# Patient Record
Sex: Female | Born: 1970 | Race: White | Hispanic: No | Smoking: Former smoker
Health system: Southern US, Community
[De-identification: ages and names within clinical notes are randomized; demographics above are authoritative.]

## PROBLEM LIST (undated history)

## (undated) DIAGNOSIS — F419 Anxiety disorder, unspecified: Secondary | ICD-10-CM

## (undated) DIAGNOSIS — C801 Malignant (primary) neoplasm, unspecified: Secondary | ICD-10-CM

## (undated) DIAGNOSIS — F410 Panic disorder [episodic paroxysmal anxiety] without agoraphobia: Secondary | ICD-10-CM

## (undated) DIAGNOSIS — T8859XA Other complications of anesthesia, initial encounter: Secondary | ICD-10-CM

## (undated) DIAGNOSIS — R112 Nausea with vomiting, unspecified: Secondary | ICD-10-CM

## (undated) DIAGNOSIS — J449 Chronic obstructive pulmonary disease, unspecified: Secondary | ICD-10-CM

## (undated) DIAGNOSIS — K219 Gastro-esophageal reflux disease without esophagitis: Secondary | ICD-10-CM

## (undated) DIAGNOSIS — R519 Headache, unspecified: Secondary | ICD-10-CM

## (undated) DIAGNOSIS — I219 Acute myocardial infarction, unspecified: Secondary | ICD-10-CM

## (undated) DIAGNOSIS — F32A Depression, unspecified: Secondary | ICD-10-CM

## (undated) DIAGNOSIS — F329 Major depressive disorder, single episode, unspecified: Secondary | ICD-10-CM

## (undated) DIAGNOSIS — J45909 Unspecified asthma, uncomplicated: Secondary | ICD-10-CM

## (undated) DIAGNOSIS — Z9889 Other specified postprocedural states: Secondary | ICD-10-CM

## (undated) HISTORY — PX: TUBAL LIGATION: SHX77

## (undated) HISTORY — PX: WRIST SURGERY: SHX841

---

## 2009-09-23 ENCOUNTER — Inpatient Hospital Stay (HOSPITAL_COMMUNITY): Admission: EM | Admit: 2009-09-23 | Discharge: 2009-09-24 | Payer: Self-pay | Admitting: Emergency Medicine

## 2011-02-05 LAB — COMPREHENSIVE METABOLIC PANEL
AST: 18 U/L (ref 0–37)
Albumin: 3.7 g/dL (ref 3.5–5.2)
Chloride: 109 mEq/L (ref 96–112)
Creatinine, Ser: 0.84 mg/dL (ref 0.4–1.2)
GFR calc Af Amer: >60 mL/min (ref >60)
Total Bilirubin: 0.7 mg/dL (ref 0.3–1.2)
Total Protein: 6.4 g/dL (ref 6.0–8.3)

## 2011-02-05 LAB — DIFFERENTIAL
Basophils Absolute: 0 10*3/uL (ref 0.0–0.1)
Eosinophils Relative: 1 % (ref 0–5)
Lymphocytes Relative: 17 % (ref 12–46)
Lymphs Abs: 1.5 10*3/uL (ref 0.7–4.0)
Monocytes Absolute: 0.4 10*3/uL (ref 0.1–1.0)
Monocytes Relative: 4 % (ref 3–12)

## 2011-02-05 LAB — CBC
MCV: 94.5 fL (ref 78.0–100.0)
Platelets: 256 10*3/uL (ref 150–400)
RDW: 13.2 % (ref 11.5–15.5)
WBC: 8.6 10*3/uL (ref 4.0–10.5)

## 2011-02-05 LAB — APTT: aPTT: 25 seconds (ref 24–37)

## 2016-05-11 ENCOUNTER — Encounter (HOSPITAL_COMMUNITY): Payer: Self-pay | Admitting: Emergency Medicine

## 2016-05-11 ENCOUNTER — Emergency Department (HOSPITAL_COMMUNITY)
Admission: EM | Admit: 2016-05-11 | Discharge: 2016-05-11 | Disposition: A | Discharge status: Home / Self Care | Payer: Managed Care, Other (non HMO) | Attending: Emergency Medicine | Admitting: Emergency Medicine

## 2016-05-11 DIAGNOSIS — F17210 Nicotine dependence, cigarettes, uncomplicated: Secondary | ICD-10-CM | POA: Diagnosis not present

## 2016-05-11 DIAGNOSIS — R197 Diarrhea, unspecified: Secondary | ICD-10-CM | POA: Insufficient documentation

## 2016-05-11 DIAGNOSIS — R1084 Generalized abdominal pain: Secondary | ICD-10-CM | POA: Insufficient documentation

## 2016-05-11 DIAGNOSIS — Z79899 Other long term (current) drug therapy: Secondary | ICD-10-CM | POA: Diagnosis not present

## 2016-05-11 HISTORY — DX: Anxiety disorder, unspecified: F41.9

## 2016-05-11 LAB — COMPREHENSIVE METABOLIC PANEL
ALBUMIN: 4.4 g/dL (ref 3.5–5.0)
ALT: 14 U/L (ref 14–54)
AST: 17 U/L (ref 15–41)
Alkaline Phosphatase: 76 U/L (ref 38–126)
Anion gap: 6 (ref 5–15)
BUN: 19 mg/dL (ref 6–20)
CHLORIDE: 102 mmol/L (ref 101–111)
CO2: 25 mmol/L (ref 22–32)
Calcium: 8.7 mg/dL — ABNORMAL LOW (ref 8.9–10.3)
Creatinine, Ser: 1.09 mg/dL — ABNORMAL HIGH (ref 0.44–1.00)
GFR calc Af Amer: >60 mL/min (ref >60)
Glucose, Bld: 91 mg/dL (ref 65–99)
POTASSIUM: 4.1 mmol/L (ref 3.5–5.1)
SODIUM: 133 mmol/L — AB (ref 135–145)
Total Bilirubin: 1 mg/dL (ref 0.3–1.2)
Total Protein: 7.9 g/dL (ref 6.5–8.1)

## 2016-05-11 LAB — CBC WITH DIFFERENTIAL/PLATELET
BASOS ABS: 0 10*3/uL (ref 0.0–0.1)
BASOS PCT: 0 %
EOS ABS: 0.1 10*3/uL (ref 0.0–0.7)
EOS PCT: 1 %
HCT: 43.7 % (ref 36.0–46.0)
Hemoglobin: 15.4 g/dL — ABNORMAL HIGH (ref 12.0–15.0)
Lymphocytes Relative: 22 %
Lymphs Abs: 1.3 10*3/uL (ref 0.7–4.0)
MCH: 32.6 pg (ref 26.0–34.0)
MCHC: 35.2 g/dL (ref 30.0–36.0)
MCV: 92.6 fL (ref 78.0–100.0)
MONO ABS: 0.4 10*3/uL (ref 0.1–1.0)
Monocytes Relative: 7 %
Neutro Abs: 4 10*3/uL (ref 1.7–7.7)
Neutrophils Relative %: 70 %
PLATELETS: 229 10*3/uL (ref 150–400)
RBC: 4.72 MIL/uL (ref 3.87–5.11)
RDW: 13.3 % (ref 11.5–15.5)
WBC: 5.7 10*3/uL (ref 4.0–10.5)

## 2016-05-11 MED ORDER — DIPHENOXYLATE-ATROPINE 2.5-0.025 MG PO TABS: 1.0000 | ORAL_TABLET | Freq: Four times a day (QID) | ORAL | Status: DC | PRN

## 2016-05-11 MED ORDER — ONDANSETRON HCL 4 MG/2ML IJ SOLN
4.0000 mg | Freq: Once | INTRAMUSCULAR | Status: AC
  Administered 2016-05-11: 4 mg via INTRAVENOUS
  Filled 2016-05-11: qty 2

## 2016-05-11 MED ORDER — SODIUM CHLORIDE 0.9 % IV BOLUS (SEPSIS)
1000.0000 mL | Freq: Once | INTRAVENOUS | Status: AC
  Administered 2016-05-11: 1000 mL via INTRAVENOUS

## 2016-05-11 MED ORDER — ONDANSETRON HCL 8 MG PO TABS: 8.0000 mg | ORAL_TABLET | ORAL | Status: DC | PRN

## 2016-05-11 MED ORDER — MORPHINE SULFATE (PF) 4 MG/ML IV SOLN
4.0000 mg | Freq: Once | INTRAVENOUS | Status: AC
  Administered 2016-05-11: 4 mg via INTRAVENOUS
  Filled 2016-05-11: qty 1

## 2016-05-11 NOTE — ED Notes (Signed)
Pt reports diarrhea x5 days one month ago, dark black in color.  Pt went to gastro doctor to have EGD and colonoscopy and no significant findings.  Pt woke up yesterday and has had 40 episodes of diarrhea, has indigestion, fatigue, wt loss. Pt having generalized cramping in abdomen and back pain. Pt denies antibiotic usage.

## 2016-05-11 NOTE — ED Provider Notes (Addendum)
History  By signing my name below, I, Bea Graff, attest that this documentation has been prepared under the direction and in the presence of Nat Christen, MD. Electronically Signed: Bea Graff, ED Scribe. 05/11/2016. 12:32 PM.  Chief Complaint  Patient presents with  . Diarrhea   The history is provided by the patient and medical records. No language interpreter was used.    HPI Comments:  Vicki Mcmillan is a 45 y.o. female who presents to the Emergency Department complaining of diarrhea onset yesterday. She reports the stool started loose and brown but after about 40-50 episodes it became watery. Pt reports generalized weakness yesterday. She reports cramping abdominal pain today. She states she had a diet soda and two popscicles yesterday. She denies modifying factors. Pt reports having black diarrhea for 5 days about one month ago and was sent to a gastroenterologist. She had a normal EGD and colonoscopy in the past month and was diagnosed with IBS. She denies fever, chills, vomiting, hematochezia. PCP is Dr. Dema Severin in [place]. She denies any new or recent antibiotic use since the procedures.  Past Medical History  Diagnosis Date  . Anxiety    Past Surgical History  Procedure Laterality Date  . Wrist surgery     History reviewed. No pertinent family history. Social History  Substance Use Topics  . Smoking status: Current Every Day Smoker -- 1.00 packs/day    Types: Cigarettes  . Smokeless tobacco: None  . Alcohol Use: No   OB History    No data available     Review of Systems  A complete 10 system review of systems was obtained and all systems are negative except as noted in the HPI and PMH.   Allergies  Chantix and Wellbutrin  Home Medications   Prior to Admission medications   Medication Sig Start Date End Date Taking? Authorizing Provider  ALPRAZolam Duanne Moron) 0.5 MG tablet Take 0.5 mg by mouth daily as needed for anxiety.   Yes Historical Provider, MD   dexlansoprazole (DEXILANT) 60 MG capsule Take 120 mg by mouth 2 (two) times daily.   Yes Historical Provider, MD  norethindrone (AYGESTIN) 5 MG tablet Take 5 mg by mouth daily.   Yes Historical Provider, MD  omeprazole (PRILOSEC) 40 MG capsule Take 40 mg by mouth 2 (two) times daily.   Yes Historical Provider, MD  sertraline (ZOLOFT) 50 MG tablet Take 50 mg by mouth daily.   Yes Historical Provider, MD  diphenoxylate-atropine (LOMOTIL) 2.5-0.025 MG tablet Take 1 tablet by mouth 4 (four) times daily as needed for diarrhea or loose stools. 05/11/16   Nat Christen, MD  ondansetron (ZOFRAN) 8 MG tablet Take 1 tablet (8 mg total) by mouth every 4 (four) hours as needed. 05/11/16   Nat Christen, MD   Triage Vitals: BP 133/107 mmHg  Pulse 97  Temp(Src) 98.8 F (37.1 C) (Oral)  Resp 18  Ht 5\' 1"  (1.549 m)  Wt 109 lb (49.442 kg)  BMI 20.61 kg/m2  SpO2 98% Physical Exam  Constitutional: She is oriented to person, place, and time. She appears well-developed and well-nourished.  HENT:  Head: Normocephalic and atraumatic.  Eyes: Conjunctivae are normal.  Neck: Neck supple.  Cardiovascular: Normal rate and regular rhythm.   Pulmonary/Chest: Effort normal and breath sounds normal.  Abdominal: Soft. Bowel sounds are normal. There is tenderness (minimal diffuse).  Musculoskeletal: Normal range of motion.  Neurological: She is alert and oriented to person, place, and time.  Skin: Skin is warm and  dry.  Psychiatric: She has a normal mood and affect. Her behavior is normal.  Nursing note and vitals reviewed.   ED Course  Procedures (including critical care time) DIAGNOSTIC STUDIES: Oxygen Saturation is 98% on RA, normal by my interpretation.   COORDINATION OF CARE: 11:46 AM- Will order IV fluids and nausea medication. Pt verbalizes understanding and agrees to plan.  Medications  sodium chloride 0.9 % bolus 1,000 mL (1,000 mLs Intravenous New Bag/Given 05/11/16 1219)  ondansetron (ZOFRAN) injection 4 mg  (4 mg Intravenous Given 05/11/16 1219)  sodium chloride 0.9 % bolus 1,000 mL (1,000 mLs Intravenous New Bag/Given 05/11/16 1219)  morphine 4 MG/ML injection 4 mg (4 mg Intravenous Given 05/11/16 1220)    Labs Review Labs Reviewed  CBC WITH DIFFERENTIAL/PLATELET - Abnormal; Notable for the following:    Hemoglobin 15.4 (*)    All other components within normal limits  COMPREHENSIVE METABOLIC PANEL - Abnormal; Notable for the following:    Sodium 133 (*)    Creatinine, Ser 1.09 (*)    Calcium 8.7 (*)    All other components within normal limits  URINALYSIS, ROUTINE W REFLEX MICROSCOPIC (NOT AT [facility])    Imaging Review No results found. I have personally reviewed and evaluated these images and lab results as part of my medical decision-making.   EKG Interpretation None      MDM   Final diagnoses:  Diarrhea, unspecified type    No gross diarrhea noted in the emergency department. Patient is hemodynamically stable. Screening labs were acceptable. She feels better after 2 L of IV fluids, IV Zofran, IV morphine. Discharge medications Zofran 8 mg and Lomotil.  I personally performed the services described in this documentation, which was scribed in my presence. The recorded information has been reviewed and is accurate.      Nat Christen, MD 05/11/16 [place], MD 05/14/16 [phone]

## 2016-05-11 NOTE — Discharge Instructions (Signed)
Prescription for nausea medicine and diarrhea medicine. Follow-up your gastroenterologist.

## 2016-07-23 DIAGNOSIS — F411 Generalized anxiety disorder: Secondary | ICD-10-CM | POA: Insufficient documentation

## 2016-07-23 DIAGNOSIS — F323 Major depressive disorder, single episode, severe with psychotic features: Secondary | ICD-10-CM | POA: Insufficient documentation

## 2018-09-08 ENCOUNTER — Observation Stay (HOSPITAL_COMMUNITY)
Admission: EM | Admit: 2018-09-08 | Discharge: 2018-09-09 | Disposition: A | Discharge status: Home / Self Care | Payer: Self-pay | Attending: Family Medicine | Admitting: Family Medicine

## 2018-09-08 ENCOUNTER — Other Ambulatory Visit: Payer: Self-pay

## 2018-09-08 ENCOUNTER — Emergency Department (HOSPITAL_COMMUNITY): Payer: Self-pay

## 2018-09-08 ENCOUNTER — Encounter: Payer: Self-pay | Admitting: Emergency Medicine

## 2018-09-08 DIAGNOSIS — Z79899 Other long term (current) drug therapy: Secondary | ICD-10-CM | POA: Insufficient documentation

## 2018-09-08 DIAGNOSIS — J449 Chronic obstructive pulmonary disease, unspecified: Secondary | ICD-10-CM | POA: Insufficient documentation

## 2018-09-08 DIAGNOSIS — Z720 Tobacco use: Secondary | ICD-10-CM | POA: Diagnosis present

## 2018-09-08 DIAGNOSIS — F419 Anxiety disorder, unspecified: Secondary | ICD-10-CM | POA: Diagnosis present

## 2018-09-08 DIAGNOSIS — R079 Chest pain, unspecified: Secondary | ICD-10-CM | POA: Insufficient documentation

## 2018-09-08 DIAGNOSIS — K219 Gastro-esophageal reflux disease without esophagitis: Secondary | ICD-10-CM | POA: Insufficient documentation

## 2018-09-08 DIAGNOSIS — F32A Depression, unspecified: Secondary | ICD-10-CM | POA: Diagnosis present

## 2018-09-08 DIAGNOSIS — E876 Hypokalemia: Secondary | ICD-10-CM | POA: Diagnosis present

## 2018-09-08 DIAGNOSIS — Z23 Encounter for immunization: Secondary | ICD-10-CM | POA: Insufficient documentation

## 2018-09-08 DIAGNOSIS — I200 Unstable angina: Secondary | ICD-10-CM | POA: Diagnosis present

## 2018-09-08 DIAGNOSIS — F329 Major depressive disorder, single episode, unspecified: Principal | ICD-10-CM | POA: Insufficient documentation

## 2018-09-08 DIAGNOSIS — F17210 Nicotine dependence, cigarettes, uncomplicated: Secondary | ICD-10-CM | POA: Insufficient documentation

## 2018-09-08 HISTORY — DX: Depression, unspecified: F32.A

## 2018-09-08 HISTORY — DX: Panic disorder (episodic paroxysmal anxiety): F41.0

## 2018-09-08 HISTORY — DX: Major depressive disorder, single episode, unspecified: F32.9

## 2018-09-08 HISTORY — DX: Chronic obstructive pulmonary disease, unspecified: J44.9

## 2018-09-08 LAB — BASIC METABOLIC PANEL
Anion gap: 8 (ref 5–15)
BUN: 12 mg/dL (ref 6–20)
CHLORIDE: 106 mmol/L (ref 98–111)
CO2: 27 mmol/L (ref 22–32)
CREATININE: 0.78 mg/dL (ref 0.44–1.00)
Calcium: 9.3 mg/dL (ref 8.9–10.3)
GFR calc Af Amer: >60 mL/min (ref >60)
GFR calc non Af Amer: >60 mL/min (ref >60)
GLUCOSE: 85 mg/dL (ref 70–99)
Potassium: 3.2 mmol/L — ABNORMAL LOW (ref 3.5–5.1)
Sodium: 141 mmol/L (ref 135–145)

## 2018-09-08 LAB — PROTIME-INR
INR: 1.07
Prothrombin Time: 13.8 seconds (ref 11.4–15.2)

## 2018-09-08 LAB — APTT: aPTT: >200 seconds (ref 24–36)

## 2018-09-08 LAB — PHOSPHORUS: Phosphorus: 3.2 mg/dL (ref 2.5–4.6)

## 2018-09-08 LAB — CBC
HEMATOCRIT: 47.4 % — AB (ref 36.0–46.0)
Hemoglobin: 15 g/dL (ref 12.0–15.0)
MCH: 30.4 pg (ref 26.0–34.0)
MCHC: 31.6 g/dL (ref 30.0–36.0)
MCV: 96.1 fL (ref 80.0–100.0)
Platelets: 268 10*3/uL (ref 150–400)
RBC: 4.93 MIL/uL (ref 3.87–5.11)
RDW: 13.6 % (ref 11.5–15.5)
WBC: 6.5 10*3/uL (ref 4.0–10.5)
nRBC: 0 % (ref 0.0–0.2)

## 2018-09-08 LAB — MAGNESIUM: Magnesium: 2.2 mg/dL (ref 1.7–2.4)

## 2018-09-08 LAB — TROPONIN I
Troponin I: <0.03 ng/mL (ref <0.03)
Troponin I: <0.03 ng/mL (ref <0.03)

## 2018-09-08 MED ORDER — ASPIRIN 81 MG PO CHEW
324.0000 mg | CHEWABLE_TABLET | Freq: Once | ORAL | Status: AC
  Administered 2018-09-08: 324 mg via ORAL
  Filled 2018-09-08: qty 4

## 2018-09-08 MED ORDER — INFLUENZA VAC SPLIT QUAD 0.5 ML IM SUSY
0.5000 mL | PREFILLED_SYRINGE | INTRAMUSCULAR | Status: AC
  Administered 2018-09-09: 0.5 mL via INTRAMUSCULAR

## 2018-09-08 MED ORDER — PNEUMOCOCCAL VAC POLYVALENT 25 MCG/0.5ML IJ INJ
0.5000 mL | INJECTION | INTRAMUSCULAR | Status: AC
  Administered 2018-09-09: 0.5 mL via INTRAMUSCULAR

## 2018-09-08 MED ORDER — SERTRALINE HCL 50 MG PO TABS
200.0000 mg | ORAL_TABLET | Freq: Every day | ORAL | Status: DC
  Administered 2018-09-09: 200 mg via ORAL
  Filled 2018-09-08: qty 4

## 2018-09-08 MED ORDER — POTASSIUM CHLORIDE CRYS ER 20 MEQ PO TBCR
40.0000 meq | EXTENDED_RELEASE_TABLET | Freq: Once | ORAL | Status: AC
  Administered 2018-09-09: 40 meq via ORAL
  Filled 2018-09-08: qty 2

## 2018-09-08 MED ORDER — FAMOTIDINE 20 MG PO TABS
20.0000 mg | ORAL_TABLET | Freq: Two times a day (BID) | ORAL | Status: DC
  Administered 2018-09-09 (×2): 20 mg via ORAL
  Filled 2018-09-08 (×2): qty 1

## 2018-09-08 MED ORDER — KETOROLAC TROMETHAMINE 30 MG/ML IJ SOLN
15.0000 mg | Freq: Once | INTRAMUSCULAR | Status: AC
  Administered 2018-09-08: 15 mg via INTRAVENOUS
  Filled 2018-09-08: qty 1

## 2018-09-08 MED ORDER — METOCLOPRAMIDE HCL 5 MG/ML IJ SOLN
10.0000 mg | Freq: Once | INTRAMUSCULAR | Status: AC
  Administered 2018-09-08: 10 mg via INTRAVENOUS
  Filled 2018-09-08: qty 2

## 2018-09-08 MED ORDER — MAGNESIUM SULFATE 2 GM/50ML IV SOLN
2.0000 g | Freq: Once | INTRAVENOUS | Status: AC
  Administered 2018-09-09: 2 g via INTRAVENOUS
  Filled 2018-09-08: qty 50

## 2018-09-08 MED ORDER — IPRATROPIUM-ALBUTEROL 0.5-2.5 (3) MG/3ML IN SOLN
3.0000 mL | Freq: Four times a day (QID) | RESPIRATORY_TRACT | Status: DC | PRN
  Administered 2018-09-08: 3 mL via RESPIRATORY_TRACT
  Filled 2018-09-08 (×2): qty 3

## 2018-09-08 MED ORDER — HEPARIN BOLUS VIA INFUSION
3000.0000 [IU] | Freq: Once | INTRAVENOUS | Status: AC
  Administered 2018-09-08: 3000 [IU] via INTRAVENOUS

## 2018-09-08 MED ORDER — ALPRAZOLAM 0.5 MG PO TABS: 0.5000 mg | ORAL_TABLET | Freq: Every day | ORAL | Status: DC | PRN

## 2018-09-08 MED ORDER — HEPARIN (PORCINE) 25000 UT/250ML-% IV SOLN
750.0000 [IU]/h | INTRAVENOUS | Status: DC
  Administered 2018-09-08: 650 [IU]/h via INTRAVENOUS
  Filled 2018-09-08: qty 250

## 2018-09-08 MED ORDER — NITROGLYCERIN 0.4 MG SL SUBL
0.4000 mg | SUBLINGUAL_TABLET | SUBLINGUAL | Status: DC | PRN
  Administered 2018-09-08: 0.4 mg via SUBLINGUAL
  Filled 2018-09-08: qty 1

## 2018-09-08 NOTE — Progress Notes (Signed)
ANTICOAGULATION CONSULT NOTE - Initial Consult  Pharmacy Consult for Heparin Indication: chest pain/ACS  Allergies  Allergen Reactions  . Chantix [Varenicline]     hallucination  . Wellbutrin [Bupropion]     hallucinations    Patient Measurements: Height: 5\' 4"  (162.6 cm) Weight: 104 lb (47.2 kg) IBW/kg (Calculated) : 54.7 HEPARIN DW (KG): 47.2   Vital Signs: Temp: 97.6 F (36.4 C) (11/06 1403) Temp Source: Oral (11/06 1403) BP: 110/85 (11/06 1730) Pulse Rate: 79 (11/06 1730)  Labs: Recent Labs    09/08/18 1411  HGB 15.0  HCT 47.4*  PLT 268  CREATININE 0.78  TROPONINI <0.03    Estimated Creatinine Clearance: 64.1 mL/min (by C-G formula based on SCr of 0.78 mg/dL).   Medical History: Past Medical History:  Diagnosis Date  . Anxiety   . COPD (chronic obstructive pulmonary disease) ([place])   . Depression   . Panic attacks     Medications:   (Not in a hospital admission)  Home med list pending, not on anticoagulant PTA per MD note.  Assessment: Okay for Protocol, baseline anticoag labs pending.  Goal of Therapy:  Heparin level 0.3-0.7 units/ml Monitor platelets by anticoagulation protocol: Yes   Plan:  Give 3000 units bolus x 1 Start heparin infusion at 650 units/hr Check anti-Xa level in 6-8 hours and daily while on heparin Continue to monitor H&H and platelets  Pricilla Larsson 09/08/2018,6:39 PM

## 2018-09-08 NOTE — ED Notes (Signed)
Date and time results received: 09/08/18 . Now  (use smartphrase ".now" to insert current time)  Test: APTT  Critical Value: >200   Name of Provider Notified: nanavati   Orders Received? Or Actions Taken?: none at this time

## 2018-09-08 NOTE — ED Notes (Signed)
Family at bedside. 

## 2018-09-08 NOTE — H&P (Signed)
History and Physical    Marea Edelson AST:[id number] DOB: 09/30/1971 DOA: 09/08/2018  PCP: [name], NP   Patient coming from: Home.  I have personally briefly reviewed patient's old medical records in [place]  Chief Complaint: Chest pain.  HPI: Vicki Mcmillan is a 47 y.o. female with medical history significant of anxiety, depression, panic attacks COPD, active smoker who is coming with complaints of left-sided chest pain radiated to her LUE associated with lightheadedness and mild diaphoresis defers happened yesterday late afternoon that started while she was watching TV.  The pain lasted for several minutes and eased off, but then most of the evening she had the pain on and off.  She was able to go to sleep, but woke up with palpitations in the morning.  However, she mentions that she frequently has early morning palpitations are usually resolve after she gets out of bed and starts being mildly active.  She denies nausea, emesis, PND, orthopnea or pitting edema of the lower extremities.  She smokes about half a pack a day for 3 years.  She has a 10-pack-year history of smoking.  She denies alcohol or recreational drug use.  She gets wheezing and dyspnea frequently.  She uses bronchodilators daily.  She denies fever, chills, sore throat, hemoptysis, diarrhea, constipation, melena or hematochezia.  No dysuria, frequency or hematuria.  The patient states that she is heat intolerant at night, but thinks she may be having some hot flashes.  She denies polyuria, polydipsia, polyphagia or blurred vision.  ED Course: Initial vital signs temperature 97.6 F, pulse 104, respirations 18, blood pressure 121/91 mmHg and O2 sat 100% on room air.  The patient received sublingual nitroglycerin in the emergency department which relieved her chest pain, but gave her a headache.  Toradol 30 mg IVP x1 given and she denies headache at this time.  She was put on heparin infusion after the case was  discussed with cardiology given her EKG changes.  Her CBC was unremarkable.  PT/INR within normal limits. PTT was more than 200 seconds, but heparin infusion had already been started. BMP was normal, except for potassium of 3.2 mmol/L.  EKG was sinus tachycardia with right atrial enlargement, inferior and lateral Q waves that were new when compared to last EKG in 2010.  Troponin x2 were normal.  Her chest radiograph showed emphysema.  Review of Systems: As per HPI otherwise 10 point review of systems negative.   Past Medical History:  Diagnosis Date  . Anxiety   . COPD (chronic obstructive pulmonary disease) ([place])   . Depression   . Panic attacks   . Panic attacks     Past Surgical History:  Procedure Laterality Date  . WRIST SURGERY       reports that she has been smoking cigarettes. She has been smoking about 0.50 packs per day. She has never used smokeless tobacco. She reports that she does not drink alcohol or use drugs.  Allergies  Allergen Reactions  . Hydrocodone-Acetaminophen Nausea And Vomiting  . Chantix [Varenicline]     hallucination  . Oxycodone Nausea And Vomiting and Other (See Comments)    Stomach upset  . Wellbutrin [Bupropion]     hallucinations   Family history  Sister COPD Maternal on COPD  Prior to Admission medications   Medication Sig Start Date End Date Taking? Authorizing Provider  albuterol (PROVENTIL HFA;VENTOLIN HFA) 108 (90 Base) MCG/ACT inhaler Inhale 1-2 puffs into the lungs every 6 (six) hours as needed  for wheezing or shortness of breath.   Yes [provider]  ALPRAZolam Duanne Moron) 0.5 MG tablet Take 0.5 mg by mouth daily as needed for anxiety.   Yes [provider]  ipratropium-albuterol (DUONEB) 0.5-2.5 (3) MG/3ML SOLN Take 3 mLs by nebulization 2 (two) times daily.   Yes [provider]  sertraline (ZOLOFT) 100 MG tablet Take 200 mg by mouth daily.    Yes [provider]    Physical Exam: Vitals:    09/08/18 2200 09/08/18 2215 09/08/18 2230 09/08/18 2245  BP: 116/81  112/88   Pulse: 84 86 76 79  Resp:      Temp:      TempSrc:      SpO2: 97% 97% 96% 96%  Weight:      Height:        Constitutional: NAD, calm, comfortable.  Eyes: PERRL, lids and conjunctivae normal.  ENMT: Mucous membranes are moist. Posterior pharynx clear of any exudate or lesions. Neck: Normal, supple, no masses, no thyromegaly. Respiratory: Clear to auscultation bilaterally, no wheezing, no crackles. Normal respiratory effort. No accessory muscle use.  Cardiovascular: Regular rate and rhythm, no murmurs / rubs / gallops. No extremity edema. 2+ pedal pulses. No carotid bruits.  Abdomen: no tenderness, no masses palpated. No hepatosplenomegaly. Bowel sounds positive.  Musculoskeletal: no clubbing / cyanosis. Good ROM, no contractures. Normal muscle tone.  Skin: no rashes, lesions, ulcers. No induration on very limited dermatological exam. Neurologic: CN 2-12 grossly intact. Sensation intact, DTR normal. Strength 5/5 in all 4.  Psychiatric: Normal judgment and insight. Alert and oriented x 4. Normal mood.    Labs on Admission: I have personally reviewed following labs and imaging studies  CBC: Recent Labs  Lab 09/08/18 1411  WBC 6.5  HGB 15.0  HCT 47.4*  MCV 96.1  PLT 811   Basic Metabolic Panel: Recent Labs  Lab 09/08/18 1411 09/08/18 2159  NA 141  --   K 3.2*  --   CL 106  --   CO2 27  --   GLUCOSE 85  --   BUN 12  --   CREATININE 0.78  --   CALCIUM 9.3  --   MG  --  2.2  PHOS  --  3.2   GFR: Estimated Creatinine Clearance: 64.1 mL/min (by C-G formula based on SCr of 0.78 mg/dL). Liver Function Tests: No results for input(s): AST, ALT, ALKPHOS, BILITOT, PROT, ALBUMIN in the last 168 hours. No results for input(s): LIPASE, AMYLASE in the last 168 hours. No results for input(s): AMMONIA in the last 168 hours. Coagulation Profile: Recent Labs  Lab 09/08/18 1956  INR 1.07   Cardiac  Enzymes: Recent Labs  Lab 09/08/18 1411 09/08/18 2159  TROPONINI <0.03 <0.03   BNP (last 3 results) No results for input(s): PROBNP in the last 8760 hours. HbA1C: No results for input(s): HGBA1C in the last 72 hours. CBG: No results for input(s): GLUCAP in the last 168 hours. Lipid Profile: No results for input(s): CHOL, HDL, LDLCALC, TRIG, CHOLHDL, LDLDIRECT in the last 72 hours. Thyroid Function Tests: No results for input(s): TSH, T4TOTAL, FREET4, T3FREE, THYROIDAB in the last 72 hours. Anemia Panel: No results for input(s): VITAMINB12, FOLATE, FERRITIN, TIBC, IRON, RETICCTPCT in the last 72 hours. Urine analysis: No results found for: COLORURINE, APPEARANCEUR, LABSPEC, PHURINE, GLUCOSEU, HGBUR, BILIRUBINUR, KETONESUR, PROTEINUR, UROBILINOGEN, NITRITE, LEUKOCYTESUR  Radiological Exams on Admission: Dg Chest 2 View  Result Date: 09/08/2018 CLINICAL DATA:  Chest pain with LEFT arm  numbness.  COPD. EXAM: CHEST - 2 VIEW COMPARISON:  08/16/2017. FINDINGS: COPD with hyperinflation. Normal cardiomediastinal silhouette. No consolidation or edema. No effusion or pneumothorax. Bones unremarkable. IMPRESSION: COPD.  No active disease.  Similar appearance to priors. Electronically Signed   By: Staci Righter M.D.   On: 09/08/2018 14:48    EKG: Independently reviewed.  Vent. rate 105 BPM PR interval * ms QRS duration 99 ms QT/QTc 356/471 ms P-R-T axes 78 81 64 Sinus tachycardia Right atrial enlargement inferior and lateral q waves are new compared to 2010  Assessment/Plan Principal Problem:   Unstable angina ([place]) Initially was going to be transferred to [facility]. Cardiology on-call stated that she could stay to [facility] and be seen in the morning.   Observation/telemetry. EKG show changes when compared to the one from 2010. Continue heparin infusion. Trend troponin level. Get echocardiogram in the morning. Cardiology evaluation in a.m.  Active Problems:   Depression    Anxiety Continue sertraline 200 mg p.o. daily. Continue alprazolam 0.5 mg p.o. as needed daily. Alprazolam 0.5 mg p.o. nightly while in the hospital.    Tobacco use Nicotine replacement therapy ordered. The patient was encouraged multiple times to cease smoking.    COPD (chronic obstructive pulmonary disease) ([facility]) Supplemental oxygen and bronchodilators as needed. Given family history of significant for severe COPD, will check alpha-1 antitrypsin. The patient should follow-up with pulmonology.    Hypokalemia Replaced. Magnesium sulfate supplementation was given. Follow-up level.   DVT prophylaxis: Heparin SQ. Code Status: Full code. Family Communication:  Disposition Plan: Observation/ Consults called: Routine cardiology consult. Admission status: Observation/telemetry.   Reubin Milan MD [facility] Pager [phone].  If 7PM-7AM, please contact night-coverage [email or web address] Password [email or web address]  09/08/2018, 11:40 PM

## 2018-09-08 NOTE — ED Notes (Signed)
ED Provider at bedside. 

## 2018-09-08 NOTE — ED Triage Notes (Signed)
Pt with mid cp sharp and stabbing for past 30 min, no hx of cp in past, + sob, denies N/V, HA yesterday, mild at this time. Pt with hx of COPD

## 2018-09-08 NOTE — ED Provider Notes (Signed)
[facility] EMERGENCY DEPARTMENT Provider Note   CSN: [id number] Arrival date & time: 09/08/18  1353     History   Chief Complaint Chief Complaint  Patient presents with  . Chest Pain    HPI Vicki Mcmillan is a 47 y.o. female.  HPI 47 year old female with history of COPD and heavy smoking comes in with chief complaint of chest pain. Patient reports that she started having intermittent chest pain last night.  However earlier today her pain became constant and severe.  Pain is described as anterior generalized chest pain that is described as heaviness.  Patient reports that her pain is radiating down her left arm and she has associated numbness in her left upper extremity.  Patient denies any history of similar pain in the past.  She has no history of cardiac work-up. Pt has no hx of PE, DVT and denies any exogenous hormone (testosterone / estrogen) use, long distance travels or surgery in the past 6 weeks, active cancer, recent immobilization.   Past Medical History:  Diagnosis Date  . Anxiety   . COPD (chronic obstructive pulmonary disease) ([place])   . Depression   . Panic attacks     There are no active problems to display for this patient.   Past Surgical History:  Procedure Laterality Date  . WRIST SURGERY       OB History   None      Home Medications    Prior to Admission medications   Medication Sig Start Date End Date Taking? Authorizing Provider  ALPRAZolam Duanne Moron) 0.5 MG tablet Take 0.5 mg by mouth daily as needed for anxiety.    [provider]  dexlansoprazole (DEXILANT) 60 MG capsule Take 120 mg by mouth 2 (two) times daily.    [provider]  diphenoxylate-atropine (LOMOTIL) 2.5-0.025 MG tablet Take 1 tablet by mouth 4 (four) times daily as needed for diarrhea or loose stools. 05/11/16   Nat Christen, MD  norethindrone (AYGESTIN) 5 MG tablet Take 5 mg by mouth daily.    [provider]  omeprazole (PRILOSEC) 40 MG capsule  Take 40 mg by mouth 2 (two) times daily.    [provider]  ondansetron (ZOFRAN) 8 MG tablet Take 1 tablet (8 mg total) by mouth every 4 (four) hours as needed. 05/11/16   Nat Christen, MD  sertraline (ZOLOFT) 50 MG tablet Take 50 mg by mouth daily.    [provider]    Family History History reviewed. No pertinent family history.  Social History Social History   Tobacco Use  . Smoking status: Current Every Day Smoker    Packs/day: 0.50    Types: Cigarettes  . Smokeless tobacco: Never Used  Substance Use Topics  . Alcohol use: No  . Drug use: No     Allergies   Chantix [varenicline] and Wellbutrin [bupropion]   Review of Systems Review of Systems  Constitutional: Positive for activity change. Negative for diaphoresis.  Respiratory: Positive for shortness of breath.   Cardiovascular: Positive for chest pain.  Gastrointestinal: Positive for nausea.  Hematological: Does not bruise/bleed easily.  All other systems reviewed and are negative.    Physical Exam Updated Vital Signs BP 110/85   Pulse 79   Temp 97.6 F (36.4 C) (Oral)   Resp (!) 25   Ht 5\' 4"  (1.626 m)   Wt 47.2 kg   SpO2 100%   BMI 17.85 kg/m   Physical Exam  Constitutional: She is oriented to person, place, and  time. She appears well-developed.  HENT:  Head: Normocephalic and atraumatic.  Eyes: EOM are normal.  Neck: Normal range of motion. Neck supple.  Cardiovascular: Normal rate, intact distal pulses and normal pulses.  Pulses:      Radial pulses are 2+ on the right side, and 2+ on the left side.  Pulmonary/Chest: Effort normal.  Abdominal: Bowel sounds are normal.  Musculoskeletal:       Right lower leg: She exhibits no tenderness and no edema.       Left lower leg: She exhibits no tenderness and no edema.  Neurological: She is alert and oriented to person, place, and time.  Skin: Skin is warm and dry.  Nursing note and vitals reviewed.    ED Treatments / Results    Labs (all labs ordered are listed, but only abnormal results are displayed) Labs Reviewed  BASIC METABOLIC PANEL - Abnormal; Notable for the following components:      Result Value   Potassium 3.2 (*)    All other components within normal limits  CBC - Abnormal; Notable for the following components:   HCT 47.4 (*)    All other components within normal limits  TROPONIN I    EKG EKG Interpretation  Date/Time:  Wednesday September 08 2018 14:06:55 EST Ventricular Rate:  105 PR Interval:    QRS Duration: 99 QT Interval:  356 QTC Calculation: 471 R Axis:   81 Text Interpretation:  Sinus tachycardia Right atrial enlargement inferior and lateral q waves are new compared to 2010 Confirmed by Varney Biles [phone]) on 09/08/2018 2:09:19 PM   EKG Interpretation  Date/Time:  Wednesday September 08 2018 14:11:34 EST Ventricular Rate:  94 PR Interval:    QRS Duration: 100 QT Interval:  371 QTC Calculation: 464 R Axis:   76 Text Interpretation:  Sinus rhythm Right atrial enlargement ST elev, probable normal early repol pattern No significant change since last tracing Confirmed by Varney Biles [id number]) on 09/08/2018 5:29:40 PM         Radiology Dg Chest 2 View  Result Date: 09/08/2018 CLINICAL DATA:  Chest pain with LEFT arm numbness.  COPD. EXAM: CHEST - 2 VIEW COMPARISON:  08/16/2017. FINDINGS: COPD with hyperinflation. Normal cardiomediastinal silhouette. No consolidation or edema. No effusion or pneumothorax. Bones unremarkable. IMPRESSION: COPD.  No active disease.  Similar appearance to priors. Electronically Signed   By: Staci Righter M.D.   On: 09/08/2018 14:48    Procedures .Critical Care Performed by: Varney Biles, MD Authorized by: Varney Biles, MD   Critical care provider statement:    Critical care time (minutes):  45   Critical care start time:  09/08/2018 3:24 PM   Critical care end time:  09/08/2018 6:24 PM   Critical care was time spent personally by me  on the following activities:  Discussions with consultants, evaluation of patient's response to treatment, examination of patient, ordering and performing treatments and interventions, ordering and review of laboratory studies, ordering and review of radiographic studies, pulse oximetry, re-evaluation of patient's condition, obtaining history from patient or surrogate and review of old charts   I assumed direction of critical care for this patient from another provider in my specialty: yes     (including critical care time)   Medications Ordered in ED Medications  nitroGLYCERIN (NITROSTAT) SL tablet 0.4 mg (0.4 mg Sublingual Given 09/08/18 1602)  aspirin chewable tablet 324 mg (324 mg Oral Given 09/08/18 1602)  metoCLOPramide (REGLAN) injection 10 mg (10 mg Intravenous Given 09/08/18  1749)  ketorolac (TORADOL) 30 MG/ML injection 15 mg (15 mg Intravenous Given 09/08/18 1749)     Initial Impression / Assessment and Plan / ED Course  I have reviewed the triage vital signs and the nursing notes.  Pertinent labs & imaging results that were available during my care of the patient were reviewed by me and considered in my medical decision making (see chart for details).     47 year old female comes in with chief complaint of chest pain. Patient is having anterior chest pain that is radiating to her left shoulder and she had jaw pain at some point.  She is also having numbness in her left upper extremity.  Patient's HEAR score is 5 -2 for history, one for EKG and one for age and 1 for risk factors.  Initial troponin is negative.  EKG x2 do not show any dynamic changes. Patient given nitroglycerin and her pain improved from 10 to 2 out of 10.  She has persistent chest discomfort, but it is very mild.  I discussed the case with Dr. Claiborne Billings, cardiology.  We discussed the presenting symptoms with the Q wave in the inferior and lateral leads and the persistent pain that has improved after nitro.  Dr.  Claiborne Billings reviewed the EKG and informed me that the Q waves are nondiagnostic at this point.  Given the persistent pain however he recommends the patient be admitted for serial troponins and get cardiac evaluation.  He is comfortable if the hospitalist wants to admit the patient here and consult cardiology tomorrow morning to see if patient needs catheterization.  There is no need for emergent cath at this point.  Results from the ER workup discussed with the patient face to face and all questions answered to the best of my ability.  She would prefer staying at any pain hospital at this time.  We will consult hospitalist for admission.  Final Clinical Impressions(s) / ED Diagnoses   Final diagnoses:  Unstable angina [facility])    ED Discharge Orders    None       Varney Biles, MD 09/08/18 1824

## 2018-09-09 ENCOUNTER — Other Ambulatory Visit (HOSPITAL_COMMUNITY): Payer: Self-pay | Admitting: *Deleted

## 2018-09-09 ENCOUNTER — Observation Stay (HOSPITAL_BASED_OUTPATIENT_CLINIC_OR_DEPARTMENT_OTHER): Payer: Self-pay

## 2018-09-09 ENCOUNTER — Other Ambulatory Visit (HOSPITAL_COMMUNITY): Payer: Self-pay

## 2018-09-09 ENCOUNTER — Encounter (HOSPITAL_COMMUNITY): Payer: Self-pay | Admitting: Student

## 2018-09-09 DIAGNOSIS — R079 Chest pain, unspecified: Secondary | ICD-10-CM

## 2018-09-09 DIAGNOSIS — K219 Gastro-esophageal reflux disease without esophagitis: Secondary | ICD-10-CM | POA: Diagnosis present

## 2018-09-09 DIAGNOSIS — E876 Hypokalemia: Secondary | ICD-10-CM | POA: Diagnosis present

## 2018-09-09 DIAGNOSIS — J449 Chronic obstructive pulmonary disease, unspecified: Secondary | ICD-10-CM

## 2018-09-09 DIAGNOSIS — R072 Precordial pain: Secondary | ICD-10-CM

## 2018-09-09 DIAGNOSIS — Z720 Tobacco use: Secondary | ICD-10-CM

## 2018-09-09 DIAGNOSIS — I200 Unstable angina: Secondary | ICD-10-CM

## 2018-09-09 LAB — NM MYOCAR MULTI W/SPECT W/WALL MOTION / EF
CHL CUP NUCLEAR SDS: 0
CHL CUP NUCLEAR SRS: 0
CSEPPHR: 136 {beats}/min
LVDIAVOL: 56 mL (ref 46–106)
LVSYSVOL: 17 mL
RATE: 0.27
Rest HR: 61 {beats}/min
SSS: 0
TID: 1.09

## 2018-09-09 LAB — LIPID PANEL
CHOL/HDL RATIO: 2.5 ratio
Cholesterol: 181 mg/dL (ref 0–200)
HDL: 72 mg/dL (ref >40)
LDL CALC: 92 mg/dL (ref 0–99)
Triglycerides: 86 mg/dL (ref <150)
VLDL: 17 mg/dL (ref 0–40)

## 2018-09-09 LAB — TROPONIN I

## 2018-09-09 LAB — CBC
HCT: 41.4 % (ref 36.0–46.0)
Hemoglobin: 13.2 g/dL (ref 12.0–15.0)
MCH: 31.5 pg (ref 26.0–34.0)
MCHC: 31.9 g/dL (ref 30.0–36.0)
MCV: 98.8 fL (ref 80.0–100.0)
PLATELETS: 237 10*3/uL (ref 150–400)
RBC: 4.19 MIL/uL (ref 3.87–5.11)
RDW: 13.5 % (ref 11.5–15.5)
WBC: 7.4 10*3/uL (ref 4.0–10.5)
nRBC: 0 % (ref 0.0–0.2)

## 2018-09-09 LAB — HEPARIN LEVEL (UNFRACTIONATED)
Heparin Unfractionated: 0.27 IU/mL — ABNORMAL LOW (ref 0.30–0.70)
Heparin Unfractionated: 0.57 IU/mL (ref 0.30–0.70)

## 2018-09-09 MED ORDER — NITROGLYCERIN 2 % TD OINT
1.0000 [in_us] | TOPICAL_OINTMENT | Freq: Three times a day (TID) | TRANSDERMAL | Status: DC
  Administered 2018-09-09 (×2): 1 [in_us] via TOPICAL
  Filled 2018-09-09 (×2): qty 1

## 2018-09-09 MED ORDER — SODIUM CHLORIDE 0.9% FLUSH: 3.0000 mL | Freq: Two times a day (BID) | INTRAVENOUS | Status: DC

## 2018-09-09 MED ORDER — ALPRAZOLAM 0.5 MG PO TABS: 0.5000 mg | ORAL_TABLET | Freq: Two times a day (BID) | ORAL | Status: DC | PRN

## 2018-09-09 MED ORDER — ONDANSETRON HCL 4 MG/2ML IJ SOLN: 4.0000 mg | Freq: Four times a day (QID) | INTRAMUSCULAR | Status: DC | PRN

## 2018-09-09 MED ORDER — REGADENOSON 0.4 MG/5ML IV SOLN
INTRAVENOUS | Status: AC
  Administered 2018-09-09: 0.4 mg via INTRAVENOUS
  Filled 2018-09-09: qty 5

## 2018-09-09 MED ORDER — SODIUM CHLORIDE 0.9% FLUSH
INTRAVENOUS | Status: AC
  Administered 2018-09-09: 10 mL via INTRAVENOUS
  Filled 2018-09-09: qty 10

## 2018-09-09 MED ORDER — TECHNETIUM TC 99M TETROFOSMIN IV KIT
10.0000 | PACK | Freq: Once | INTRAVENOUS | Status: AC | PRN
  Administered 2018-09-09: 11 via INTRAVENOUS

## 2018-09-09 MED ORDER — TECHNETIUM TC 99M TETROFOSMIN IV KIT
30.0000 | PACK | Freq: Once | INTRAVENOUS | Status: AC | PRN
  Administered 2018-09-09: 30 via INTRAVENOUS

## 2018-09-09 MED ORDER — ASPIRIN 81 MG PO TBEC: 81.0000 mg | DELAYED_RELEASE_TABLET | Freq: Every day | ORAL | Status: DC

## 2018-09-09 MED ORDER — NICOTINE 14 MG/24HR TD PT24
14.0000 mg | MEDICATED_PATCH | Freq: Every day | TRANSDERMAL | Status: DC
  Administered 2018-09-09: 14 mg via TRANSDERMAL
  Filled 2018-09-09: qty 1

## 2018-09-09 MED ORDER — ACETAMINOPHEN 325 MG PO TABS
650.0000 mg | ORAL_TABLET | ORAL | Status: DC | PRN
  Administered 2018-09-09 (×2): 650 mg via ORAL
  Filled 2018-09-09 (×2): qty 2

## 2018-09-09 MED ORDER — FAMOTIDINE 20 MG PO TABS: 20.0000 mg | ORAL_TABLET | Freq: Every day | ORAL | Status: DC

## 2018-09-09 MED ORDER — ASPIRIN EC 81 MG PO TBEC
81.0000 mg | DELAYED_RELEASE_TABLET | Freq: Every day | ORAL | Status: DC
  Administered 2018-09-09: 81 mg via ORAL
  Filled 2018-09-09: qty 1

## 2018-09-09 NOTE — Discharge Summary (Addendum)
Physician Discharge Summary  Vicki Mcmillan XIH:[id number] DOB: Oct 03, 1971 DOA: 09/08/2018  PCP: [name], NP  Admit date: 09/08/2018 Discharge date: 09/09/2018  Admitted From: Home  Disposition: Home   Recommendations for Outpatient Follow-up:  1. Follow up with PCP in 1-2  Weeks for recheck  Discharge Condition: STABLE   CODE STATUS: FULL    Brief Hospitalization Summary: Please see all hospital notes, images, labs for full details of the hospitalization. HPI: Vicki Mcmillan is a 47 y.o. female with medical history significant of anxiety, depression, panic attacks COPD, active smoker who is coming with complaints of left-sided chest pain radiated to her LUE associated with lightheadedness and mild diaphoresis defers happened yesterday late afternoon that started while she was watching TV.  The pain lasted for several minutes and eased off, but then most of the evening she had the pain on and off.  She was able to go to sleep, but woke up with palpitations in the morning.  However, she mentions that she frequently has early morning palpitations are usually resolve after she gets out of bed and starts being mildly active.  She denies nausea, emesis, PND, orthopnea or pitting edema of the lower extremities.  She smokes about half a pack a day for 3 years.  She has a 10-pack-year history of smoking.  She denies alcohol or recreational drug use.  She gets wheezing and dyspnea frequently.  She uses bronchodilators daily.  She denies fever, chills, sore throat, hemoptysis, diarrhea, constipation, melena or hematochezia.  No dysuria, frequency or hematuria.  The patient states that she is heat intolerant at night, but thinks she may be having some hot flashes.  She denies polyuria, polydipsia, polyphagia or blurred vision.  ED Course: Initial vital signs temperature 97.6 F, pulse 104, respirations 18, blood pressure 121/91 mmHg and O2 sat 100% on room air.  The patient received sublingual  nitroglycerin in the emergency department which relieved her chest pain, but gave her a headache.  Toradol 30 mg IVP x1 given and she denies headache at this time.  She was put on heparin infusion after the case was discussed with cardiology given her EKG changes.  Her CBC was unremarkable.  PT/INR within normal limits. PTT was more than 200 seconds, but heparin infusion had already been started. BMP was normal, except for potassium of 3.2 mmol/L.  EKG was sinus tachycardia with right atrial enlargement, inferior and lateral Q waves that were new when compared to last EKG in 2010.  Troponin x2 were normal.  Her chest radiograph showed emphysema.  The patient was evaluated under observation and continuous telemetry monitoring in the hospital.  The patient was seen by the inpatient cardiology service.  The patient ruled out for acute myocardial ischemia by cardiac enzymes.  The patient was seen by cardiology and they proceeded with a [place] study for inpatient ischemic evaluation which came back with no significant findings to suggest ischemia.  Cardiology said that the patient could discharge home and follow-up outpatient with primary care provider.  The patient is being discharged home for further work-up with primary care provider.  She is being discharged on aspirin 81 mg daily and Pepcid 20 mg daily.  Her lipids were noted to be controlled with LDL of 92 and TC 181, HDL 72 and Trig 86.   The patient was strongly advised to discontinue smoking cigarettes and using tobacco products.  Patient verbalized understanding.  The patient was given additional patient education information regarding smoking cessation at  discharge.  Discharge Diagnoses:  Active Problems:   Depression   COPD (chronic obstructive pulmonary disease) ([facility])   Anxiety   Tobacco use   Hypokalemia   GERD (gastroesophageal reflux disease)    Discharge Instructions: Discharge Instructions    Call MD for:  difficulty  breathing, headache or visual disturbances   Complete by:  As directed    Call MD for:  persistant dizziness or light-headedness   Complete by:  As directed    Call MD for:  persistant nausea and vomiting   Complete by:  As directed    Call MD for:  severe uncontrolled pain   Complete by:  As directed    Diet - low sodium heart healthy   Complete by:  As directed    Increase activity slowly   Complete by:  As directed      Allergies as of 09/09/2018      Reactions   Hydrocodone-acetaminophen Nausea And Vomiting   Chantix [varenicline]    hallucination   Oxycodone Nausea And Vomiting, Other (See Comments)   Stomach upset   Wellbutrin [bupropion]    hallucinations      Medication List    TAKE these medications   albuterol 108 (90 Base) MCG/ACT inhaler Commonly known as:  PROVENTIL HFA;VENTOLIN HFA Inhale 1-2 puffs into the lungs every 6 (six) hours as needed for wheezing or shortness of breath.   ALPRAZolam 0.5 MG tablet Commonly known as:  XANAX Take 0.5 mg by mouth daily as needed for anxiety.   aspirin 81 MG EC tablet Take 1 tablet (81 mg total) by mouth daily.   famotidine 20 MG tablet Commonly known as:  PEPCID Take 1 tablet (20 mg total) by mouth daily.   ipratropium-albuterol 0.5-2.5 (3) MG/3ML Soln Commonly known as:  DUONEB Take 3 mLs by nebulization 2 (two) times daily.   sertraline 100 MG tablet Commonly known as:  ZOLOFT Take 200 mg by mouth daily.      Follow-up Information    [name], NP. Schedule an appointment as soon as possible for a visit in 2 week(s).   Specialty:  Family Medicine Why:  Hospital Follow Up  Contact information: [place] [address] [place] [address] [phone]          Allergies  Allergen Reactions  . Hydrocodone-Acetaminophen Nausea And Vomiting  . Chantix [Varenicline]     hallucination  . Oxycodone Nausea And Vomiting and Other (See Comments)    Stomach upset  . Wellbutrin [Bupropion]      hallucinations   Allergies as of 09/09/2018      Reactions   Hydrocodone-acetaminophen Nausea And Vomiting   Chantix [varenicline]    hallucination   Oxycodone Nausea And Vomiting, Other (See Comments)   Stomach upset   Wellbutrin [bupropion]    hallucinations      Medication List    TAKE these medications   albuterol 108 (90 Base) MCG/ACT inhaler Commonly known as:  PROVENTIL HFA;VENTOLIN HFA Inhale 1-2 puffs into the lungs every 6 (six) hours as needed for wheezing or shortness of breath.   ALPRAZolam 0.5 MG tablet Commonly known as:  XANAX Take 0.5 mg by mouth daily as needed for anxiety.   aspirin 81 MG EC tablet Take 1 tablet (81 mg total) by mouth daily.   famotidine 20 MG tablet Commonly known as:  PEPCID Take 1 tablet (20 mg total) by mouth daily.   ipratropium-albuterol 0.5-2.5 (3) MG/3ML Soln Commonly known as:  DUONEB Take 3 mLs by nebulization 2 (two) times daily.   sertraline 100 MG tablet Commonly known as:  ZOLOFT Take 200 mg by mouth daily.       Procedures/Studies: Dg Chest 2 View  Result Date: 09/08/2018 CLINICAL DATA:  Chest pain with LEFT arm numbness.  COPD. EXAM: CHEST - 2 VIEW COMPARISON:  08/16/2017. FINDINGS: COPD with hyperinflation. Normal cardiomediastinal silhouette. No consolidation or edema. No effusion or pneumothorax. Bones unremarkable. IMPRESSION: COPD.  No active disease.  Similar appearance to priors. Electronically Signed   By: Staci Righter M.D.   On: 09/08/2018 14:48   Nm Myocar Multi W/spect W/wall Motion / Ef  Result Date: 09/09/2018  No diagnostic ST segment changes to indicate ischemia.  No significant myocardial perfusion defects to indicate scar or ischemia. Mild breast attenuation noted.  This is a low risk study.  Nuclear stress EF: 70%.       Subjective: Patient no longer having symptoms of chest pain or shortness of breath.  Discharge Exam: Vitals:   09/09/18 0017 09/09/18 0418  BP: 117/76 105/70  Pulse:  79 80  Resp: 16 18  Temp: 97.9 F (36.6 C) 98.4 F (36.9 C)  SpO2: 99% 98%   Vitals:   09/08/18 2245 09/08/18 2355 09/09/18 0017 09/09/18 0418  BP:   117/76 105/70  Pulse: 79  79 80  Resp:   16 18  Temp:   97.9 F (36.6 C) 98.4 F (36.9 C)  TempSrc:   Oral Oral  SpO2: 96% 94% 99% 98%  Weight:   47.9 kg   Height:   5\' 1"  (1.549 m)    General: Pt is alert, awake, not in acute distress Cardiovascular: RRR, S1/S2 +, no rubs, no gallops Respiratory: CTA bilaterally, no wheezing, no rhonchi Abdominal: Soft, NT, ND, bowel sounds + Extremities: no edema, no cyanosis   The results of significant diagnostics from this hospitalization (including imaging, microbiology, ancillary and laboratory) are listed below for reference.     Microbiology: No results found for this or any previous visit (from the past 240 hour(s)).   Labs: BNP (last 3 results) No results for input(s): BNP in the last 8760 hours. Basic Metabolic Panel: Recent Labs  Lab 09/08/18 1411 09/08/18 2159  NA 141  --   K 3.2*  --   CL 106  --   CO2 27  --   GLUCOSE 85  --   BUN 12  --   CREATININE 0.78  --   CALCIUM 9.3  --   MG  --  2.2  PHOS  --  3.2   Liver Function Tests: No results for input(s): AST, ALT, ALKPHOS, BILITOT, PROT, ALBUMIN in the last 168 hours. No results for input(s): LIPASE, AMYLASE in the last 168 hours. No results for input(s): AMMONIA in the last 168 hours. CBC: Recent Labs  Lab 09/08/18 1411 09/09/18 0204  WBC 6.5 7.4  HGB 15.0 13.2  HCT 47.4* 41.4  MCV 96.1 98.8  PLT 268 237   Cardiac Enzymes: Recent Labs  Lab 09/08/18 1411 09/08/18 2159 09/09/18 0204  TROPONINI <0.03 <0.03 <0.03   BNP: Invalid input(s): POCBNP CBG: No results for input(s): GLUCAP in the last 168 hours. D-Dimer No results for input(s): DDIMER in the last 72 hours. Hgb A1c No results for input(s): HGBA1C in the last 72 hours. Lipid Profile Recent Labs    09/09/18 0204  CHOL 181  HDL 72   LDLCALC 92  TRIG 86  CHOLHDL 2.5  Thyroid function studies No results for input(s): TSH, T4TOTAL, T3FREE, THYROIDAB in the last 72 hours.  Invalid input(s): FREET3 Anemia work up No results for input(s): VITAMINB12, FOLATE, FERRITIN, TIBC, IRON, RETICCTPCT in the last 72 hours. Urinalysis No results found for: COLORURINE, APPEARANCEUR, LABSPEC, [place], GLUCOSEU, HGBUR, BILIRUBINUR, KETONESUR, PROTEINUR, UROBILINOGEN, NITRITE, LEUKOCYTESUR Sepsis Labs Invalid input(s): PROCALCITONIN,  WBC,  LACTICIDVEN Microbiology No results found for this or any previous visit (from the past 240 hour(s)).  Time coordinating discharge:   SIGNED:  Irwin Brakeman, MD  [facility] 09/09/2018, 12:01 PM Pager [phone]  If 7PM-7AM, please contact night-coverage [email or web address] Password [email or web address]

## 2018-09-09 NOTE — Discharge Instructions (Signed)
Please follow up with primary care provider in 1-2 weeks for recheck. Seek medical care or return to ER if symptoms come back, worsen or new problems develop. PLEASE STOP SMOKING OR USING TOBACCO PRODUCTS      Nonspecific Chest Pain Chest pain can be caused by many different conditions. There is always a chance that your pain could be related to something serious, such as a heart attack or a blood clot in your lungs. Chest pain can also be caused by conditions that are not life-threatening. If you have chest pain, it is very important to follow up with your health care provider. What are the causes? Causes of this condition include:  Heartburn.  Pneumonia or bronchitis.  Anxiety or stress.  Inflammation around your heart (pericarditis) or lung (pleuritis or pleurisy).  A blood clot in your lung.  A collapsed lung (pneumothorax). This can develop suddenly on its own (spontaneous pneumothorax) or from trauma to the chest.  Shingles infection (varicella-zoster virus).  Heart attack.  Damage to the bones, muscles, and cartilage that make up your chest wall. This can include: ? Bruised bones due to injury. ? Strained muscles or cartilage due to frequent or repeated coughing or overwork. ? Fracture to one or more ribs. ? Sore cartilage due to inflammation (costochondritis).  What increases the risk? Risk factors for this condition may include:  Activities that increase your risk for trauma or injury to your chest.  Respiratory infections or conditions that cause frequent coughing.  Medical conditions or overeating that can cause heartburn.  Heart disease or family history of heart disease.  Conditions or health behaviors that increase your risk of developing a blood clot.  Having had chicken pox (varicella zoster).  What are the signs or symptoms? Chest pain can feel like:  Burning or tingling on the surface of your chest or deep in your chest.  Crushing, pressure,  aching, or squeezing pain.  Dull or sharp pain that is worse when you move, cough, or take a deep breath.  Pain that is also felt in your back, neck, shoulder, or arm, or pain that spreads to any of these areas.  Your chest pain may come and go, or it may stay constant. How is this diagnosed? Lab tests or other studies may be needed to find the cause of your pain. Your health care provider may have you take a test called an ECG (electrocardiogram). An ECG records your heartbeat patterns at the time the test is performed. You may also have other tests, such as:  Transthoracic echocardiogram (TTE). In this test, sound waves are used to create a picture of the heart structures and to look at how blood flows through your heart.  Transesophageal echocardiogram (TEE).This is a more advanced imaging test that takes images from inside your body. It allows your health care provider to see your heart in finer detail.  Cardiac monitoring. This allows your health care provider to monitor your heart rate and rhythm in real time.  Holter monitor. This is a portable device that records your heartbeat and can help to diagnose abnormal heartbeats. It allows your health care provider to track your heart activity for several days, if needed.  Stress tests. These can be done through exercise or by taking medicine that makes your heart beat more quickly.  Blood tests.  Other imaging tests.  How is this treated? Treatment depends on what is causing your chest pain. Treatment may include:  Medicines. These may include: ? Acid  blockers for heartburn. ? Anti-inflammatory medicine. ? Pain medicine for inflammatory conditions. ? Antibiotic medicine, if an infection is present. ? Medicines to dissolve blood clots. ? Medicines to treat coronary artery disease (CAD).  Supportive care for conditions that do not require medicines. This may include: ? Resting. ? Applying heat or cold packs to injured  areas. ? Limiting activities until pain decreases.  Follow these instructions at home: Medicines  If you were prescribed an antibiotic, take it as told by your health care provider. Do not stop taking the antibiotic even if you start to feel better.  Take over-the-counter and prescription medicines only as told by your health care provider. Lifestyle  Do not use any products that contain nicotine or tobacco, such as cigarettes and e-cigarettes. If you need help quitting, ask your health care provider.  Do not drink alcohol.  Make lifestyle changes as directed by your health care provider. These may include: ? Getting regular exercise. Ask your health care provider to suggest some activities that are safe for you. ? Eating a heart-healthy diet. A registered dietitian can help you to learn healthy eating options. ? Maintaining a healthy weight. ? Managing diabetes, if necessary. ? Reducing stress, such as with yoga or relaxation techniques. General instructions  Avoid any activities that bring on chest pain.  If heartburn is the cause for your chest pain, raise (elevate) the head of your bed about 6 inches (15 cm) by putting blocks under the legs. Sleeping with more pillows does not effectively relieve heartburn because it only changes the position of your head.  Keep all follow-up visits as told by your health care provider. This is important. This includes any further testing if your chest pain does not go away. Contact a health care provider if:  Your chest pain does not go away.  You have a rash with blisters on your chest.  You have a fever.  You have chills. Get help right away if:  Your chest pain is worse.  You have a cough that gets worse, or you cough up blood.  You have severe pain in your abdomen.  You have severe weakness.  You faint.  You have sudden, unexplained chest discomfort.  You have sudden, unexplained discomfort in your arms, back, neck, or  jaw.  You have shortness of breath at any time.  You suddenly start to sweat, or your skin gets clammy.  You feel nauseous or you vomit.  You suddenly feel light-headed or dizzy.  Your heart begins to beat quickly, or it feels like it is skipping beats. These symptoms may represent a serious problem that is an emergency. Do not wait to see if the symptoms will go away. Get medical help right away. Call your local emergency services (911 in the [place] Do not drive yourself to the hospital. This information is not intended to replace advice given to you by your health care provider. Make sure you discuss any questions you have with your health care provider. Document Released: 07/30/2005 Document Revised: 07/14/2016 Document Reviewed: 07/14/2016 Elsevier Interactive Patient Education  2017 [employer].   Cardiac Nuclear Scan A cardiac nuclear scan is a test that measures blood flow to the heart when a person is resting and when he or she is exercising. The test looks for problems such as:  Not enough blood reaching a portion of the heart.  The heart muscle not working normally.  You may need this test if:  You have heart disease.  You have had abnormal lab results.  You have had heart surgery or angioplasty.  You have chest pain.  You have shortness of breath.  In this test, a radioactive dye (tracer) is injected into your bloodstream. After the tracer has traveled to your heart, an imaging device is used to measure how much of the tracer is absorbed by or distributed to various areas of your heart. This procedure is usually done at a hospital and takes 2-4 hours. Tell a health care provider about:  Any allergies you have.  All medicines you are taking, including vitamins, herbs, eye drops, creams, and over-the-counter medicines.  Any problems you or family members have had with the use of anesthetic medicines.  Any blood disorders you have.  Any surgeries you have  had.  Any medical conditions you have.  Whether you are pregnant or may be pregnant. What are the risks? Generally, this is a safe procedure. However, problems may occur, including:  Serious chest pain and heart attack. This is only a risk if the stress portion of the test is done.  Rapid heartbeat.  Sensation of warmth in your chest. This usually passes quickly.  What happens before the procedure?  Ask your health care provider about changing or stopping your regular medicines. This is especially important if you are taking diabetes medicines or blood thinners.  Remove your jewelry on the day of the procedure. What happens during the procedure?  An IV tube will be inserted into one of your veins.  Your health care provider will inject a small amount of radioactive tracer through the tube.  You will wait for 20-40 minutes while the tracer travels through your bloodstream.  Your heart activity will be monitored with an electrocardiogram (ECG).  You will lie down on an exam table.  Images of your heart will be taken for about 15-20 minutes.  You may be asked to exercise on a treadmill or stationary bike. While you exercise, your heart's activity will be monitored with an ECG, and your blood pressure will be checked. If you are unable to exercise, you may be given a medicine to increase blood flow to parts of your heart.  When blood flow to your heart has peaked, a tracer will again be injected through the IV tube.  After 20-40 minutes, you will get back on the exam table and have more images taken of your heart.  When the procedure is over, your IV tube will be removed. The procedure may vary among health care providers and hospitals. Depending on the type of tracer used, scans may need to be repeated 3-4 hours later. What happens after the procedure?  Unless your health care provider tells you otherwise, you may return to your normal schedule, including diet, activities, and  medicines.  Unless your health care provider tells you otherwise, you may increase your fluid intake. This will help flush the contrast dye from your body. Drink enough fluid to keep your urine clear or pale yellow.  It is up to you to get your test results. Ask your health care provider, or the department that is doing the test, when your results will be ready. Summary  A cardiac nuclear scan measures the blood flow to the heart when a person is resting and when he or she is exercising.  You may need this test if you are at risk for heart disease.  Tell your health care provider if you are pregnant.  Unless your health care provider tells  you otherwise, increase your fluid intake. This will help flush the contrast dye from your body. Drink enough fluid to keep your urine clear or pale yellow. This information is not intended to replace advice given to you by your health care provider. Make sure you discuss any questions you have with your health care provider. Document Released: 11/14/2004 Document Revised: 10/22/2016 Document Reviewed: 09/28/2013 Elsevier Interactive Patient Education  2017 [place] with Quitting Smoking Quitting smoking is a physical and mental challenge. You will face cravings, withdrawal symptoms, and temptation. Before quitting, work with your health care provider to make a plan that can help you cope. Preparation can help you quit and keep you from giving in. How can I cope with cravings? Cravings usually last for 5-10 minutes. If you get through it, the craving will pass. Consider taking the following actions to help you cope with cravings:  Keep your mouth busy: ? Chew sugar-free gum. ? Suck on hard candies or a straw. ? Brush your teeth.  Keep your hands and body busy: ? Immediately change to a different activity when you feel a craving. ? Squeeze or play with a ball. ? Do an activity or a hobby, like making bead jewelry, practicing  needlepoint, or working with wood. ? Mix up your normal routine. ? Take a short exercise break. Go for a quick walk or run up and down stairs. ? Spend time in public places where smoking is not allowed.  Focus on doing something kind or helpful for someone else.  Call a friend or family member to talk during a craving.  Join a support group.  Call a quit line, such as 1-800-QUIT-NOW.  Talk with your health care provider about medicines that might help you cope with cravings and make quitting easier for you.  How can I deal with withdrawal symptoms? Your body may experience negative effects as it tries to get used to not having nicotine in the system. These effects are called withdrawal symptoms. They may include:  Feeling hungrier than normal.  Trouble concentrating.  Irritability.  Trouble sleeping.  Feeling depressed.  Restlessness and agitation.  Craving a cigarette.  To manage withdrawal symptoms:  Avoid places, people, and activities that trigger your cravings.  Remember why you want to quit.  Get plenty of sleep.  Avoid coffee and other caffeinated drinks. These may worsen some of your symptoms.  How can I handle social situations? Social situations can be difficult when you are quitting smoking, especially in the first few weeks. To manage this, you can:  Avoid parties, bars, and other social situations where people might be smoking.  Avoid alcohol.  Leave right away if you have the urge to smoke.  Explain to your family and friends that you are quitting smoking. Ask for understanding and support.  Plan activities with friends or family where smoking is not an option.  What are some ways I can cope with stress? Wanting to smoke may cause stress, and stress can make you want to smoke. Find ways to manage your stress. Relaxation techniques can help. For example:  Breathe slowly and deeply, in through your nose and out through your mouth.  Listen to  soothing, relaxing music.  Talk with a family member or friend about your stress.  Light a candle.  Soak in a bath or take a shower.  Think about a peaceful place.  What are some ways I can prevent weight gain? Be aware that many  people gain weight after they quit smoking. However, not everyone does. To keep from gaining weight, have a plan in place before you quit and stick to the plan after you quit. Your plan should include:  Having healthy snacks. When you have a craving, it may help to: ? Eat plain popcorn, crunchy carrots, celery, or other cut vegetables. ? Chew sugar-free gum.  Changing how you eat: ? Eat small portion sizes at meals. ? Eat 4-6 small meals throughout the day instead of 1-2 large meals a day. ? Be mindful when you eat. Do not watch television or do other things that might distract you as you eat.  Exercising regularly: ? Make time to exercise each day. If you do not have time for a long workout, do short bouts of exercise for 5-10 minutes several times a day. ? Do some form of strengthening exercise, like weight lifting, and some form of aerobic exercise, like running or swimming.  Drinking plenty of water or other low-calorie or no-calorie drinks. Drink 6-8 glasses of water daily, or as much as instructed by your health care provider.  Summary  Quitting smoking is a physical and mental challenge. You will face cravings, withdrawal symptoms, and temptation to smoke again. Preparation can help you as you go through these challenges.  You can cope with cravings by keeping your mouth busy (such as by chewing gum), keeping your body and hands busy, and making calls to family, friends, or a helpline for people who want to quit smoking.  You can cope with withdrawal symptoms by avoiding places where people smoke, avoiding drinks with caffeine, and getting plenty of rest.  Ask your health care provider about the different ways to prevent weight gain, avoid stress,  and handle social situations. This information is not intended to replace advice given to you by your health care provider. Make sure you discuss any questions you have with your health care provider. Document Released: 10/17/2016 Document Revised: 10/17/2016 Document Reviewed: 10/17/2016 Elsevier Interactive Patient Education  2018 [employer].  Steps to Quit Smoking Smoking tobacco can be harmful to your health and can affect almost every organ in your body. Smoking puts you, and those around you, at risk for developing many serious chronic diseases. Quitting smoking is difficult, but it is one of the best things that you can do for your health. It is never too late to quit. What are the benefits of quitting smoking? When you quit smoking, you lower your risk of developing serious diseases and conditions, such as:  Lung cancer or lung disease, such as COPD.  Heart disease.  Stroke.  Heart attack.  Infertility.  Osteoporosis and bone fractures.  Additionally, symptoms such as coughing, wheezing, and shortness of breath may get better when you quit. You may also find that you get sick less often because your body is stronger at fighting off colds and infections. If you are pregnant, quitting smoking can help to reduce your chances of having a baby of low birth weight. How do I get ready to quit? When you decide to quit smoking, create a plan to make sure that you are successful. Before you quit:  Pick a date to quit. Set a date within the next two weeks to give you time to prepare.  Write down the reasons why you are quitting. Keep this list in places where you will see it often, such as on your bathroom mirror or in your car or wallet.  Identify the people,  places, things, and activities that make you want to smoke (triggers) and avoid them. Make sure to take these actions: ? Throw away all cigarettes at home, at work, and in your car. ? Throw away smoking accessories, such as  Scientist, research (medical). ? Clean your car and make sure to empty the ashtray. ? Clean your home, including curtains and carpets.  Tell your family, friends, and coworkers that you are quitting. Support from your loved ones can make quitting easier.  Talk with your health care provider about your options for quitting smoking.  Find out what treatment options are covered by your health insurance.  What strategies can I use to quit smoking? Talk with your healthcare provider about different strategies to quit smoking. Some strategies include:  Quitting smoking altogether instead of gradually lessening how much you smoke over a period of time. Research shows that quitting cold [place] is more successful than gradually quitting.  Attending in-person counseling to help you build problem-solving skills. You are more likely to have success in quitting if you attend several counseling sessions. Even short sessions of 10 minutes can be effective.  Finding resources and support systems that can help you to quit smoking and remain smoke-free after you quit. These resources are most helpful when you use them often. They can include: ? Online chats with a Social worker. ? Telephone quitlines. ? Careers information officer. ? Support groups or group counseling. ? Text messaging programs. ? Mobile phone applications.  Taking medicines to help you quit smoking. (If you are pregnant or breastfeeding, talk with your health care provider first.) Some medicines contain nicotine and some do not. Both types of medicines help with cravings, but the medicines that include nicotine help to relieve withdrawal symptoms. Your health care provider may recommend: ? Nicotine patches, gum, or lozenges. ? Nicotine inhalers or sprays. ? Non-nicotine medicine that is taken by mouth.  Talk with your health care provider about combining strategies, such as taking medicines while you are also receiving in-person counseling. Using  these two strategies together makes you more likely to succeed in quitting than if you used either strategy on its own. If you are pregnant or breastfeeding, talk with your health care provider about finding counseling or other support strategies to quit smoking. Do not take medicine to help you quit smoking unless told to do so by your health care provider. What things can I do to make it easier to quit? Quitting smoking might feel overwhelming at first, but there is a lot that you can do to make it easier. Take these important actions:  Reach out to your family and friends and ask that they support and encourage you during this time. Call telephone quitlines, reach out to support groups, or work with a counselor for support.  Ask people who smoke to avoid smoking around you.  Avoid places that trigger you to smoke, such as bars, parties, or smoke-break areas at work.  Spend time around people who do not smoke.  Lessen stress in your life, because stress can be a smoking trigger for some people. To lessen stress, try: ? Exercising regularly. ? Deep-breathing exercises. ? Yoga. ? Meditating. ? Performing a body scan. This involves closing your eyes, scanning your body from head to toe, and noticing which parts of your body are particularly tense. Purposefully relax the muscles in those areas.  Download or purchase mobile phone or tablet apps (applications) that can help you stick to your quit plan by providing  reminders, tips, and encouragement. There are many free apps, such as QuitGuide from the [employer] Office manager for Disease Control and Prevention). You can find other support for quitting smoking (smoking cessation) through smokefree.gov and other websites.  How will I feel when I quit smoking? Within the first 24 hours of quitting smoking, you may start to feel some withdrawal symptoms. These symptoms are usually most noticeable 2-3 days after quitting, but they usually do not last beyond 2-3  weeks. Changes or symptoms that you might experience include:  Mood swings.  Restlessness, anxiety, or irritation.  Difficulty concentrating.  Dizziness.  Strong cravings for sugary foods in addition to nicotine.  Mild weight gain.  Constipation.  Nausea.  Coughing or a sore throat.  Changes in how your medicines work in your body.  A depressed mood.  Difficulty sleeping (insomnia).  After the first 2-3 weeks of quitting, you may start to notice more positive results, such as:  Improved sense of smell and taste.  Decreased coughing and sore throat.  Slower heart rate.  Lower blood pressure.  Clearer skin.  The ability to breathe more easily.  Fewer sick days.  Quitting smoking is very challenging for most people. Do not get discouraged if you are not successful the first time. Some people need to make many attempts to quit before they achieve long-term success. Do your best to stick to your quit plan, and talk with your health care provider if you have any questions or concerns. This information is not intended to replace advice given to you by your health care provider. Make sure you discuss any questions you have with your health care provider. Document Released: 10/14/2001 Document Revised: 06/17/2016 Document Reviewed: 03/06/2015 Elsevier Interactive Patient Education  [employer].

## 2018-09-09 NOTE — Progress Notes (Signed)
[facility] for Heparin Indication: chest pain/ACS  Allergies  Allergen Reactions  . Hydrocodone-Acetaminophen Nausea And Vomiting  . Chantix [Varenicline]     hallucination  . Oxycodone Nausea And Vomiting and Other (See Comments)    Stomach upset  . Wellbutrin [Bupropion]     hallucinations    Patient Measurements: Height: 5\' 1"  (154.9 cm) Weight: 105 lb 9.6 oz (47.9 kg) IBW/kg (Calculated) : 47.8 HEPARIN DW (KG): 47.9   Vital Signs: Temp: 98.4 F (36.9 C) (11/07 0418) Temp Source: Oral (11/07 0418) BP: 105/70 (11/07 0418) Pulse Rate: 80 (11/07 0418)  Labs: Recent Labs    09/08/18 1411 09/08/18 1956 09/08/18 2159 09/09/18 0204 09/09/18 1109  HGB 15.0  --   --  13.2  --   HCT 47.4*  --   --  41.4  --   PLT 268  --   --  237  --   APTT  --  >200*  --   --   --   LABPROT  --  13.8  --   --   --   INR  --  1.07  --   --   --   HEPARINUNFRC  --   --   --  0.27* 0.57  CREATININE 0.78  --   --   --   --   TROPONINI <0.03  --  <0.03 <0.03  --     Estimated Creatinine Clearance: 64.9 mL/min (by C-G formula based on SCr of 0.78 mg/dL).   Medical History: Past Medical History:  Diagnosis Date  . Anxiety   . COPD (chronic obstructive pulmonary disease) ([place])   . Depression   . Panic attacks     Medications:  Medications Prior to Admission  Medication Sig Dispense Refill Last Dose  . albuterol (PROVENTIL HFA;VENTOLIN HFA) 108 (90 Base) MCG/ACT inhaler Inhale 1-2 puffs into the lungs every 6 (six) hours as needed for wheezing or shortness of breath.   Past Week at Unknown time  . ALPRAZolam (XANAX) 0.5 MG tablet Take 0.5 mg by mouth daily as needed for anxiety.   09/08/2018 at Unknown time  . ipratropium-albuterol (DUONEB) 0.5-2.5 (3) MG/3ML SOLN Take 3 mLs by nebulization 2 (two) times daily.   09/08/2018 at Unknown time  . sertraline (ZOLOFT) 100 MG tablet Take 200 mg by mouth daily.    09/06/2018 at Unknown time      Assessment: 47 y/o F on heparin for CP, getting cardiology consult today. Heparin level therapeutic at 0.57  Goal of Therapy:  Heparin level 0.3-0.7 units/ml Monitor platelets by anticoagulation protocol: Yes   Plan:  Continue heparin at 750 units/hr Check heparin level daily  Margot Ables, PharmD Clinical Pharmacist 09/09/2018 11:52 AM

## 2018-09-09 NOTE — Progress Notes (Signed)
[facility] for Heparin Indication: chest pain/ACS  Allergies  Allergen Reactions  . Hydrocodone-Acetaminophen Nausea And Vomiting  . Chantix [Varenicline]     hallucination  . Oxycodone Nausea And Vomiting and Other (See Comments)    Stomach upset  . Wellbutrin [Bupropion]     hallucinations    Patient Measurements: Height: 5\' 1"  (154.9 cm) Weight: 105 lb 9.6 oz (47.9 kg) IBW/kg (Calculated) : 47.8 HEPARIN DW (KG): 47.9   Vital Signs: Temp: 97.9 F (36.6 C) (11/07 0017) Temp Source: Oral (11/07 0017) BP: 117/76 (11/07 0017) Pulse Rate: 79 (11/07 0017)  Labs: Recent Labs    09/08/18 1411 09/08/18 1956 09/08/18 2159 09/09/18 0204  HGB 15.0  --   --  13.2  HCT 47.4*  --   --  41.4  PLT 268  --   --  237  APTT  --  >200*  --   --   LABPROT  --  13.8  --   --   INR  --  1.07  --   --   HEPARINUNFRC  --   --   --  0.27*  CREATININE 0.78  --   --   --   TROPONINI <0.03  --  <0.03 <0.03    Estimated Creatinine Clearance: 64.9 mL/min (by C-G formula based on SCr of 0.78 mg/dL).   Medical History: Past Medical History:  Diagnosis Date  . Anxiety   . COPD (chronic obstructive pulmonary disease) ([place])   . Depression   . Panic attacks   . Panic attacks     Medications:  Medications Prior to Admission  Medication Sig Dispense Refill Last Dose  . albuterol (PROVENTIL HFA;VENTOLIN HFA) 108 (90 Base) MCG/ACT inhaler Inhale 1-2 puffs into the lungs every 6 (six) hours as needed for wheezing or shortness of breath.   Past Week at Unknown time  . ALPRAZolam (XANAX) 0.5 MG tablet Take 0.5 mg by mouth daily as needed for anxiety.   09/08/2018 at Unknown time  . ipratropium-albuterol (DUONEB) 0.5-2.5 (3) MG/3ML SOLN Take 3 mLs by nebulization 2 (two) times daily.   09/08/2018 at Unknown time  . sertraline (ZOLOFT) 100 MG tablet Take 200 mg by mouth daily.    09/06/2018 at Unknown time     Assessment: 47 y/o F on heparin for CP, getting  cardiology consult today, initial heparin level this AM is just below goal, no issues per RN, CBC good.   Goal of Therapy:  Heparin level 0.3-0.7 units/ml Monitor platelets by anticoagulation protocol: Yes   Plan:  Increase heparin to 750 units/hr Re-check heparin level in 6-8 hours  Narda Bonds, PharmD, [facility] Pharmacist Phone: [phone]

## 2018-09-09 NOTE — Consult Note (Addendum)
Cardiology Consult    Patient ID: Vicki Mcmillan; [id number]; 1971/09/06   Admit date: 09/08/2018 Date of Consult: 09/09/2018  Primary Care Provider: [name], NP Primary Cardiologist: New to [facility] - Dr. Domenic Polite  Patient Profile    Vicki Mcmillan is a 47 y.o. female with past medical history of anxiety, COPD, tobacco use, and no prior cardiac history who is being seen today for the evaluation of chest pain at the request of Dr. Olevia Bowens.   History of Present Illness    Ms. Maslin reports having baseline dyspnea on exertion in the setting of COPD and denies any recent changes in her respiratory status. Starting 2 days ago, she developed a tightness along her precordial region which would radiate into her left arm. Symptoms would occur at rest or with activity and last for 30 minutes up to an hour. Pain was not exacerbated with exertion, positional changes, or coughing. She denies any associated nausea, vomiting, or diaphoresis. No recent orthopnea, PND, or lower extremity edema. She does note occasional palpitations which occurs in the morning hours upon waking to use the restroom. Denies any associated lightheadedness, dizziness, or presyncope.  She was given sublingual nitroglycerin while in the ED and says this did not help her symptoms but she developed an "electric shock" sensation along her left arm with the medication. She has a NTG patch on currently and is tolerating this well but says the aching sensation along her chest has been present for a few hours at this time, not really relieved with NTG.   No known history of CAD, HTN, HLD, or Type 2 DM. Had an echo performed at [facility] last year which she says was "normal". She does use tobacco products and has a 25+ pack year history. Currently smoking 8-10 cigarettes per day. Denies any alcohol use or recreational drug use. Reports her maternal aunts and uncles had CAD. No history of known CAD in her parents or siblings. Her  dad died from complications related to COPD.   Initial labs show WBC 6.5, Hgb 15.0, platelets 268, Na+ 141, K+ 3.2, and creatinine 0.78.  PTT greater than 200.  Magnesium 2.2.  Initial and cyclic troponin values have been negative.  CXR showing COPD with hyperinflation but no acute cardiopulmonary abnormalities.  EKG shows sinus tachycardia, heart rate 105, with Q waves along the inferior and lateral leads and J-point elevation.  No prior tracings available in EPIC for comparison.   Past Medical History:  Diagnosis Date  . Anxiety   . COPD (chronic obstructive pulmonary disease) ([place])   . Depression   . Panic attacks     Past Surgical History:  Procedure Laterality Date  . WRIST SURGERY       Home Medications:  Prior to Admission medications   Medication Sig Start Date End Date Taking? Authorizing Provider  albuterol (PROVENTIL HFA;VENTOLIN HFA) 108 (90 Base) MCG/ACT inhaler Inhale 1-2 puffs into the lungs every 6 (six) hours as needed for wheezing or shortness of breath.   Yes [provider]  ALPRAZolam Duanne Moron) 0.5 MG tablet Take 0.5 mg by mouth daily as needed for anxiety.   Yes [provider]  ipratropium-albuterol (DUONEB) 0.5-2.5 (3) MG/3ML SOLN Take 3 mLs by nebulization 2 (two) times daily.   Yes [provider]  sertraline (ZOLOFT) 100 MG tablet Take 200 mg by mouth daily.    Yes [provider]    Inpatient Medications: Scheduled Meds: . aspirin EC  81 mg Oral  Daily  . famotidine  20 mg Oral BID  . Influenza vac split quadrivalent PF  0.5 mL Intramuscular Tomorrow-1000  . nicotine  14 mg Transdermal Daily  . nitroGLYCERIN  1 inch Topical Q8H  . pneumococcal 23 valent vaccine  0.5 mL Intramuscular Tomorrow-1000  . sertraline  200 mg Oral Daily  . sodium chloride flush  3 mL Intravenous Q12H   Continuous Infusions: . heparin 750 Units/hr (09/09/18 0307)   PRN Meds: acetaminophen, ALPRAZolam, ipratropium-albuterol, nitroGLYCERIN,  ondansetron (ZOFRAN) IV  Allergies:    Allergies  Allergen Reactions  . Hydrocodone-Acetaminophen Nausea And Vomiting  . Chantix [Varenicline]     hallucination  . Oxycodone Nausea And Vomiting and Other (See Comments)    Stomach upset  . Wellbutrin [Bupropion]     hallucinations    Social History:   Social History   Socioeconomic History  . Marital status: Legally Separated    Spouse name: Not on file  . Number of children: Not on file  . Years of education: Not on file  . Highest education level: Not on file  Occupational History  . Not on file  Social Needs  . Financial resource strain: Not on file  . Food insecurity:    Worry: Not on file    Inability: Not on file  . Transportation needs:    Medical: Not on file    Non-medical: Not on file  Tobacco Use  . Smoking status: Current Every Day Smoker    Packs/day: 0.50    Types: Cigarettes  . Smokeless tobacco: Never Used  Substance and Sexual Activity  . Alcohol use: No  . Drug use: No  . Sexual activity: Not on file  Lifestyle  . Physical activity:    Days per week: Not on file    Minutes per session: Not on file  . Stress: Not on file  Relationships  . Social connections:    Talks on phone: Not on file    Gets together: Not on file    Attends religious service: Not on file    Active member of club or organization: Not on file    Attends meetings of clubs or organizations: Not on file    Relationship status: Not on file  . Intimate partner violence:    Fear of current or ex partner: Not on file    Emotionally abused: Not on file    Physically abused: Not on file    Forced sexual activity: Not on file  Other Topics Concern  . Not on file  Social History Narrative  . Not on file     Family History:    Family History  Problem Relation Age of Onset  . COPD Sister        Died in her 60s from it.  Marland Kitchen COPD Maternal Aunt   . COPD Father       Review of Systems    General:  No chills, fever, night  sweats or weight changes.  Cardiovascular:  No edema, orthopnea, palpitations, paroxysmal nocturnal dyspnea. Positive for chest pain and dyspnea on exertion.  Dermatological: No rash, lesions/masses Respiratory: No cough, dyspnea Urologic: No hematuria, dysuria Abdominal:   No nausea, vomiting, diarrhea, bright red blood per rectum, melena, or hematemesis Neurologic:  No visual changes, wkns, changes in mental status. All other systems reviewed and are otherwise negative except as noted above.  Physical Exam/Data    Vitals:   09/08/18 2245 09/08/18 2355 09/09/18 0017 09/09/18 0418  BP:  117/76 105/70  Pulse: 79  79 80  Resp:   16 18  Temp:   97.9 F (36.6 C) 98.4 F (36.9 C)  TempSrc:   Oral Oral  SpO2: 96% 94% 99% 98%  Weight:   47.9 kg   Height:   5\' 1"  (1.549 m)     Intake/Output Summary (Last 24 hours) at 09/09/2018 0840 Last data filed at 09/09/2018 0307 Gross per 24 hour  Intake 55 ml  Output -  Net 55 ml   Filed Weights   09/08/18 1359 09/09/18 0017  Weight: 47.2 kg 47.9 kg   Body mass index is 19.95 kg/m.   General: Pleasant, Caucasian female appearing in NAD Psych: Normal affect. Neuro: Alert and oriented X 3. Moves all extremities spontaneously. HEENT: Normal  Neck: Supple without bruits or JVD. Lungs:  Resp regular and unlabored, CTA without wheezing or rales. Heart: RRR no s3, s4, or murmurs. Abdomen: Soft, non-tender, non-distended, BS + x 4.  Extremities: No clubbing, cyanosis or lower extremity edema. DP/PT/Radials 2+ and equal bilaterally.    Labs/Studies     Relevant CV Studies:  Echocardiogram: Pending  Laboratory Data:  Chemistry Recent Labs  Lab 09/08/18 1411  NA 141  K 3.2*  CL 106  CO2 27  GLUCOSE 85  BUN 12  CREATININE 0.78  CALCIUM 9.3  GFRNONAA >60  GFRAA >60  ANIONGAP 8    No results for input(s): PROT, ALBUMIN, AST, ALT, ALKPHOS, BILITOT in the last 168 hours. Hematology Recent Labs  Lab 09/08/18 1411  09/09/18 0204  WBC 6.5 7.4  RBC 4.93 4.19  HGB 15.0 13.2  HCT 47.4* 41.4  MCV 96.1 98.8  MCH 30.4 31.5  MCHC 31.6 31.9  RDW 13.6 13.5  PLT 268 237   Cardiac Enzymes Recent Labs  Lab 09/08/18 1411 09/08/18 2159 09/09/18 0204  TROPONINI <0.03 <0.03 <0.03   No results for input(s): TROPIPOC in the last 168 hours.  BNPNo results for input(s): BNP, PROBNP in the last 168 hours.  DDimer No results for input(s): DDIMER in the last 168 hours.  Radiology/Studies:  Dg Chest 2 View  Result Date: 09/08/2018 CLINICAL DATA:  Chest pain with LEFT arm numbness.  COPD. EXAM: CHEST - 2 VIEW COMPARISON:  08/16/2017. FINDINGS: COPD with hyperinflation. Normal cardiomediastinal silhouette. No consolidation or edema. No effusion or pneumothorax. Bones unremarkable. IMPRESSION: COPD.  No active disease.  Similar appearance to priors. Electronically Signed   By: Staci Righter M.D.   On: 09/08/2018 14:48     Assessment & Plan    1. Chest Pain with Mixed Typical and Atypical Features - presented with new-onset chest pain which has occurred at rest or with activity and lasted for 30-60 minute intervals and would represent throughout the day. Not associated with positional changes or coughing but did radiate into her left arm. Has baseline dyspnea on exertion secondary to COPD and denies any acute changes in her respiratory status.  - Initial and cyclic troponin values have been negative. EKG shows sinus tachycardia, heart rate 105, with Q waves along the inferior and lateral leads and J-point elevation. No prior tracings available in EPIC for comparison. - her main cardiac risk factors are continued tobacco use and family history of CAD. Given her presenting symptoms and abnormal EKG, will plan to obtain a [place] for ischemic evaluation. Unable to walk on the treadmill given her COPD and with no active wheezing or examination, Lexiscan is acceptable to use. Echocardiogram has also been  ordered to  assess structural function. Started on Heparin at the time of admission and would continue for now until results of stress test are available. Start ASA 81mg  daily. No BB given baseline COPD. Will check FLP for risk stratification.   2. COPD - Has been continued on PRN nebulizer therapy. Oxygen saturations currently appropriate on room air and no active wheezing appreciated on examination.   3. Hypokalemia - K+ 3.2 on admission. Replaced while in the ED.  4. Tobacco Use - she has a 25+ pack year history and continues to smoke 8-10 cigarettes per day. Cessation advised.   For questions or updates, please contact [facility] Please consult [email or web address] for contact info under Cardiology/STEMI.  Signed, Erma Heritage, PA-C 09/09/2018, 8:40 AM Pager: [phone]   Attending note:  Patient seen and examined.  Records reviewed and case discussed with Ms. Ahmed Prima PA-C.  47 year old woman with a history of COPD and tobacco abuse as well as anxiety, now presenting with recurring episodes of chest discomfort.  Episodes occur both at rest and with activity, no definitive precipitant.  She denies any cough or wheezing.  Under observation troponin I levels have been negative.  ECG shows sinus rhythm with probable early repolarization changes.  On examination this morning she appears comfortable.  No active chest pain.  She is afebrile.  Blood pressure 10 5-1 20 range with heart rate in the 60s to 80s in sinus rhythm by telemetry which I personally reviewed.  Lungs exhibit decreased breath sounds but no wheezing.  Cardiac exam reveals RRR without gallop.  Lab work shows creatinine 0.78, troponin I less than 0.033, LDL 92, hemoglobin 13.2, platelets 237.  Chest x-ray consistent with COPD but no acute process.  She presents with chest discomfort, no evidence of ACS by cardiac enzymes, and nonspecific ECG with probable early repolarization.  Plan is to proceed with a Lexiscan Myoview as an  inpatient for ischemic evaluation.  We have discontinued nitroglycerin paste for this test.  Further recommendations to follow.  Satira Sark, M.D., F.A.C.C.

## 2018-09-10 LAB — ALPHA-1-ANTITRYPSIN: A-1 Antitrypsin, Ser: 127 mg/dL (ref 101–187)

## 2018-12-21 ENCOUNTER — Ambulatory Visit (HOSPITAL_COMMUNITY): Payer: Self-pay | Admitting: Psychiatry

## 2018-12-21 ENCOUNTER — Encounter (HOSPITAL_COMMUNITY): Payer: Self-pay | Admitting: Psychiatry

## 2018-12-21 VITALS — BP 101/64 | Ht 61.0 in | Wt 101.0 lb

## 2018-12-21 DIAGNOSIS — F331 Major depressive disorder, recurrent, moderate: Secondary | ICD-10-CM

## 2018-12-21 DIAGNOSIS — F410 Panic disorder [episodic paroxysmal anxiety] without agoraphobia: Secondary | ICD-10-CM

## 2018-12-21 DIAGNOSIS — F411 Generalized anxiety disorder: Secondary | ICD-10-CM

## 2018-12-21 DIAGNOSIS — F17200 Nicotine dependence, unspecified, uncomplicated: Secondary | ICD-10-CM | POA: Insufficient documentation

## 2018-12-21 MED ORDER — SERTRALINE HCL 100 MG PO TABS: 150.0000 mg | ORAL_TABLET | Freq: Every day | ORAL | 1 refills | Status: DC

## 2018-12-21 MED ORDER — CLONAZEPAM 0.5 MG PO TABS: ORAL_TABLET | ORAL | 1 refills | Status: DC

## 2018-12-21 NOTE — Progress Notes (Signed)
Psychiatric Initial Adult Assessment   Patient Identification: Vicki Mcmillan MRN:  [id number] Date of Evaluation:  12/21/2018 Referral Source: Bradly Bienenstock, primary care physician.   Chief Complaint:  I need my medication.  I have anxiety and panic attacks.  Visit Diagnosis:    ICD-10-CM   1. Panic attack F41.0 clonazePAM (KLONOPIN) 0.5 MG tablet  2. MDD (major depressive disorder), recurrent episode, moderate (HCC) F33.1 sertraline (ZOLOFT) 100 MG tablet  3. GAD (generalized anxiety disorder) F41.1 sertraline (ZOLOFT) 100 MG tablet    History of Present Illness: Vicki Mcmillan is 48 year old Caucasian female currently unemployed and separated from her husband came to her initial appointment with her mother for the medication management.  Patient reported that she has depression anxiety and panic attack since 2017 after she tried Chantix to stop smoking.  Chantix was started by her primary care physician and soon after that she developed hallucination, paranoia, severe anxiety, depression.  Her PCP stopped the Chantix and tried patch but her symptoms continues to progress.  She then tried Wellbutrin but she continued to have hallucination and her primary care physician finally started her on Xanax and referred her to see psychiatry.  She also saw neurology because of cognitive impairment.  She was experiencing poor attention, poor focus, confusion, forgetfulness and she felt it was related to using Chantix.  Patient recall she had testing done by [facility] neurologist and she referred to see neurology.  She saw Dr. Earley Brooke and she was told the testing was inconclusive.  Patient did well on Zoloft, Abilify and she continued Xanax from primary care physician.  However recently she lost her insurance.  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  She is also separated 2 months ago due  to having marital conflicts with her husband.  She is now living with her mother.  She has no insurance and she apply for disability.  She had a court hearing last month and judge mandate her to see a psychiatrist for disability evaluation.  She has upcoming appointment with a psychiatrist in [facility].  Patient also diagnosed with COPD however has not recall any PFT or PET scan as she cannot afford.  She admitted having chest pain which she believe due to panic attacks and may be due to COPD.  She endorsed feeling sad, depressed, isolated, withdrawn and have no energy.  She is sleeping okay but admitted having racing thoughts continued memory issues, severe anxiety, fear, nervousness and poor concentration.  Her energy level is low.  She also endorsed sometimes having visual and auditory hallucination which she described seeing shadows and hearing music.  She denies any aggression, violence, highs and lows, obsessive thoughts or any nightmares.  She stopped taking Abilify because she could not afford.  She recall Abilify helped but at this time she is unable to afford all her medication.  Even though she was prescribed Zoloft 200 mg she is only taking 100 mg because it is too expensive.  She is taking Xanax 0.5 mg half tablet as needed.  Patient denies drinking or using any illegal substances.  She is not happy with her previous psychiatrist Dr. Jessy Oto who dropped her as patient has no insurance.  She is hoping her disability went through so she can focus on the treatment.  Patient has GERD, tobacco use, COPD.  Her appetite is okay.  She has a 32 year old son from her first marriage who lives with the father.  Patient had a good support from her mother.    Associated Signs/Symptoms: Depression Symptoms:  depressed mood, anhedonia, insomnia, feelings of worthlessness/guilt, difficulty concentrating, impaired memory, anxiety, panic attacks, loss of energy/fatigue, disturbed sleep, (Hypo) Manic Symptoms:   Irritable Mood, Anxiety Symptoms:  Excessive Worry, Panic Symptoms, Psychotic Symptoms:  Hallucinations: Visual and auditory hallucination.  Complaining of hearing music and seeing shadows. PTSD Symptoms: Negative  Past Psychiatric History: No history of suicidal attempt or any psychiatric inpatient treatment.  Started seeing psychiatrist after Chantix caused increased anxiety, hallucination and memory impairment.  Saw Dr. Jessy Oto at [facility] and prescribed Zoloft Abilify with good response.  PCP prescribed Xanax.  Previous Psychotropic Medications: Yes   Substance Abuse History in the last 12 months:  No.  Consequences of Substance Abuse: Negative  Past Medical History:  Past Medical History:  Diagnosis Date  . Anxiety   . COPD (chronic obstructive pulmonary disease) ([place])   . Depression   . Panic attacks     Past Surgical History:  Procedure Laterality Date  . TUBAL LIGATION    . WRIST SURGERY      Family Psychiatric History: Denies any family psychiatric history.    Family History:  Family History  Problem Relation Age of Onset  . COPD Sister        Died in her 33s from it.  Marland Kitchen COPD Maternal Aunt   . COPD Father     Social History:   Social History   Socioeconomic History  . Marital status: Legally Separated    Spouse name: Not on file  . Number of children: 1  . Years of education: Not on file  . Highest education level: Not on file  Occupational History  . Not on file  Social Needs  . Financial resource strain: Very hard  . Food insecurity:    Worry: Sometimes true    Inability: Sometimes true  . Transportation needs:    Medical: Yes    Non-medical: Yes  Tobacco Use  . Smoking status: Current Every Day Smoker    Packs/day: 0.50    Types: Cigarettes  . Smokeless tobacco: Never Used  Substance and Sexual Activity  . Alcohol use: No  . Drug use: No  . Sexual activity: Yes    Birth control/protection: None  Lifestyle  . Physical activity:    Days  per week: 0 days    Minutes per session: 0 min  . Stress: Very much  Relationships  . Social connections:    Talks on phone: Never    Gets together: More than three times a week    Attends religious service: Never    Active member of club or organization: No    Attends meetings of clubs or organizations: Never    Relationship status: Separated  Other Topics Concern  . Not on file  Social History Narrative  . Not on file    Additional Social History:  Patient born and raised in [place].  She married twice.  Her first marriage ended after 20 years.  She had a 56 year old son who lives with his father.  Patient remarried but currently separated from her husband.  Patient admitted having marital conflicts but hoping to reconcile soon.  Currently she lives with her mother.  Patient was working with [employer] [place] [employer] for 18 years until could not work due to her underlying illness.  Allergies:  Allergies  Allergen Reactions  . Hydrocodone-Acetaminophen Nausea And Vomiting  . Chantix [Varenicline]     hallucination  . Oxycodone Nausea And Vomiting and Other (See Comments)    Stomach upset  . Wellbutrin [Bupropion]     hallucinations    Metabolic Disorder Labs: No results found for: HGBA1C, MPG No results found for: PROLACTIN Lab Results  Component Value Date   CHOL 181 09/09/2018   TRIG 86 09/09/2018   HDL 72 09/09/2018   CHOLHDL 2.5 09/09/2018   VLDL 17 09/09/2018   LDLCALC 92 09/09/2018   No results found for: TSH  Therapeutic Level Labs: No results found for: LITHIUM No results found for: CBMZ No results found for: VALPROATE  Current Medications: Current Outpatient Medications  Medication Sig Dispense Refill  . albuterol (PROVENTIL HFA;VENTOLIN HFA) 108 (90 Base) MCG/ACT inhaler Inhale 1-2 puffs into the lungs every 6 (six) hours as needed for wheezing or shortness of breath.    Marland Kitchen aspirin EC 81 MG EC tablet Take 1 tablet (81 mg total) by mouth  daily.    . baclofen (LIORESAL) 10 MG tablet Take 10 mg by mouth daily.    Marland Kitchen ipratropium-albuterol (DUONEB) 0.5-2.5 (3) MG/3ML SOLN Take 3 mLs by nebulization 2 (two) times daily.    . sertraline (ZOLOFT) 100 MG tablet Take 1.5 tablets (150 mg total) by mouth daily. 45 tablet 1  . clonazePAM (KLONOPIN) 0.5 MG tablet Take 1/2 to one tab as needed for panic attack 30 tablet 1   No current facility-administered medications for this visit.     Musculoskeletal: Strength & Muscle Tone: within normal limits Gait & Station: normal Patient leans: N/A  Psychiatric Specialty Exam: ROS  Blood pressure 101/64, height 5\' 1"  (1.549 m), weight 101 lb (45.8 kg).Body mass index is 19.08 kg/m.  General Appearance: Casual  Eye Contact:  Fair  Speech:  Clear and Coherent  Volume:  Normal  Mood:  Anxious and Dysphoric  Affect:  Constricted and Depressed  Thought Process:  Goal Directed  Orientation:  Full (Time, Place, and Person)  Thought Content:  Hallucinations: Auditory Visual and Rumination  Suicidal Thoughts:  No  Homicidal Thoughts:  No  Memory:  Immediate;   Good Recent;   Fair Remote;   Fair  Judgement:  Good  Insight:  Fair  Psychomotor Activity:  Decreased  Concentration:  Concentration: Fair and Attention Span: Fair  Recall:  [employer] of Knowledge:Fair  Language: Good  Akathisia:  No  Handed:  Right  AIMS (if indicated):  not done  Assets:  Communication Skills Desire for Improvement Housing Resilience Social Support  ADL's:  Intact  Cognition: WNL  Sleep:  Fair   Screenings:   Assessment and Plan: Macaria is 48 year old Caucasian female who is referred from her primary care physician Bradly Bienenstock for medication management.  She is taking Zoloft 100 mg despite prescribed 200 mg daily.  She is also taking Xanax 0.5 mg half tablet for panic attacks.  She still struggle with anxiety, depression and panic attacks.  We discussed to try Klonopin since it is a longer half-life  than Xanax.  She agreed with the plan we will try Klonopin 2.5 mg half to 1 tablet as needed for panic attacks.  Recommended to try at least Zoloft 150 mg to help her residual anxiety and depression.  I do believe she should see a therapist for coping skills.  Patient told that she has no insurance and having a lot of financial burden to  her mother.  I recommended that she can try seeing at [facility] where patient can be seen without insurance.  She agreed to give a try however if she decided to come back with [place] but I will see her in 6 weeks.  Discussed medication side effects and benefits.  Discussed safety concerns at any time having active suicidal thoughts or homicidal thought that she need to call 911 or go to local emergency room.  We will discontinue Xanax.  She has upcoming appointment to see cardiology and psychiatry for disability evaluation.   [name], MD 2/18/202010:05 AM

## 2018-12-21 NOTE — Patient Instructions (Signed)
Contact [facility] for Medication Management. If you prefer to continue with [place] than call us for future appointment.

## 2019-02-01 ENCOUNTER — Other Ambulatory Visit: Payer: Self-pay

## 2019-02-01 ENCOUNTER — Ambulatory Visit (INDEPENDENT_AMBULATORY_CARE_PROVIDER_SITE_OTHER): Payer: Self-pay | Admitting: Psychiatry

## 2019-02-01 DIAGNOSIS — F411 Generalized anxiety disorder: Secondary | ICD-10-CM

## 2019-02-01 DIAGNOSIS — F410 Panic disorder [episodic paroxysmal anxiety] without agoraphobia: Secondary | ICD-10-CM

## 2019-02-01 DIAGNOSIS — F331 Major depressive disorder, recurrent, moderate: Secondary | ICD-10-CM

## 2019-02-01 MED ORDER — CLONAZEPAM 0.5 MG PO TABS: ORAL_TABLET | ORAL | 2 refills | Status: DC

## 2019-02-01 MED ORDER — SERTRALINE HCL 100 MG PO TABS: 150.0000 mg | ORAL_TABLET | Freq: Every day | ORAL | 2 refills | Status: DC

## 2019-02-01 NOTE — Progress Notes (Signed)
Virtual Visit via Telephone Note  I connected with Vicki Mcmillan on 02/01/19 at  3:00 PM EDT by telephone and verified that I am speaking with the correct person using two identifiers.   I discussed the limitations, risks, security and privacy concerns of performing an evaluation and management service by telephone and the availability of in person appointments. I also discussed with the patient that there may be a patient responsible charge related to this service. The patient expressed understanding and agreed to proceed.   History of Present Illness: Patient was evaluated through phone conversation.  She is a 48 year old Caucasian female who was seen first time on December 21, 2018.  She was experiencing increased anxiety, depression and stopped taking the medication because her previous psychiatrist refused to see her due to lack of insurance.  He was having poor sleep, anxiety attack, nervousness with feeling of hopelessness.  Her primary care physician prescribed Zoloft 200 mg but she was only taking 100 mg so she do not ran out.  She was taking Xanax which was helping some of the anxiety.  We recommended to try Klonopin and encouraged to take at least Zoloft 150 mg daily.  She seen much improvement with Klonopin.  She is sleeping better.  She is currently separated and she was hoping to reconcile with her husband but husband started to give more anxiety.  Patient told he has been calling every day and giving mixed messages.  There are days when he apologized and then other days he threatened to take him to the court.  She is very worried about him.  She is also struggling with memory impairment and some time poor attention and concentration.  Her disability hearing is postponed until July due to pandemic coronavirus.  She stays home and does not leave the house.  Her mother drives her to the doctor's appointment.  Due to pandemic she is not going to doctors offices.  She has upcoming appointment to  see neurocognitive testing on June 27 with Earley Brooke.  She is hoping to keep that appointment.  She is still concerned and nervous but feel medicine helps.  Since taking the Klonopin she has no more hallucinations.  She does not want to go to [facility] which we recommended due to lack of insurance because she is hoping to get her disability approved soon as she recently received a letter from [place] that if she did PFT test for her COPD and if she had a COPD she may get earlier disability.  She is hoping to get this test soon.  Patient denies any paranoia, hallucination, suicidal thoughts or homicidal thought.  She still feels isolated withdrawn with decreased energy but does not want to change the medication at this time.  She was taking Abilify with good response however due to unable to afford she cannot resume but hoping to get back when she get disability.  She reported no tremors, shakes or any EPS.  She denies any suicidal thoughts.  She is sleeping at least 7 to 8 hours at night with Klonopin.  She denies any panic attack.  She denies any feeling of hopelessness.  She denies drinking or using any illegal substances.  Past Psychiatric History: No history of suicidal attempt or any psychiatric inpatient treatment.  Started seeing psychiatrist after Chantix caused increased anxiety, hallucination and memory impairment.  Saw Dr. Jessy Oto at [facility] and prescribed Zoloft Abilify with good response.  PCP prescribed Xanax.   Mental status examination: Limited mental status  examination done on the phone.  Patient appears anxious but relevant and coherent.  Her thought process logical and goal-directed.  She denies any auditory or visual hallucination.  She denies any suicidal thoughts or any homicidal thought.  Her speech is clear.  She is alert and oriented x3.  Her fund of knowledge is adequate.  She does not appear to be distracted on the phone.  Her insight and judgment is okay.  Assessment and Plan: Panic  attacks, major depressive disorder, recurrent.  Generalized anxiety disorder.  Patient doing better since the last visit.  She feels the Klonopin helping much better than Xanax.  She also taking Zoloft 150 mg daily which is helping her anxiety and depression.  I encouraged to keep appointment with neurocognitive testing with Earley Brooke.  Encouraged to do PFT so she can get disability sooner.  She do not report any side effects.  Discussed benzodiazepine dependence tolerance and withdrawal.  I will continue Zoloft 150 mg daily and Klonopin 0.5 mg daily.  Recommended to call us back if she has any question or any concern.  Discussed safety concern that anytime having active suicidal thoughts or homicidal thought that she need to call 911 or go to local emergency room.  Follow-up in 2 months.  Follow Up Instructions:    I discussed the assessment and treatment plan with the patient. The patient was provided an opportunity to ask questions and all were answered. The patient agreed with the plan and demonstrated an understanding of the instructions.   The patient was advised to call back or seek an in-person evaluation if the symptoms worsen or if the condition fails to improve as anticipated.  I provided 15 minutes of non-face-to-face time during this encounter.   [name], MD

## 2019-03-31 ENCOUNTER — Encounter (HOSPITAL_COMMUNITY): Payer: Self-pay | Admitting: Psychiatry

## 2019-03-31 ENCOUNTER — Other Ambulatory Visit (HOSPITAL_COMMUNITY): Payer: Self-pay

## 2019-03-31 ENCOUNTER — Other Ambulatory Visit: Payer: Self-pay

## 2019-03-31 ENCOUNTER — Ambulatory Visit (INDEPENDENT_AMBULATORY_CARE_PROVIDER_SITE_OTHER): Payer: Self-pay | Admitting: Psychiatry

## 2019-03-31 DIAGNOSIS — F410 Panic disorder [episodic paroxysmal anxiety] without agoraphobia: Secondary | ICD-10-CM

## 2019-03-31 DIAGNOSIS — F331 Major depressive disorder, recurrent, moderate: Secondary | ICD-10-CM

## 2019-03-31 DIAGNOSIS — F411 Generalized anxiety disorder: Secondary | ICD-10-CM

## 2019-03-31 MED ORDER — CLONAZEPAM 0.5 MG PO TABS: ORAL_TABLET | ORAL | 2 refills | Status: DC

## 2019-03-31 MED ORDER — SERTRALINE HCL 100 MG PO TABS: 150.0000 mg | ORAL_TABLET | Freq: Every day | ORAL | 2 refills | Status: DC

## 2019-03-31 NOTE — Progress Notes (Signed)
Virtual Visit via Telephone Note  I connected with Vicki Mcmillan on 03/31/19 at  1:00 PM EDT by telephone and verified that I am speaking with the correct person using two identifiers.   I discussed the limitations, risks, security and privacy concerns of performing an evaluation and management service by telephone and the availability of in person appointments. I also discussed with the patient that there may be a patient responsible charge related to this service. The patient expressed understanding and agreed to proceed.   History of Present Illness: Patient was evaluated through phone session.  She is taking Zoloft 150 mg and Klonopin 0.5 mg for anxiety and panic attack.  She is doing much better.  She has court hearing for her disability on July 1 but she is not sure if it will happen due to COVID-19.  She still feels sometimes anxious and nervous but overall her sleep is improved.  She denies any tremors, shakes or any EPS.  She endorsed some time memory impairment as tend to forget things.  She has upcoming appointment to see neurology in [place].  She is also getting therapy from Earley Brooke on a regular basis.  She wants to continue Zoloft and Klonopin.  She denies any crying spells or any feeling of hopelessness or worthlessness.  Her energy level is good.  Her appetite is okay.  Past Psychiatric History:No history of suicidal attempt or any psychiatric inpatient treatment. Started seeing psychiatrist after Chantix caused increased anxiety, hallucination and memory impairment. Saw Dr. Jessy Oto at [facility] prescribed Zoloft Abilify with good response. PCP prescribed Xanax.    Psychiatric Specialty Exam: Physical Exam  ROS  There were no vitals taken for this visit.There is no height or weight on file to calculate BMI.  General Appearance: NA  Eye Contact:  NA  Speech:  Clear and Coherent  Volume:  Normal  Mood:  Anxious  Affect:  NA  Thought Process:  Goal Directed   Orientation:  Full (Time, Place, and Person)  Thought Content:  Logical  Suicidal Thoughts:  No  Homicidal Thoughts:  No  Memory:  Immediate;   Good Recent;   Good Remote;   Good  Judgement:  Good  Insight:  Good  Psychomotor Activity:  NA  Concentration:  Concentration: Fair and Attention Span: Fair  Recall:  Good  Fund of Knowledge:  Good  Language:  Good  Akathisia:  NA  Handed:  Right  AIMS (if indicated):     Assets:  Communication Skills Desire for Improvement Housing Resilience Transportation  ADL's:  Intact  Cognition:  WNL  Sleep:         Assessment and Plan: Panic attacks, major depressive disorder, recurrent.  Generalized anxiety disorder.  Patient is a stable on her current medication.  She is hoping to see neurology for her memory impairment.  Her upcoming court hearing for disability is on July 1.  She is hoping to get her disability approved.  Discussed medication side effects and benefits.  Continue Zoloft 150 mg daily and Klonopin 0.5 mg daily.  Discussed benzodiazepine dependence tolerance and withdrawal.  Encouraged to continue therapy.  Recommended to call us back if she has any question or any concern.  Follow-up in 3 months.  Follow Up Instructions:    I discussed the assessment and treatment plan with the patient. The patient was provided an opportunity to ask questions and all were answered. The patient agreed with the plan and demonstrated an understanding of the instructions.  The patient was advised to call back or seek an in-person evaluation if the symptoms worsen or if the condition fails to improve as anticipated.  I provided 15 minutes of non-face-to-face time during this encounter.   [name], MD

## 2019-06-30 ENCOUNTER — Other Ambulatory Visit: Payer: Self-pay

## 2019-06-30 ENCOUNTER — Ambulatory Visit (HOSPITAL_COMMUNITY): Payer: Self-pay | Admitting: Psychiatry

## 2019-07-01 ENCOUNTER — Other Ambulatory Visit: Payer: Self-pay

## 2019-07-01 ENCOUNTER — Ambulatory Visit (INDEPENDENT_AMBULATORY_CARE_PROVIDER_SITE_OTHER): Payer: Self-pay | Admitting: Psychiatry

## 2019-07-01 ENCOUNTER — Encounter (HOSPITAL_COMMUNITY): Payer: Self-pay | Admitting: Psychiatry

## 2019-07-01 DIAGNOSIS — F331 Major depressive disorder, recurrent, moderate: Secondary | ICD-10-CM

## 2019-07-01 DIAGNOSIS — F411 Generalized anxiety disorder: Secondary | ICD-10-CM

## 2019-07-01 DIAGNOSIS — F410 Panic disorder [episodic paroxysmal anxiety] without agoraphobia: Secondary | ICD-10-CM

## 2019-07-01 MED ORDER — SERTRALINE HCL 100 MG PO TABS: 150.0000 mg | ORAL_TABLET | Freq: Every day | ORAL | 2 refills | Status: DC

## 2019-07-01 MED ORDER — CLONAZEPAM 0.5 MG PO TABS: ORAL_TABLET | ORAL | 1 refills | Status: DC

## 2019-07-01 NOTE — Progress Notes (Signed)
Virtual Visit via Telephone Note  I connected with Vicki Mcmillan on 07/01/19 at 10:40 AM EDT by telephone and verified that I am speaking with the correct person using two identifiers.   I discussed the limitations, risks, security and privacy concerns of performing an evaluation and management service by telephone and the availability of in person appointments. I also discussed with the patient that there may be a patient responsible charge related to this service. The patient expressed understanding and agreed to proceed.   History of Present Illness: Patient was evaluated by phone session.  She is taking her medication which she feels working better.  Her sleep is improved but she still anxious about the current situation.  She is excepting that she is separated from her husband who now moved on in his life.  She is staying with her mother and things are going well.  Her disability court hearing has been canceled and she is still waiting for the new date.  She takes Klonopin 0.5 mg half to 1 tablet as needed.  She is taking Zoloft from 50 mg daily which is helping her.  She denies any irritability, anger, mood swing.  She is getting therapy from Earley Brooke on a regular basis.  She had appointment with neurology as she was concerned about the memory but it was canceled due to [place].  She feels her memory is better now but sometimes she needs to remind herself to do things and she writes on the paper.  Patient has no tremors, shakes or any EPS.  She denies any major panic attack.  She denies drinking or using any illegal substances.  She denies any feeling of hopelessness or worthlessness.   Past Psychiatric History:No history of suicidal attempt or any psychiatric inpatient treatment. Started seeing psychiatrist after Chantix caused increased anxiety, hallucination and memory impairment. Saw Dr. Jessy Oto at [facility] prescribed Zoloft Abilify with good response. PCP prescribed  Xanax.  Psychiatric Specialty Exam: Physical Exam  ROS  There were no vitals taken for this visit.There is no height or weight on file to calculate BMI.  General Appearance: NA  Eye Contact:  NA  Speech:  Clear and Coherent and Normal Rate  Volume:  Normal  Mood:  Anxious  Affect:  NA  Thought Process:  Goal Directed  Orientation:  Full (Time, Place, and Person)  Thought Content:  Rumination  Suicidal Thoughts:  No  Homicidal Thoughts:  No  Memory:  Immediate;   Good Recent;   Good Remote;   Good  Judgement:  Good  Insight:  Good  Psychomotor Activity:  NA  Concentration:  Concentration: Good and Attention Span: Good  Recall:  Good  Fund of Knowledge:  Good  Language:  Good  Akathisia:  No  Handed:  Right  AIMS (if indicated):     Assets:  Communication Skills Desire for Improvement Housing Resilience  ADL's:  Intact  Cognition:  WNL  Sleep:   improved      Assessment and Plan: Panic attacks.  Major depressive disorder, recurrent.  Generalized anxiety disorder.  Patient is a stable on her current medication.  She has no tremors shakes or any EPS.  She is waiting for her disability court hearing date.  She is living with her mother.  Continue Zoloft and 50 mg daily and Klonopin 0.5 mg half to 1 tablet as needed for panic attacks.  Discussed benzodiazepine dependence tolerance and withdrawal.  Recommended to call us back if she has any question or  any concern.  Encouraged to continue therapy with Earley Brooke.  Follow-up in 3 months  Follow Up Instructions:    I discussed the assessment and treatment plan with the patient. The patient was provided an opportunity to ask questions and all were answered. The patient agreed with the plan and demonstrated an understanding of the instructions.   The patient was advised to call back or seek an in-person evaluation if the symptoms worsen or if the condition fails to improve as anticipated.  I provided 20 minutes of  non-face-to-face time during this encounter.   [name], MD

## 2019-07-15 ENCOUNTER — Other Ambulatory Visit: Payer: Self-pay

## 2019-07-15 ENCOUNTER — Emergency Department (HOSPITAL_COMMUNITY): Payer: Self-pay

## 2019-07-15 ENCOUNTER — Encounter (HOSPITAL_COMMUNITY): Payer: Self-pay

## 2019-07-15 ENCOUNTER — Emergency Department (HOSPITAL_COMMUNITY)
Admission: EM | Admit: 2019-07-15 | Discharge: 2019-07-15 | Disposition: A | Discharge status: Home / Self Care | Payer: Self-pay | Attending: Emergency Medicine | Admitting: Emergency Medicine

## 2019-07-15 DIAGNOSIS — J449 Chronic obstructive pulmonary disease, unspecified: Secondary | ICD-10-CM | POA: Insufficient documentation

## 2019-07-15 DIAGNOSIS — Z79899 Other long term (current) drug therapy: Secondary | ICD-10-CM | POA: Insufficient documentation

## 2019-07-15 DIAGNOSIS — J069 Acute upper respiratory infection, unspecified: Secondary | ICD-10-CM | POA: Insufficient documentation

## 2019-07-15 DIAGNOSIS — Z7982 Long term (current) use of aspirin: Secondary | ICD-10-CM | POA: Insufficient documentation

## 2019-07-15 DIAGNOSIS — Z20828 Contact with and (suspected) exposure to other viral communicable diseases: Secondary | ICD-10-CM | POA: Insufficient documentation

## 2019-07-15 DIAGNOSIS — F17210 Nicotine dependence, cigarettes, uncomplicated: Secondary | ICD-10-CM | POA: Insufficient documentation

## 2019-07-15 LAB — URINALYSIS, ROUTINE W REFLEX MICROSCOPIC
Bacteria, UA: NONE SEEN
Bilirubin Urine: NEGATIVE
Glucose, UA: NEGATIVE mg/dL
Hgb urine dipstick: NEGATIVE
Ketones, ur: NEGATIVE mg/dL
Nitrite: NEGATIVE
Protein, ur: NEGATIVE mg/dL
Specific Gravity, Urine: 1.012 (ref 1.005–1.030)
pH: 5 (ref 5.0–8.0)

## 2019-07-15 LAB — COMPREHENSIVE METABOLIC PANEL
ALT: 18 U/L (ref 0–44)
AST: 20 U/L (ref 15–41)
Albumin: 4.2 g/dL (ref 3.5–5.0)
Alkaline Phosphatase: 78 U/L (ref 38–126)
Anion gap: 10 (ref 5–15)
BUN: 12 mg/dL (ref 6–20)
CO2: 25 mmol/L (ref 22–32)
Calcium: 9.1 mg/dL (ref 8.9–10.3)
Chloride: 107 mmol/L (ref 98–111)
Creatinine, Ser: 0.69 mg/dL (ref 0.44–1.00)
GFR calc Af Amer: >60 mL/min (ref >60)
GFR calc non Af Amer: >60 mL/min (ref >60)
Glucose, Bld: 88 mg/dL (ref 70–99)
Potassium: 4 mmol/L (ref 3.5–5.1)
Sodium: 142 mmol/L (ref 135–145)
Total Bilirubin: 1.1 mg/dL (ref 0.3–1.2)
Total Protein: 7.2 g/dL (ref 6.5–8.1)

## 2019-07-15 LAB — CBC WITH DIFFERENTIAL/PLATELET
Abs Immature Granulocytes: 0.01 10*3/uL (ref 0.00–0.07)
Basophils Absolute: 0 10*3/uL (ref 0.0–0.1)
Basophils Relative: 0 %
Eosinophils Absolute: 0.2 10*3/uL (ref 0.0–0.5)
Eosinophils Relative: 5 %
HCT: 42 % (ref 36.0–46.0)
Hemoglobin: 13.8 g/dL (ref 12.0–15.0)
Immature Granulocytes: 0 %
Lymphocytes Relative: 49 %
Lymphs Abs: 2.5 10*3/uL (ref 0.7–4.0)
MCH: 31.3 pg (ref 26.0–34.0)
MCHC: 32.9 g/dL (ref 30.0–36.0)
MCV: 95.2 fL (ref 80.0–100.0)
Monocytes Absolute: 0.3 10*3/uL (ref 0.1–1.0)
Monocytes Relative: 7 %
Neutro Abs: 2 10*3/uL (ref 1.7–7.7)
Neutrophils Relative %: 39 %
Platelets: 202 10*3/uL (ref 150–400)
RBC: 4.41 MIL/uL (ref 3.87–5.11)
RDW: 12.4 % (ref 11.5–15.5)
WBC: 5.1 10*3/uL (ref 4.0–10.5)
nRBC: 0 % (ref 0.0–0.2)

## 2019-07-15 MED ORDER — BENZONATATE 100 MG PO CAPS: 100.0000 mg | ORAL_CAPSULE | Freq: Three times a day (TID) | ORAL | 0 refills | Status: AC

## 2019-07-15 MED ORDER — SODIUM CHLORIDE 0.9 % IV BOLUS
1000.0000 mL | Freq: Once | INTRAVENOUS | Status: AC
  Administered 2019-07-15: 18:00:00 1000 mL via INTRAVENOUS

## 2019-07-15 MED ORDER — KETOROLAC TROMETHAMINE 30 MG/ML IJ SOLN
30.0000 mg | Freq: Once | INTRAMUSCULAR | Status: AC
  Administered 2019-07-15: 18:00:00 30 mg via INTRAVENOUS
  Filled 2019-07-15: qty 1

## 2019-07-15 NOTE — ED Provider Notes (Signed)
[facility] DEPT Provider Note   CSN: [id number] Arrival date & time: 07/15/19  1128     History   Chief Complaint Chief Complaint  Patient presents with  . Shortness of Breath  . Cough  . Nasal Congestion  . Sore Throat  . Fatigue    HPI Vicki Mcmillan is a 48 y.o. female with past medical history significant for COPD, tobacco use, and GERD who presents to the ED with 1 week history of productive cough with Ramsey Midgett sputum, headache, fatigue, sore throat, nasal congestion, and loose stools.  Patient also complains of slightly increased shortness of breath, chest discomfort when coughing, intermittent nausea, and lightheadedness upon standing.  Patient admits to slightly decreased p.o. intake.  Patient smokes cigarettes but is cut back substantially this week.  Tylenol has not helped with her headache, but her duo nebs have helped with her shortness of breath symptoms.  Patient is also taking Mucinex for cough, but with little relief.  Patient denies any fevers, chills, vomiting, neck stiffness, respiratory distress, back pain, blurred vision, dizziness, recent surgery, recent travel, sick contacts, or neurologic deficits.    HPI  Past Medical History:  Diagnosis Date  . Anxiety   . COPD (chronic obstructive pulmonary disease) ([place])   . Depression   . Panic attacks     Patient Active Problem List   Diagnosis Date Noted  . Tobacco use disorder 12/21/2018  . Tobacco use 09/09/2018  . Hypokalemia 09/09/2018  . GERD (gastroesophageal reflux disease) 09/09/2018  . Depression 09/08/2018  . COPD (chronic obstructive pulmonary disease) ([place]) 09/08/2018  . Anxiety 09/08/2018  . GAD (generalized anxiety disorder) 07/23/2016  . MDD (major depressive disorder), single episode, severe with psychosis ([place]) 07/23/2016    Past Surgical History:  Procedure Laterality Date  . TUBAL LIGATION    . WRIST SURGERY       OB History   No obstetric history on  file.      Home Medications    Prior to Admission medications   Medication Sig Start Date End Date Taking? Authorizing Provider  albuterol (PROVENTIL HFA;VENTOLIN HFA) 108 (90 Base) MCG/ACT inhaler Inhale 1-2 puffs into the lungs every 6 (six) hours as needed for wheezing or shortness of breath.    [provider]  aspirin EC 81 MG EC tablet Take 1 tablet (81 mg total) by mouth daily. 09/09/18   Johnson, Clanford L, MD  baclofen (LIORESAL) 10 MG tablet Take 10 mg by mouth daily. 12/07/18 12/07/19  [provider]  benzonatate (TESSALON) 100 MG capsule Take 1 capsule (100 mg total) by mouth every 8 (eight) hours for 7 days. 07/15/19 07/22/19  Corena Herter, PA-C  clonazePAM (KLONOPIN) 0.5 MG tablet Take 1/2 to one tab as needed for panic attack 07/01/19   Arfeen, Arlyce Harman, MD  ipratropium-albuterol (DUONEB) 0.5-2.5 (3) MG/3ML SOLN Take 3 mLs by nebulization 2 (two) times daily.    [provider]  sertraline (ZOLOFT) 100 MG tablet Take 1.5 tablets (150 mg total) by mouth daily. 07/01/19   Arfeen, Arlyce Harman, MD    Family History Family History  Problem Relation Age of Onset  . COPD Sister        Died in her 26s from it.  Marland Kitchen COPD Maternal Aunt   . COPD Father     Social History Social History   Tobacco Use  . Smoking status: Current Every Day Smoker    Packs/day: 0.50    Types: Cigarettes  .  Smokeless tobacco: Never Used  Substance Use Topics  . Alcohol use: No  . Drug use: No     Allergies   Hydrocodone-acetaminophen, Chantix [varenicline], Oxycodone, and Wellbutrin [bupropion]   Review of Systems Review of Systems Ten systems are reviewed and are negative for acute change except as noted in the HPI   Physical Exam Updated Vital Signs BP 103/71 (BP Location: Left Arm)   Pulse 81   Temp 98.4 F (36.9 C) (Oral)   Resp (!) 23   Ht 5\' 1"  (1.549 m)   Wt 48.5 kg   SpO2 95%   BMI 20.22 kg/m   Physical Exam Constitutional:      Appearance: Normal  appearance.  HENT:     Head: Normocephalic and atraumatic.     Mouth/Throat:     Pharynx: Oropharynx is clear. No oropharyngeal exudate.  Eyes:     General: No scleral icterus.    Conjunctiva/sclera: Conjunctivae normal.  Neck:     Musculoskeletal: Normal range of motion. No neck rigidity.  Cardiovascular:     Rate and Rhythm: Normal rate and regular rhythm.     Pulses: Normal pulses.     Heart sounds: Normal heart sounds.  Pulmonary:     Effort: Pulmonary effort is normal. No respiratory distress.     Breath sounds: Normal breath sounds. No wheezing.  Abdominal:     Palpations: Abdomen is soft.     Tenderness: There is no abdominal tenderness. There is no guarding.  Musculoskeletal:     Comments: No leg swelling.  Midsternal chest discomfort reproducible on physical exam with palpation.  Neurological:     General: No focal deficit present.     Mental Status: She is alert and oriented to person, place, and time.     GCS: GCS eye subscore is 4. GCS verbal subscore is 5. GCS motor subscore is 6.     Cranial Nerves: No cranial nerve deficit.     Sensory: No sensory deficit.     Coordination: Coordination normal.  Psychiatric:        Mood and Affect: Mood normal.        Behavior: Behavior normal.        Thought Content: Thought content normal.      ED Treatments / Results  Labs (all labs ordered are listed, but only abnormal results are displayed) Labs Reviewed  URINALYSIS, ROUTINE W REFLEX MICROSCOPIC - Abnormal; Notable for the following components:      Result Value   APPearance HAZY (*)    Leukocytes,Ua MODERATE (*)    All other components within normal limits  SARS CORONAVIRUS 2 (TAT 6-24 HRS)  COMPREHENSIVE METABOLIC PANEL  CBC WITH DIFFERENTIAL/PLATELET    EKG None  Radiology Dg Chest 2 View  Result Date: 07/15/2019 CLINICAL DATA:  Cough, shortness of breath, congestion EXAM: CHEST - 2 VIEW COMPARISON:  09/08/2018 FINDINGS: The heart size and mediastinal  contours are within normal limits. Both lungs are clear. The visualized skeletal structures are unremarkable. IMPRESSION: No acute abnormality of the lungs. Electronically Signed   By: Eddie Candle M.D.   On: 07/15/2019 12:52    Procedures Procedures (including critical care time)  Medications Ordered in ED Medications  sodium chloride 0.9 % bolus 1,000 mL (1,000 mLs Intravenous New Bag/Given (Non-Interop) 07/15/19 1806)  ketorolac (TORADOL) 30 MG/ML injection 30 mg (30 mg Intravenous Given 07/15/19 1806)     Initial Impression / Assessment and Plan / ED Course  I have reviewed the  triage vital signs and the nursing notes.  Pertinent labs & imaging results that were available during my care of the patient were reviewed by me and considered in my medical decision making (see chart for details).       Patient's history and physical exam is concerning for COVID-19.  Will perform diagnostic test outpatient given patient's vitals are stable, can tolerate p.o., and her vitals are stable.  Chest discomfort was reproducible with palpation on physical exam. EKG was reviewed and reassuring. Provided patient with liter bolus and IV Toradol for pain.  Orthostatic vitals were within normal limits.   Chest x-ray was interpreted and does not demonstrate any consolidation concerning for pneumonia.  Patient has been afebrile and I do not believe that antibiotics are indicated at this point.  Patient admits to headaches in the past and she denies any thunderclap presentation.  States this was insidious onset.  No trauma or neurologic findings. Afebrile.  Patient's presentation could also be acute COPD exacerbation, but need to rule out coronavirus.  Management would be unchanged.  Tessalon Perles prescribed for antitussive relief.  Discharging with strict return precautions.   Patient voiced understanding is agreeable to plan.  Final Clinical Impressions(s) / ED Diagnoses   Final diagnoses:  Viral upper  respiratory tract infection    ED Discharge Orders         Ordered    benzonatate (TESSALON) 100 MG capsule  Every 8 hours     07/15/19 2038           Corena Herter, PA-C 07/16/19 0143    Fredia Sorrow, MD 07/30/19 [phone]

## 2019-07-15 NOTE — ED Notes (Signed)
Pt requested ham and cheese sandwich and ginger ale. Pt is eating and does not appear to be in immediate distress.

## 2019-07-15 NOTE — ED Triage Notes (Addendum)
Patient c/o increased SOB, a productive cough with green sputum, and nasal congestion with green discharge. patient also c/o sore throat and fatigue, N/D, but denies a fever. Patient states she went to PCP and UC and was told to come to the ED. Patient has a history of COPD.

## 2019-07-15 NOTE — Discharge Instructions (Signed)
You have been tested for COVID-19, results may take 24 to 36 hours.  Please quarantine until results are posted.  Emphasize importance of oral hydration and please take ibuprofen or Tylenol as needed for pain discomfort.  Please return for fever that is not relieved by Tylenol and ibuprofen, respiratory distress, chest pain, or any other new or worsening symptoms.     Person Under Monitoring Name: Vicki Mcmillan  Location: [address] [place] [address]   Infection Prevention Recommendations for Individuals Confirmed to have, or Being Evaluated for, 2019 Novel Coronavirus (COVID-19) Infection Who Receive Care at Home  Individuals who are confirmed to have, or are being evaluated for, COVID-19 should follow the prevention steps below until a healthcare provider or local or state health department says they can return to normal activities.  Stay home except to get medical care You should restrict activities outside your home, except for getting medical care. Do not go to work, school, or public areas, and do not use public transportation or taxis.  Call ahead before visiting your doctor Before your medical appointment, call the healthcare provider and tell them that you have, or are being evaluated for, COVID-19 infection. This will help the healthcare providers office take steps to keep other people from getting infected. Ask your healthcare provider to call the local or state health department.  Monitor your symptoms Seek prompt medical attention if your illness is worsening (e.g., difficulty breathing). Before going to your medical appointment, call the healthcare provider and tell them that you have, or are being evaluated for, COVID-19 infection. Ask your healthcare provider to call the local or state health department.  Wear a facemask You should wear a facemask that covers your nose and mouth when you are in the same room with other people and when you visit a  healthcare provider. People who live with or visit you should also wear a facemask while they are in the same room with you.  Separate yourself from other people in your home As much as possible, you should stay in a different room from other people in your home. Also, you should use a separate bathroom, if available.  Avoid sharing household items You should not share dishes, drinking glasses, cups, eating utensils, towels, bedding, or other items with other people in your home. After using these items, you should wash them thoroughly with soap and water.  Cover your coughs and sneezes Cover your mouth and nose with a tissue when you cough or sneeze, or you can cough or sneeze into your sleeve. Throw used tissues in a lined trash can, and immediately wash your hands with soap and water for at least 20 seconds or use an alcohol-based hand rub.  Wash your [facility] your hands often and thoroughly with soap and water for at least 20 seconds. You can use an alcohol-based hand sanitizer if soap and water are not available and if your hands are not visibly dirty. Avoid touching your eyes, nose, and mouth with unwashed hands.   Prevention Steps for Caregivers and Household Members of Individuals Confirmed to have, or Being Evaluated for, COVID-19 Infection Being Cared for in the Home  If you live with, or provide care at home for, a person confirmed to have, or being evaluated for, COVID-19 infection please follow these guidelines to prevent infection:  Follow healthcare providers instructions Make sure that you understand and can help the patient follow any healthcare provider instructions for all care.  Provide for  the patients basic needs You should help the patient with basic needs in the home and provide support for getting groceries, prescriptions, and other personal needs.  Monitor the patients symptoms If they are getting sicker, call his or her medical provider and tell  them that the patient has, or is being evaluated for, COVID-19 infection. This will help the healthcare providers office take steps to keep other people from getting infected. Ask the healthcare provider to call the local or state health department.  Limit the number of people who have contact with the patient If possible, have only one caregiver for the patient. Other household members should stay in another home or place of residence. If this is not possible, they should stay in another room, or be separated from the patient as much as possible. Use a separate bathroom, if available. Restrict visitors who do not have an essential need to be in the home.  Keep older adults, very young children, and other sick people away from the patient Keep older adults, very young children, and those who have compromised immune systems or chronic health conditions away from the patient. This includes people with chronic heart, lung, or kidney conditions, diabetes, and cancer.  Ensure good ventilation Make sure that shared spaces in the home have good air flow, such as from an air conditioner or an opened window, weather permitting.  Wash your hands often Wash your hands often and thoroughly with soap and water for at least 20 seconds. You can use an alcohol based hand sanitizer if soap and water are not available and if your hands are not visibly dirty. Avoid touching your eyes, nose, and mouth with unwashed hands. Use disposable paper towels to dry your hands. If not available, use dedicated cloth towels and replace them when they become wet.  Wear a facemask and gloves Wear a disposable facemask at all times in the room and gloves when you touch or have contact with the patients blood, body fluids, and/or secretions or excretions, such as sweat, saliva, sputum, nasal mucus, vomit, urine, or feces.  Ensure the mask fits over your nose and mouth tightly, and do not touch it during use. Throw out  disposable facemasks and gloves after using them. Do not reuse. Wash your hands immediately after removing your facemask and gloves. If your personal clothing becomes contaminated, carefully remove clothing and launder. Wash your hands after handling contaminated clothing. Place all used disposable facemasks, gloves, and other waste in a lined container before disposing them with other household waste. Remove gloves and wash your hands immediately after handling these items.  Do not share dishes, glasses, or other household items with the patient Avoid sharing household items. You should not share dishes, drinking glasses, cups, eating utensils, towels, bedding, or other items with a patient who is confirmed to have, or being evaluated for, COVID-19 infection. After the person uses these items, you should wash them thoroughly with soap and water.  Wash laundry thoroughly Immediately remove and wash clothes or bedding that have blood, body fluids, and/or secretions or excretions, such as sweat, saliva, sputum, nasal mucus, vomit, urine, or feces, on them. Wear gloves when handling laundry from the patient. Read and follow directions on labels of laundry or clothing items and detergent. In general, wash and dry with the warmest temperatures recommended on the label.  Clean all areas the individual has used often Clean all touchable surfaces, such as counters, tabletops, doorknobs, bathroom fixtures, toilets, phones, keyboards, tablets, and bedside  tables, every day. Also, clean any surfaces that may have blood, body fluids, and/or secretions or excretions on them. Wear gloves when cleaning surfaces the patient has come in contact with. Use a diluted bleach solution (e.g., dilute bleach with 1 part bleach and 10 parts water) or a household disinfectant with a label that says EPA-registered for coronaviruses. To make a bleach solution at home, add 1 tablespoon of bleach to 1 quart (4 cups) of water.  For a larger supply, add  cup of bleach to 1 gallon (16 cups) of water. Read labels of cleaning products and follow recommendations provided on product labels. Labels contain instructions for safe and effective use of the cleaning product including precautions you should take when applying the product, such as wearing gloves or eye protection and making sure you have good ventilation during use of the product. Remove gloves and wash hands immediately after cleaning.  Monitor yourself for signs and symptoms of illness Caregivers and household members are considered close contacts, should monitor their health, and will be asked to limit movement outside of the home to the extent possible. Follow the monitoring steps for close contacts listed on the symptom monitoring form.   ? If you have additional questions, contact your local health department or call the epidemiologist on call at [phone] (available 24/7). ? This guidance is subject to change. For the most up-to-date guidance from [facility], please refer to their website: [email or web address]

## 2019-07-16 LAB — SARS CORONAVIRUS 2 (TAT 6-24 HRS): SARS Coronavirus 2: NEGATIVE

## 2019-09-27 ENCOUNTER — Ambulatory Visit (INDEPENDENT_AMBULATORY_CARE_PROVIDER_SITE_OTHER): Payer: Self-pay | Admitting: Psychiatry

## 2019-09-27 ENCOUNTER — Other Ambulatory Visit: Payer: Self-pay

## 2019-09-27 ENCOUNTER — Encounter (HOSPITAL_COMMUNITY): Payer: Self-pay | Admitting: Psychiatry

## 2019-09-27 DIAGNOSIS — F331 Major depressive disorder, recurrent, moderate: Secondary | ICD-10-CM

## 2019-09-27 DIAGNOSIS — F410 Panic disorder [episodic paroxysmal anxiety] without agoraphobia: Secondary | ICD-10-CM

## 2019-09-27 DIAGNOSIS — F411 Generalized anxiety disorder: Secondary | ICD-10-CM

## 2019-09-27 MED ORDER — CLONAZEPAM 0.5 MG PO TABS: ORAL_TABLET | ORAL | 1 refills | Status: DC

## 2019-09-27 MED ORDER — SERTRALINE HCL 100 MG PO TABS: 150.0000 mg | ORAL_TABLET | Freq: Every day | ORAL | 2 refills | Status: DC

## 2019-09-27 NOTE — Progress Notes (Signed)
Virtual Visit via Telephone Note  I connected with Vicki Mcmillan on 09/27/19 at  2:00 PM EST by telephone and verified that I am speaking with the correct person using two identifiers.   I discussed the limitations, risks, security and privacy concerns of performing an evaluation and management service by telephone and the availability of in person appointments. I also discussed with the patient that there may be a patient responsible charge related to this service. The patient expressed understanding and agreed to proceed.   History of Present Illness: Patient was evaluated by phone session.  She admitted lately some anxiety especially at night and she has difficulty falling asleep.  Recently she was seen in emergency room because of breathing issues when she was very nervous that she may have corona but finally she had tested in the ER and it was negative.  She feels the Zoloft works most of the time as she feels not as irritable and depressed.  She is living with her mother.  She is a still separated from her husband.  She denies any feeling of hopelessness or worthlessness.  Her energy level is good.  She is a still waiting for her disability court hearing which was canceled.  She has no tremors, shakes or any EPS.  She takes Klonopin as needed for anxiety and panic attacks.  She admitted not able to see her therapist Earley Brooke but hoping to reschedule appointment soon.   Past Psychiatric History:No history of suicidal attempt or any psychiatric inpatient treatment. Started seeing psychiatrist after Chantix caused increased anxiety, hallucination and memory impairment. Saw Dr. Jessy Oto at [facility] prescribed Zoloft Abilify with good response. PCP prescribed Xanax.    Psychiatric Specialty Exam: Physical Exam  ROS  There were no vitals taken for this visit.There is no height or weight on file to calculate BMI.  General Appearance: NA  Eye Contact:  NA  Speech:  Clear and Coherent   Volume:  Normal  Mood:  Anxious  Affect:  NA  Thought Process:  Goal Directed  Orientation:  Full (Time, Place, and Person)  Thought Content:  WDL  Suicidal Thoughts:  No  Homicidal Thoughts:  No  Memory:  Immediate;   Good Recent;   Good Remote;   Good  Judgement:  Good  Insight:  Good  Psychomotor Activity:  NA  Concentration:  Concentration: Fair and Attention Span: Fair  Recall:  Good  Fund of Knowledge:  Good  Language:  Good  Akathisia:  No  Handed:  Right  AIMS (if indicated):     Assets:  Communication Skills Desire for Improvement Housing Resilience  ADL's:  Intact  Cognition:  WNL  Sleep:   fair      Assessment and Plan: Panic attacks.  Major depressive disorder, recurrent.  Generalized anxiety disorder.  Patient experiencing increased anxiety and sometimes insomnia.  I recommend to take the Klonopin 0.5 mg whole tablet as needed to help the panic attacks and anxiety.  I also encouraged that she should resume therapy with Earley Brooke for CBT.  Continue Zoloft 150 mg daily.  Recommended to call us back if she is any question or any concern.  Follow-up in 3 months.  Follow Up Instructions:    I discussed the assessment and treatment plan with the patient. The patient was provided an opportunity to ask questions and all were answered. The patient agreed with the plan and demonstrated an understanding of the instructions.   The patient was advised to call back  or seek an in-person evaluation if the symptoms worsen or if the condition fails to improve as anticipated.  I provided 20 minutes of non-face-to-face time during this encounter.   Vicki Nations, MD

## 2019-09-28 ENCOUNTER — Ambulatory Visit (HOSPITAL_COMMUNITY): Payer: Self-pay | Admitting: Psychiatry

## 2019-10-18 ENCOUNTER — Encounter (HOSPITAL_COMMUNITY): Payer: Self-pay | Admitting: Emergency Medicine

## 2019-10-18 ENCOUNTER — Emergency Department (HOSPITAL_COMMUNITY)
Admission: EM | Admit: 2019-10-18 | Discharge: 2019-10-18 | Disposition: A | Discharge status: Home / Self Care | Payer: Self-pay | Attending: Emergency Medicine | Admitting: Emergency Medicine

## 2019-10-18 ENCOUNTER — Emergency Department (HOSPITAL_COMMUNITY): Payer: Self-pay

## 2019-10-18 ENCOUNTER — Other Ambulatory Visit: Payer: Self-pay

## 2019-10-18 DIAGNOSIS — Z7982 Long term (current) use of aspirin: Secondary | ICD-10-CM | POA: Insufficient documentation

## 2019-10-18 DIAGNOSIS — R0981 Nasal congestion: Secondary | ICD-10-CM

## 2019-10-18 DIAGNOSIS — J449 Chronic obstructive pulmonary disease, unspecified: Secondary | ICD-10-CM | POA: Insufficient documentation

## 2019-10-18 DIAGNOSIS — F17210 Nicotine dependence, cigarettes, uncomplicated: Secondary | ICD-10-CM | POA: Insufficient documentation

## 2019-10-18 DIAGNOSIS — Z79899 Other long term (current) drug therapy: Secondary | ICD-10-CM | POA: Insufficient documentation

## 2019-10-18 DIAGNOSIS — J069 Acute upper respiratory infection, unspecified: Secondary | ICD-10-CM | POA: Insufficient documentation

## 2019-10-18 DIAGNOSIS — Z20822 Contact with and (suspected) exposure to covid-19: Secondary | ICD-10-CM

## 2019-10-18 DIAGNOSIS — Z20828 Contact with and (suspected) exposure to other viral communicable diseases: Secondary | ICD-10-CM | POA: Insufficient documentation

## 2019-10-18 LAB — SARS CORONAVIRUS 2 (TAT 6-24 HRS): SARS Coronavirus 2: NEGATIVE

## 2019-10-18 MED ORDER — FLUTICASONE PROPIONATE 50 MCG/ACT NA SUSP: 2.0000 | Freq: Every day | NASAL | 0 refills | Status: DC

## 2019-10-18 MED ORDER — CETIRIZINE-PSEUDOEPHEDRINE ER 5-120 MG PO TB12: 1.0000 | ORAL_TABLET | Freq: Every day | ORAL | 0 refills | Status: DC

## 2019-10-18 MED ORDER — BENZONATATE 100 MG PO CAPS: 100.0000 mg | ORAL_CAPSULE | Freq: Three times a day (TID) | ORAL | 0 refills | Status: DC

## 2019-10-18 NOTE — ED Provider Notes (Signed)
[facility] DEPT Provider Note   CSN: [id number] Arrival date & time: 10/18/19  1301     History Chief Complaint  Patient presents with  . Cough    Vicki Mcmillan is a 48 y.o. female history of COPD  HPI Patient states she has been coughing for 2 weeks which is consistent, unchanged, nonpainful with occasional coughing fits that last several seconds which caused patient to feel short of breath.  Patient denies any shortness breath at rest.  States she has not been Covid tested and is amenable to being tested today.  Denies any fevers but endorses occasional chills.  Has associated diarrhea and fatigue.  Denies any chest pain although she has chest discomfort when she is having a coughing fit.  Denies any exertional chest pain.  Patient endorses sinus congestion as well some sinus pressure. Continues with past several days.  Denies any fevers.  Denies any history of bacterial sinus infections that required antibiotics in the past.  Patient states that she has used over-the-counter cough medications with no help.  Patient also endorses occasional lightheadedness with standing.  States that she does not drink any water drinks orange juice daily.  States that she has been eating normally for the most part but does have decreased appetite.     Past Medical History:  Diagnosis Date  . Anxiety   . COPD (chronic obstructive pulmonary disease) ([place])   . Depression   . Panic attacks     Patient Active Problem List   Diagnosis Date Noted  . Tobacco use disorder 12/21/2018  . Tobacco use 09/09/2018  . Hypokalemia 09/09/2018  . GERD (gastroesophageal reflux disease) 09/09/2018  . Depression 09/08/2018  . COPD (chronic obstructive pulmonary disease) ([place]) 09/08/2018  . Anxiety 09/08/2018  . GAD (generalized anxiety disorder) 07/23/2016  . MDD (major depressive disorder), single episode, severe with psychosis ([place]) 07/23/2016    Past Surgical History:   Procedure Laterality Date  . TUBAL LIGATION    . WRIST SURGERY       OB History   No obstetric history on file.     Family History  Problem Relation Age of Onset  . COPD Sister        Died in her 51s from it.  Marland Kitchen COPD Maternal Aunt   . COPD Father     Social History   Tobacco Use  . Smoking status: Current Every Day Smoker    Packs/day: 0.50    Types: Cigarettes  . Smokeless tobacco: Never Used  Substance Use Topics  . Alcohol use: No  . Drug use: No    Home Medications Prior to Admission medications   Medication Sig Start Date End Date Taking? Authorizing Provider  albuterol (PROVENTIL HFA;VENTOLIN HFA) 108 (90 Base) MCG/ACT inhaler Inhale 1-2 puffs into the lungs every 6 (six) hours as needed for wheezing or shortness of breath.    [provider]  aspirin EC 81 MG EC tablet Take 1 tablet (81 mg total) by mouth daily. 09/09/18   Johnson, Clanford L, MD  baclofen (LIORESAL) 10 MG tablet Take 10 mg by mouth daily. 12/07/18 12/07/19  [provider]  benzonatate (TESSALON) 100 MG capsule Take 1 capsule (100 mg total) by mouth every 8 (eight) hours. 10/18/19   Tedd Sias, PA  cetirizine-pseudoephedrine (ZYRTEC-D) 5-120 MG tablet Take 1 tablet by mouth daily. 10/18/19   Tedd Sias, PA  clonazePAM (KLONOPIN) 0.5 MG tablet Take 1/2 to one tab as needed for  panic attack 09/27/19   Arfeen, Arlyce Harman, MD  fluticasone (FLONASE) 50 MCG/ACT nasal spray Place 2 sprays into both nostrils daily. 10/18/19   Tedd Sias, PA  ipratropium-albuterol (DUONEB) 0.5-2.5 (3) MG/3ML SOLN Take 3 mLs by nebulization 2 (two) times daily.    [provider]  sertraline (ZOLOFT) 100 MG tablet Take 1.5 tablets (150 mg total) by mouth daily. 09/27/19   [name], MD    Allergies    Hydrocodone-acetaminophen, Chantix [varenicline], Oxycodone, and Wellbutrin [bupropion]  Review of Systems   Review of Systems  Constitutional: Positive for chills and fatigue.  Negative for fever.  HENT: Positive for congestion, rhinorrhea and sinus pressure. Negative for sore throat.   Respiratory: Positive for cough, chest tightness and shortness of breath.   Cardiovascular: Negative for chest pain, palpitations and leg swelling.  Gastrointestinal: Negative for abdominal distention and abdominal pain.  Musculoskeletal: Positive for myalgias. Negative for neck pain and neck stiffness.  Skin: Negative for rash.  Neurological: Positive for light-headedness. Negative for dizziness and headaches.    Physical Exam Updated Vital Signs BP 114/79 (BP Location: Right Arm)   Pulse 74   Temp 98.3 F (36.8 C) (Oral)   Resp 18   SpO2 98%   Physical Exam Vitals and nursing note reviewed.  Constitutional:      General: She is not in acute distress.    Comments: Patient is well-appearing 48 year old female no acute distress  HENT:     Head: Normocephalic and atraumatic.     Nose: Rhinorrhea present.     Mouth/Throat:     Mouth: Mucous membranes are dry.     Pharynx: No oropharyngeal exudate or posterior oropharyngeal erythema.  Eyes:     General: No scleral icterus. Cardiovascular:     Rate and Rhythm: Normal rate and regular rhythm.     Pulses: Normal pulses.     Heart sounds: Normal heart sounds.  Pulmonary:     Effort: Pulmonary effort is normal. No respiratory distress.     Breath sounds: No wheezing.     Comments: No increased work of breathing, no respiratory distress, no abnormal lung sounds.  Speaking in full sentences.  Not dyspneic or tachypneic Abdominal:     Palpations: Abdomen is soft.     Tenderness: There is no abdominal tenderness.  Musculoskeletal:     Cervical back: Normal range of motion.     Right lower leg: No edema.     Left lower leg: No edema.  Skin:    General: Skin is warm and dry.     Capillary Refill: Capillary refill takes less than 2 seconds.  Neurological:     Mental Status: She is alert. Mental status is at baseline.    Psychiatric:        Mood and Affect: Mood normal.        Behavior: Behavior normal.     ED Results / Procedures / Treatments   Labs (all labs ordered are listed, but only abnormal results are displayed) Labs Reviewed  SARS CORONAVIRUS 2 (TAT 6-24 HRS)    EKG None  Radiology DG Chest Portable 1 View  Result Date: 10/18/2019 CLINICAL DATA:  Cough. EXAM: PORTABLE CHEST 1 VIEW COMPARISON:  July 15, 2019. FINDINGS: The heart size and mediastinal contours are within normal limits. Both lungs are clear. The visualized skeletal structures are unremarkable. IMPRESSION: No active disease. Electronically Signed   By: Marijo Conception M.D.   On: 10/18/2019 15:05  Procedures Procedures (including critical care time)  Medications Ordered in ED Medications - No data to display  ED Course  I have reviewed the triage vital signs and the nursing notes.  Pertinent labs & imaging results that were available during my care of the patient were reviewed by me and considered in my medical decision making (see chart for details).    MDM Rules/Calculators/A&P                       Patient is 48 year old female presents today with history of COPD chief complaint of congestion, cough, chills, body aches, shortness of breath associated with her coughing fits.  Patient has not been tested for COVID-19.  Think it is reasonable to test for that at this time.  Physical exam is unremarkable has no increased work of breathing is not dyspneic has SPO2 98% on room air.  Is afebrile.  Vitals are within normal limits.  Chest pain is associated with coughing fits does not occur at rest or with exertion.  It is nonradiating and does not have any concerning features for ACS.  Doubt pneumonia as patient has clear chest x-ray and is afebrile.  Most likely patient is coughing up mucus drainage that is causing her sinus congestion and sinus pressure.  Patient is well-appearing.  Will discharge with  symptomatic control in the form of Zyrtec, fluticasone, benzonatate.  Will recommend use Tylenol for her discomfort.  Less likely this patient has Covid patient given return precautions.  Patient understanding plan.   Vicki Mcmillan was evaluated in Emergency Department on 10/18/2019 for the symptoms described in the history of present illness. She was evaluated in the context of the global COVID-19 pandemic, which necessitated consideration that the patient might be at risk for infection with the SARS-CoV-2 virus that causes COVID-19. Institutional protocols and algorithms that pertain to the evaluation of patients at risk for COVID-19 are in a state of rapid change based on information released by regulatory bodies including the CDC and federal and state organizations. These policies and algorithms were followed during the patient's care in the ED.     This patient appears reasonably screened and I doubt any other medical condition requiring further workup, evaluation, or treatment in the ED at this time prior to discharge.   Patient's vitals are WNL apart from vital sign abnormalities discussed above, patient is in NAD, and able to ambulate in the ED at their baseline. Pain has been managed or a plan has been made for home management and has no complaints prior to discharge. Patient is comfortable with above plan and is stable for discharge at this time. All questions were answered prior to disposition. Results from the ER workup discussed with the patient face to face and all questions answered to the best of my ability. The patient is safe for discharge with strict return precautions. Patient appears safe for discharge with appropriate follow-up. Conveyed my impression with the patient and they voiced understanding and are agreeable to plan.   An After Visit Summary was printed and given to the patient.  Portions of this note were generated with [employer]. Dictation errors may  occur despite best attempts at proofreading.    Final Clinical Impression(s) / ED Diagnoses Final diagnoses:  Viral URI with cough  Suspected COVID-19 virus infection  Complaint of nasal congestion    Rx / DC Orders ED Discharge Orders         Ordered  cetirizine-pseudoephedrine (ZYRTEC-D) 5-120 MG tablet  Daily     10/18/19 1526    fluticasone (FLONASE) 50 MCG/ACT nasal spray  Daily     10/18/19 1526    benzonatate (TESSALON) 100 MG capsule  Every 8 hours     10/18/19 Anton, Judah Carchi [place], [place] 10/18/19 1531    Carmin Muskrat, MD 10/19/19 [phone]

## 2019-10-18 NOTE — Discharge Instructions (Addendum)
Viral Illness TREATMENT  Treatment is directed at relieving symptoms. There is no cure. Antibiotics are not effective, because the infection is caused by a virus, not by bacteria. Treatment may include:  Increased fluid intake. Sports drinks offer valuable electrolytes, sugars, and fluids.  Breathing heated mist or steam (vaporizer or shower).  Eating chicken soup or other clear broths, and maintaining good nutrition.  Getting plenty of rest.  Using gargles or lozenges for comfort.  Increasing usage of your inhaler if you have asthma.  Return to work when your temperature has returned to normal.  Gargle warm salt water and spit it out for sore throat. Take benadryl to decrease sinus secretions. Continue to alternate between Tylenol and ibuprofen for pain and fever control.  Follow Up: Follow up with your primary care doctor in 5-7 days for recheck of ongoing symptoms.  Return to emergency department for emergent changing or worsening of symptoms.       Person Under Monitoring Name: Vicki Mcmillan  Location: [address] [place] [address]   Infection Prevention Recommendations for Individuals Confirmed to have, or Being Evaluated for, 2019 Novel Coronavirus (COVID-19) Infection Who Receive Care at Home  Individuals who are confirmed to have, or are being evaluated for, COVID-19 should follow the prevention steps below until a healthcare provider or local or state health department says they can return to normal activities.  Stay home except to get medical care You should restrict activities outside your home, except for getting medical care. Do not go to work, school, or public areas, and do not use public transportation or taxis.  Call ahead before visiting your doctor Before your medical appointment, call the healthcare provider and tell them that you have, or are being evaluated for, COVID-19 infection. This will help the healthcare provider's office take steps to  keep other people from getting infected. Ask your healthcare provider to call the local or state health department.  Monitor your symptoms Seek prompt medical attention if your illness is worsening (e.g., difficulty breathing). Before going to your medical appointment, call the healthcare provider and tell them that you have, or are being evaluated for, COVID-19 infection. Ask your healthcare provider to call the local or state health department.  Wear a facemask You should wear a facemask that covers your nose and mouth when you are in the same room with other people and when you visit a healthcare provider. People who live with or visit you should also wear a facemask while they are in the same room with you.  Separate yourself from other people in your home As much as possible, you should stay in a different room from other people in your home. Also, you should use a separate bathroom, if available.  Avoid sharing household items You should not share dishes, drinking glasses, cups, eating utensils, towels, bedding, or other items with other people in your home. After using these items, you should wash them thoroughly with soap and water.  Cover your coughs and sneezes Cover your mouth and nose with a tissue when you cough or sneeze, or you can cough or sneeze into your sleeve. Throw used tissues in a lined trash can, and immediately wash your hands with soap and water for at least 20 seconds or use an alcohol-based hand rub.  Wash your [facility] your hands often and thoroughly with soap and water for at least 20 seconds. You can use an alcohol-based hand sanitizer if soap and water are not available  and if your hands are not visibly dirty. Avoid touching your eyes, nose, and mouth with unwashed hands.   Prevention Steps for Caregivers and Household Members of Individuals Confirmed to have, or Being Evaluated for, COVID-19 Infection Being Cared for in the Home  If you live with,  or provide care at home for, a person confirmed to have, or being evaluated for, COVID-19 infection please follow these guidelines to prevent infection:  Follow healthcare provider's instructions Make sure that you understand and can help the patient follow any healthcare provider instructions for all care.  Provide for the patient's basic needs You should help the patient with basic needs in the home and provide support for getting groceries, prescriptions, and other personal needs.  Monitor the patient's symptoms If they are getting sicker, call his or her medical provider and tell them that the patient has, or is being evaluated for, COVID-19 infection. This will help the healthcare provider's office take steps to keep other people from getting infected. Ask the healthcare provider to call the local or state health department.  Limit the number of people who have contact with the patient If possible, have only one caregiver for the patient. Other household members should stay in another home or place of residence. If this is not possible, they should stay in another room, or be separated from the patient as much as possible. Use a separate bathroom, if available. Restrict visitors who do not have an essential need to be in the home.  Keep older adults, very young children, and other sick people away from the patient Keep older adults, very young children, and those who have compromised immune systems or chronic health conditions away from the patient. This includes people with chronic heart, lung, or kidney conditions, diabetes, and cancer.  Ensure good ventilation Make sure that shared spaces in the home have good air flow, such as from an air conditioner or an opened window, weather permitting.  Wash your hands often Wash your hands often and thoroughly with soap and water for at least 20 seconds. You can use an alcohol based hand sanitizer if soap and water are not available and if  your hands are not visibly dirty. Avoid touching your eyes, nose, and mouth with unwashed hands. Use disposable paper towels to dry your hands. If not available, use dedicated cloth towels and replace them when they become wet.  Wear a facemask and gloves Wear a disposable facemask at all times in the room and gloves when you touch or have contact with the patient's blood, body fluids, and/or secretions or excretions, such as sweat, saliva, sputum, nasal mucus, vomit, urine, or feces.  Ensure the mask fits over your nose and mouth tightly, and do not touch it during use. Throw out disposable facemasks and gloves after using them. Do not reuse. Wash your hands immediately after removing your facemask and gloves. If your personal clothing becomes contaminated, carefully remove clothing and launder. Wash your hands after handling contaminated clothing. Place all used disposable facemasks, gloves, and other waste in a lined container before disposing them with other household waste. Remove gloves and wash your hands immediately after handling these items.  Do not share dishes, glasses, or other household items with the patient Avoid sharing household items. You should not share dishes, drinking glasses, cups, eating utensils, towels, bedding, or other items with a patient who is confirmed to have, or being evaluated for, COVID-19 infection. After the person uses these items, you should wash  them thoroughly with soap and water.  Wash laundry thoroughly Immediately remove and wash clothes or bedding that have blood, body fluids, and/or secretions or excretions, such as sweat, saliva, sputum, nasal mucus, vomit, urine, or feces, on them. Wear gloves when handling laundry from the patient. Read and follow directions on labels of laundry or clothing items and detergent. In general, wash and dry with the warmest temperatures recommended on the label.  Clean all areas the individual has used often Clean  all touchable surfaces, such as counters, tabletops, doorknobs, bathroom fixtures, toilets, phones, keyboards, tablets, and bedside tables, every day. Also, clean any surfaces that may have blood, body fluids, and/or secretions or excretions on them. Wear gloves when cleaning surfaces the patient has come in contact with. Use a diluted bleach solution (e.g., dilute bleach with 1 part bleach and 10 parts water) or a household disinfectant with a label that says EPA-registered for coronaviruses. To make a bleach solution at home, add 1 tablespoon of bleach to 1 quart (4 cups) of water. For a larger supply, add  cup of bleach to 1 gallon (16 cups) of water. Read labels of cleaning products and follow recommendations provided on product labels. Labels contain instructions for safe and effective use of the cleaning product including precautions you should take when applying the product, such as wearing gloves or eye protection and making sure you have good ventilation during use of the product. Remove gloves and wash hands immediately after cleaning.  Monitor yourself for signs and symptoms of illness Caregivers and household members are considered close contacts, should monitor their health, and will be asked to limit movement outside of the home to the extent possible. Follow the monitoring steps for close contacts listed on the symptom monitoring form.   ? If you have additional questions, contact your local health department or call the epidemiologist on call at [phone] (available 24/7). ? This guidance is subject to change. For the most up-to-date guidance from [facility], please refer to their website: YouBlogs.pl

## 2019-10-18 NOTE — ED Triage Notes (Signed)
Patient stated she has had a cough x2 weeks. She gets coughing spells where she looses her breath and finds it hard to catch. She denies fever, endorses diarrhea and general malaise. Has not been COVID tested recently. Says she smokes 1/2 ppd and has COPD.

## 2019-12-27 ENCOUNTER — Encounter (HOSPITAL_COMMUNITY): Payer: Self-pay | Admitting: Psychiatry

## 2019-12-27 ENCOUNTER — Other Ambulatory Visit: Payer: Self-pay

## 2019-12-27 ENCOUNTER — Ambulatory Visit (INDEPENDENT_AMBULATORY_CARE_PROVIDER_SITE_OTHER): Payer: Self-pay | Admitting: Psychiatry

## 2019-12-27 DIAGNOSIS — F410 Panic disorder [episodic paroxysmal anxiety] without agoraphobia: Secondary | ICD-10-CM

## 2019-12-27 DIAGNOSIS — F419 Anxiety disorder, unspecified: Secondary | ICD-10-CM

## 2019-12-27 DIAGNOSIS — F331 Major depressive disorder, recurrent, moderate: Secondary | ICD-10-CM

## 2019-12-27 MED ORDER — CLONAZEPAM 0.5 MG PO TABS: ORAL_TABLET | ORAL | 1 refills | Status: DC

## 2019-12-27 MED ORDER — SERTRALINE HCL 100 MG PO TABS: 150.0000 mg | ORAL_TABLET | Freq: Every day | ORAL | 2 refills | Status: DC

## 2019-12-27 NOTE — Progress Notes (Signed)
Virtual Visit via Telephone Note  I connected with Vicki Mcmillan on 12/27/19 at  2:00 PM EST by telephone and verified that I am speaking with the correct person using two identifiers.   I discussed the limitations, risks, security and privacy concerns of performing an evaluation and management service by telephone and the availability of in person appointments. I also discussed with the patient that there may be a patient responsible charge related to this service. The patient expressed understanding and agreed to proceed.   History of Present Illness: Patient was evaluated by phone session.  She is doing well on her medication.  She still have fewer panic attack but they are not as bad or intense.  Since taking the full O Klonopin 0.5 mg she is sleeping better and her anxiety is not as bad.  She is waiting for her disability.  She had filed the paperwork for her ago but so far she has not heard about court hearing.  She lives with her mother.  She is separated from her husband.  Recently she is having right shoulder pain and getting medication from her physician.  She likes Zoloft which is working very well.  She denies any feeling of hopelessness, irritable, crying spells or any suicidal thoughts.  Her energy level is good.  She has no tremors, shakes or any EPS.  She has not seen therapist due to Covid but hoping to resume once things get better.  She wants to keep her current medication.   Past Psychiatric History:No history of suicidal attempt or any psychiatric inpatient treatment. Started seeing psychiatrist after Chantix caused increased anxiety, hallucination and memory impairment. Saw Dr. Jessy Oto at [facility] prescribed Zoloft Abilify with good response. PCP prescribed Xanax.    Psychiatric Specialty Exam: Physical Exam  Review of Systems  There were no vitals taken for this visit.There is no height or weight on file to calculate BMI.  General Appearance: NA  Eye Contact:  NA   Speech:  Clear and Coherent and Normal Rate  Volume:  Normal  Mood:  Anxious  Affect:  NA  Thought Process:  Goal Directed  Orientation:  Full (Time, Place, and Person)  Thought Content:  Logical  Suicidal Thoughts:  No  Homicidal Thoughts:  No  Memory:  Immediate;   Good Recent;   Good Remote;   Good  Judgement:  Good  Insight:  Good  Psychomotor Activity:  NA  Concentration:  Concentration: Fair and Attention Span: Fair  Recall:  Good  Fund of Knowledge:  Good  Language:  Good  Akathisia:  No  Handed:  Right  AIMS (if indicated):     Assets:  Communication Skills Desire for Improvement Housing Resilience  ADL's:  Intact  Cognition:  WNL  Sleep:   good      Assessment and Plan: Panic attacks.  Major depressive disorder, recurrent.  Anxiety.  Patient doing well on her current medication.  Since taking Klonopin 0.5 mg whole tablet her panic attack and anxiety is under control.  Discussed medication side effects and benefits.  Continue Klonopin 0.5 mg at bedtime and Zoloft 150 mg daily.  Discussed medication side effects and benefits.  Recommended to call us back if she has any question or any concern.  Follow-up in 3 months.  Follow Up Instructions:    I discussed the assessment and treatment plan with the patient. The patient was provided an opportunity to ask questions and all were answered. The patient agreed with the plan and  demonstrated an understanding of the instructions.   The patient was advised to call back or seek an in-person evaluation if the symptoms worsen or if the condition fails to improve as anticipated.  I provided 20 minutes of non-face-to-face time during this encounter.   [name], MD

## 2020-02-23 ENCOUNTER — Other Ambulatory Visit (HOSPITAL_COMMUNITY): Payer: Self-pay | Admitting: Psychiatry

## 2020-02-23 DIAGNOSIS — F410 Panic disorder [episodic paroxysmal anxiety] without agoraphobia: Secondary | ICD-10-CM

## 2020-02-23 DIAGNOSIS — F419 Anxiety disorder, unspecified: Secondary | ICD-10-CM

## 2020-02-27 ENCOUNTER — Telehealth (HOSPITAL_COMMUNITY): Payer: Self-pay

## 2020-02-27 DIAGNOSIS — F410 Panic disorder [episodic paroxysmal anxiety] without agoraphobia: Secondary | ICD-10-CM

## 2020-02-27 DIAGNOSIS — F419 Anxiety disorder, unspecified: Secondary | ICD-10-CM

## 2020-02-27 MED ORDER — CLONAZEPAM 0.5 MG PO TABS: ORAL_TABLET | ORAL | 0 refills | Status: DC

## 2020-02-27 NOTE — Telephone Encounter (Signed)
Pt requesting refill of clonazepam tablets. Next appointment 5/24. If appropriate, please send to [facility] in [place].

## 2020-02-27 NOTE — Telephone Encounter (Signed)
Done

## 2020-03-26 ENCOUNTER — Telehealth (INDEPENDENT_AMBULATORY_CARE_PROVIDER_SITE_OTHER): Payer: Self-pay | Admitting: Psychiatry

## 2020-03-26 ENCOUNTER — Other Ambulatory Visit: Payer: Self-pay

## 2020-03-26 ENCOUNTER — Encounter (HOSPITAL_COMMUNITY): Payer: Self-pay | Admitting: Psychiatry

## 2020-03-26 DIAGNOSIS — F331 Major depressive disorder, recurrent, moderate: Secondary | ICD-10-CM

## 2020-03-26 DIAGNOSIS — F419 Anxiety disorder, unspecified: Secondary | ICD-10-CM

## 2020-03-26 MED ORDER — SERTRALINE HCL 100 MG PO TABS: 150.0000 mg | ORAL_TABLET | Freq: Every day | ORAL | 2 refills | Status: DC

## 2020-03-26 MED ORDER — CLONAZEPAM 0.5 MG PO TABS: ORAL_TABLET | ORAL | 0 refills | Status: DC

## 2020-03-26 NOTE — Progress Notes (Signed)
Virtual Visit via Telephone Note  I connected with Vicki Mcmillan on 03/26/20 at  2:00 PM EDT by telephone and verified that I am speaking with the correct person using two identifiers.   I discussed the limitations, risks, security and privacy concerns of performing an evaluation and management service by telephone and the availability of in person appointments. I also discussed with the patient that there may be a patient responsible charge related to this service. The patient expressed understanding and agreed to proceed.  Patient Location: Home Provider Location: Home Office  History of Present Illness: Patient is evaluated by phone session. She is anxious and nervous because she has not heard from disability appointment. She endorsed financial issues and sometimes she has difficulty affording the medication. She lives with her mother. She is still separated from her husband. She sleeps okay but sometimes she feels sad depressed but denies any suicidal thoughts. She is taking Klonopin is helping her anxiety. She still have shoulder pain and her physician recommended injection but she cannot afford at this time. She admitted that she may require MRI and hoping once she has disability approved and she can think about testing. She is taking Zoloft and Klonopin. She does not want to change medication. She denies any paranoia, hallucination or any suicidal thoughts.  Past Psychiatric History:No history of suicidal attempt or any psychiatric inpatient treatment. Started seeing psychiatrist after Chantix caused increased anxiety, hallucination and memory impairment. Saw Dr. Jessy Oto at [facility] prescribed Zoloft Abilify with good response. PCP prescribed Xanax.   Psychiatric Specialty Exam: Physical Exam  Review of Systems  There were no vitals taken for this visit.There is no height or weight on file to calculate BMI.  General Appearance: NA  Eye Contact:  NA  Speech:  Clear and Coherent   Volume:  Normal  Mood:  Anxious  Affect:  NA  Thought Process:  Goal Directed  Orientation:  Full (Time, Place, and Person)  Thought Content:  Rumination  Suicidal Thoughts:  No  Homicidal Thoughts:  No  Memory:  Immediate;   Fair Recent;   Fair Remote;   Good  Judgement:  Intact  Insight:  Present  Psychomotor Activity:  NA  Concentration:  Concentration: Fair and Attention Span: Fair  Recall:  [employer] of Knowledge:  Good  Language:  Good  Akathisia:  No  Handed:  Right  AIMS (if indicated):     Assets:  Communication Skills Desire for Improvement Housing Resilience Social Support  ADL's:  Intact  Cognition:  WNL  Sleep:   fair      Assessment and Plan: Major Depressive disorder, recurrent. Anxiety.  Patient is waiting for her disability. She endorsed financial issues but she feels her current medicine someone helping. He does not want to change or increase the dose. Continue Klonopin 0.5 mg to take as needed to help with anxiety and nervousness. Continue Zoloft 150 mg daily. Recommended to call us back if she is any question or any concern. At this time patient does not have financial resources for therapy. Follow-up in 3 months.    Follow Up Instructions:    I discussed the assessment and treatment plan with the patient. The patient was provided an opportunity to ask questions and all were answered. The patient agreed with the plan and demonstrated an understanding of the instructions.   The patient was advised to call back or seek an in-person evaluation if the symptoms worsen or if the condition fails to improve as  anticipated.  I provided 15 minutes of non-face-to-face time during this encounter.   Vicki Nations, MD

## 2020-05-28 ENCOUNTER — Other Ambulatory Visit (HOSPITAL_COMMUNITY): Payer: Self-pay | Admitting: Psychiatry

## 2020-05-28 DIAGNOSIS — F419 Anxiety disorder, unspecified: Secondary | ICD-10-CM

## 2020-06-04 ENCOUNTER — Other Ambulatory Visit (HOSPITAL_COMMUNITY): Payer: Self-pay | Admitting: *Deleted

## 2020-06-04 DIAGNOSIS — F419 Anxiety disorder, unspecified: Secondary | ICD-10-CM

## 2020-06-04 MED ORDER — CLONAZEPAM 0.5 MG PO TABS: ORAL_TABLET | ORAL | 0 refills | Status: DC

## 2020-06-26 ENCOUNTER — Encounter (HOSPITAL_COMMUNITY): Payer: Self-pay | Admitting: Psychiatry

## 2020-06-26 ENCOUNTER — Telehealth (INDEPENDENT_AMBULATORY_CARE_PROVIDER_SITE_OTHER): Payer: Self-pay | Admitting: Psychiatry

## 2020-06-26 ENCOUNTER — Telehealth (HOSPITAL_COMMUNITY): Payer: Self-pay | Admitting: Psychiatry

## 2020-06-26 ENCOUNTER — Other Ambulatory Visit: Payer: Self-pay

## 2020-06-26 DIAGNOSIS — F419 Anxiety disorder, unspecified: Secondary | ICD-10-CM

## 2020-06-26 DIAGNOSIS — F331 Major depressive disorder, recurrent, moderate: Secondary | ICD-10-CM

## 2020-06-26 MED ORDER — SERTRALINE HCL 100 MG PO TABS: 150.0000 mg | ORAL_TABLET | Freq: Every day | ORAL | 2 refills | Status: DC

## 2020-06-26 MED ORDER — CLONAZEPAM 0.5 MG PO TABS: ORAL_TABLET | ORAL | 2 refills | Status: DC

## 2020-06-26 NOTE — Progress Notes (Signed)
Virtual Visit via Telephone Note  I connected with Vicki Mcmillan on 06/26/20 at  2:00 PM EDT by telephone and verified that I am speaking with the correct person using two identifiers.  Location: Patient: home Provider: home office   I discussed the limitations, risks, security and privacy concerns of performing an evaluation and management service by telephone and the availability of in person appointments. I also discussed with the patient that there may be a patient responsible charge related to this service. The patient expressed understanding and agreed to proceed.   History of Present Illness: Patient was evaluated by phone session.  She is anxious because her disability date is now scheduled for September 10.  She is hoping it go through okay.  She is taking Zoloft 150 mg and Klonopin.  Now she takes Klonopin full dose that helps most of the time her anxiety and sleep.  She has chronic shoulder pain, memory impairment and COPD.  Due to lack of insurance she had struggle going to see specialist and for that reason did not get MRI.  She is hoping her disability approved so she can apply for insurance.  She lives with her mother.  She has very little contact with her husband who is separated for a while.  Patient has no tremors, shakes or any EPS.  She denies any paranoia, hallucination, suicidal thoughts.  Her energy level is okay.  Recently her PCP started her on baclofen.  She realized that she need to see specialist for her COPD and chronic pain but due to lack of insurance unable to see specialist.  Patient like to keep her Zoloft and Klonopin.     Past Psychiatric History: No h/o suicidal attempt or inpatient treatment. Saw psychiatrist after Chantix caused increased anxiety, hallucination and memory impairment. Saw Dr. Jessy Oto at [facility] given Zoloft Abilify with good response. PCP prescribed Xanax.   Psychiatric Specialty Exam: Physical Exam  Review of Systems  Weight 115 lb  (52.2 kg).There is no height or weight on file to calculate BMI.  General Appearance: NA  Eye Contact:  NA  Speech:  Clear and Coherent  Volume:  Normal  Mood:  Anxious  Affect:  NA  Thought Process:  Descriptions of Associations: Intact  Orientation:  Full (Time, Place, and Person)  Thought Content:  Rumination  Suicidal Thoughts:  No  Homicidal Thoughts:  No  Memory:  Immediate;   Fair Recent;   Fair Remote;   Good  Judgement:  Intact  Insight:  Present  Psychomotor Activity:  NA  Concentration:  Concentration: Fair and Attention Span: Fair  Recall:  [employer] of Knowledge:  Good  Language:  Good  Akathisia:  No  Handed:  Right  AIMS (if indicated):     Assets:  Communication Skills Desire for Improvement Housing Social Support  ADL's:  Intact  Cognition:  WNL  Sleep:   fair      Assessment and Plan: Major depressive disorder, recurrent.  Anxiety.  Reassurance given about her upcoming disability appointment.  Patient does not want to change medication.  Recommended to continue Klonopin 0.5 mg to take as needed for anxiety and nervousness.  Continue Zoloft 150 mg daily.  Recommended to call us back if she has any question or any concern.  Follow-up in 3 months.  Follow Up Instructions:    I discussed the assessment and treatment plan with the patient. The patient was provided an opportunity to ask questions and all were answered. The patient  agreed with the plan and demonstrated an understanding of the instructions.   The patient was advised to call back or seek an in-person evaluation if the symptoms worsen or if the condition fails to improve as anticipated.  I provided 15 minutes of non-face-to-face time during this encounter.   [name], MD

## 2020-07-24 ENCOUNTER — Other Ambulatory Visit (HOSPITAL_COMMUNITY): Payer: Self-pay | Admitting: *Deleted

## 2020-07-24 DIAGNOSIS — F419 Anxiety disorder, unspecified: Secondary | ICD-10-CM

## 2020-07-24 DIAGNOSIS — F331 Major depressive disorder, recurrent, moderate: Secondary | ICD-10-CM

## 2020-07-24 MED ORDER — SERTRALINE HCL 100 MG PO TABS: 150.0000 mg | ORAL_TABLET | Freq: Every day | ORAL | 1 refills | Status: DC

## 2020-09-11 ENCOUNTER — Other Ambulatory Visit (HOSPITAL_COMMUNITY): Payer: Self-pay | Admitting: Psychiatry

## 2020-09-11 DIAGNOSIS — F419 Anxiety disorder, unspecified: Secondary | ICD-10-CM

## 2020-09-11 DIAGNOSIS — F331 Major depressive disorder, recurrent, moderate: Secondary | ICD-10-CM

## 2020-09-24 ENCOUNTER — Telehealth (INDEPENDENT_AMBULATORY_CARE_PROVIDER_SITE_OTHER): Payer: Medicare Other | Admitting: Psychiatry

## 2020-09-24 ENCOUNTER — Other Ambulatory Visit: Payer: Self-pay

## 2020-09-24 ENCOUNTER — Encounter (HOSPITAL_COMMUNITY): Payer: Self-pay | Admitting: Psychiatry

## 2020-09-24 VITALS — Wt 115.0 lb

## 2020-09-24 DIAGNOSIS — F331 Major depressive disorder, recurrent, moderate: Secondary | ICD-10-CM | POA: Diagnosis not present

## 2020-09-24 DIAGNOSIS — F419 Anxiety disorder, unspecified: Secondary | ICD-10-CM

## 2020-09-24 MED ORDER — SERTRALINE HCL 100 MG PO TABS: 150.0000 mg | ORAL_TABLET | Freq: Every day | ORAL | 2 refills | Status: DC

## 2020-09-24 MED ORDER — CLONAZEPAM 0.5 MG PO TABS: ORAL_TABLET | ORAL | 2 refills | Status: DC

## 2020-09-24 NOTE — Progress Notes (Signed)
Virtual Visit via Telephone Note  I connected with Vicki Mcmillan on 09/24/20 at  2:00 PM EST by telephone and verified that I am speaking with the correct person using two identifiers.  Location: Patient: home Provider: home office   I discussed the limitations, risks, security and privacy concerns of performing an evaluation and management service by telephone and the availability of in person appointments. I also discussed with the patient that there may be a patient responsible charge related to this service. The patient expressed understanding and agreed to proceed.   History of Present Illness: Is evaluated by phone session.  She is relieved because her disability is now approved and she received her first check.  She has been trying to schedule appointment with a specialist.  She has appointment with eye doctor, pulmonologist and then she will schedule with ENT.  She still have back pain and memory issues.  She has difficulty remembering short-term things but she feels her other health conditions need to be addressed first.  She lives with her mother.  Lately she noticed her sleep is not as good and despite the Klonopin 0.5 mg she sometimes struggle with insomnia.  She denies any panic attack or any crying spells.  She feels her depression is a stable on the Zoloft.  She has no tremors shakes or any EPS.  She denies any feeling of hopelessness.  Her appetite is okay and weight is stable.  She has COPD and chronic pain.  She denies drinking or using any illegal substances.   Past Psychiatric History: No h/o suicidal attempt or inpatient treatment. Saw psychiatrist after Chantix caused increased anxiety, hallucination and memory impairment. Saw Dr. Jessy Oto at [facility] given Zoloft Abilify with good response. PCP prescribed Xanax.   Psychiatric Specialty Exam: Physical Exam  Review of Systems  Weight 115 lb (52.2 kg).There is no height or weight on file to calculate BMI.  General  Appearance: NA  Eye Contact:  NA  Speech:  Normal Rate  Volume:  Normal  Mood:  Euthymic  Affect:  NA  Thought Process:  Goal Directed  Orientation:  Full (Time, Place, and Person)  Thought Content:  WDL  Suicidal Thoughts:  No  Homicidal Thoughts:  No  Memory:  Immediate;   Good Recent;   Fair Remote;   Fair  Judgement:  Intact  Insight:  Present  Psychomotor Activity:  NA  Concentration:  Concentration: Fair and Attention Span: Fair  Recall:  [employer] of Knowledge:  Good  Language:  Good  Akathisia:  No  Handed:  Right  AIMS (if indicated):     Assets:  Communication Skills Desire for Improvement Housing Resilience  ADL's:  Intact  Cognition:  Impaired,  Mild  Sleep:   fair      Assessment and Plan: Major depressive disorder, recurrent.  Anxiety.  Patient is relieved that her disability is approved.  Now she is in the process of scheduling an appointment with pulmonologist, ENT, orthopedic for back pain.  She was advised to do MRI and she was waiting for disability to get approved.  Now she has insurance she is hoping that she can take care of her other health needs.  She does not want to change the medication.  We talked about trying melatonin to help her insomnia.  She agreed with the plan.  Discussed to try melatonin 3 to 5 mg.  Continue Zoloft 150 mg daily and Klonopin 0.5 mg at bedtime.  Recommended to call us  back if she has any question or any concern.  Follow-up in 3 months.  Follow Up Instructions:    I discussed the assessment and treatment plan with the patient. The patient was provided an opportunity to ask questions and all were answered. The patient agreed with the plan and demonstrated an understanding of the instructions.   The patient was advised to call back or seek an in-person evaluation if the symptoms worsen or if the condition fails to improve as anticipated.  I provided 13 minutes of non-face-to-face time during this encounter.   [name], MD

## 2020-10-12 ENCOUNTER — Ambulatory Visit (INDEPENDENT_AMBULATORY_CARE_PROVIDER_SITE_OTHER): Payer: Medicare Other

## 2020-10-12 ENCOUNTER — Ambulatory Visit: Payer: Medicare Other | Admitting: Pulmonary Disease

## 2020-10-12 ENCOUNTER — Encounter: Payer: Self-pay | Admitting: Pulmonary Disease

## 2020-10-12 ENCOUNTER — Other Ambulatory Visit: Payer: Self-pay

## 2020-10-12 VITALS — BP 108/72 | HR 86 | Temp 97.4°F | Ht 61.0 in | Wt 117.2 lb

## 2020-10-12 DIAGNOSIS — J449 Chronic obstructive pulmonary disease, unspecified: Secondary | ICD-10-CM

## 2020-10-12 LAB — CBC WITH DIFFERENTIAL/PLATELET
Basophils Absolute: 0 10*3/uL (ref 0.0–0.1)
Basophils Relative: 0.8 % (ref 0.0–3.0)
Eosinophils Absolute: 0.2 10*3/uL (ref 0.0–0.7)
Eosinophils Relative: 2.6 % (ref 0.0–5.0)
HCT: 43.3 % (ref 36.0–46.0)
Hemoglobin: 14.6 g/dL (ref 12.0–15.0)
Lymphocytes Relative: 34.8 % (ref 12.0–46.0)
Lymphs Abs: 2.2 10*3/uL (ref 0.7–4.0)
MCHC: 33.7 g/dL (ref 30.0–36.0)
MCV: 93 fl (ref 78.0–100.0)
Monocytes Absolute: 0.5 10*3/uL (ref 0.1–1.0)
Monocytes Relative: 7.5 % (ref 3.0–12.0)
Neutro Abs: 3.4 10*3/uL (ref 1.4–7.7)
Neutrophils Relative %: 54.3 % (ref 43.0–77.0)
Platelets: 231 10*3/uL (ref 150.0–400.0)
RBC: 4.66 Mil/uL (ref 3.87–5.11)
RDW: 13.3 % (ref 11.5–15.5)
WBC: 6.3 10*3/uL (ref 4.0–10.5)

## 2020-10-12 MED ORDER — NICOTINE 21 MG/24HR TD PT24: 21.0000 mg | MEDICATED_PATCH | Freq: Every day | TRANSDERMAL | 0 refills | Status: DC

## 2020-10-12 MED ORDER — TRELEGY ELLIPTA 200-62.5-25 MCG/INH IN AEPB: 1.0000 | INHALATION_SPRAY | Freq: Every day | RESPIRATORY_TRACT | 0 refills | Status: DC

## 2020-10-12 NOTE — Patient Instructions (Signed)
Recheck CBC differential, IgE, alpha-1 antitrypsin levels and phenotype Chest x-ray today, schedule pulmonary function test We will give you samples of Trelegy 200 inhaler and a prescription Please work on quitting smoking.  Will call in nicotine patches 21 mg.  Follow-up in 3 months.

## 2020-10-12 NOTE — Progress Notes (Signed)
Vicki Mcmillan    [id number]    [birth year]  Primary Care Physician:White, Delmar Landau, NP  Referring Physician: [name], NP [place] [address]  [place] [address]  Chief complaint: Consult for COPD  HPI: 49 year old active smoker with history of COPD, anxiety, depression Told that she had mild COPD in 2017.  No PFTs on record Complains of increasing dyspnea on exertion with cough, congestion, wheezing.  She is on albuterol inhaler which she is using several times a day with some relief Recently seen by primary care and given a triple injection.  She was also prescribed trelegy but cannot afford the co-pay.  She plans on getting better inhaler with new insurance starting January.  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  No mold, hot tub, Jacuzzi Smoking history: 12.5-pack-year smoker.  Continues to smoke half pack per day Travel history: No significant family history Relevant family history: Aunt had asthma, COPD.  She was a smoker.  Outpatient Encounter Medications as of 10/12/2020  Medication Sig  . albuterol (PROVENTIL HFA;VENTOLIN HFA) 108 (90 Base) MCG/ACT inhaler Inhale 1-2 puffs into the lungs every 6 (six) hours as needed for wheezing or shortness of breath.  . baclofen (LIORESAL) 10 MG tablet TAKE 1 TABLET(10 MG) BY MOUTH DAILY  . clonazePAM (KLONOPIN) 0.5 MG tablet Take one tab as needed for panic attack  . ipratropium-albuterol (DUONEB) 0.5-2.5 (3) MG/3ML SOLN Take 3 mLs by nebulization 2 (two) times daily.  . sertraline (ZOLOFT) 100 MG tablet Take 1.5 tablets (150 mg total) by mouth daily.  . fluticasone (FLONASE) 50 MCG/ACT nasal spray Place 2 sprays into both nostrils daily. (Patient not taking: Reported on 10/12/2020)   No facility-administered encounter medications on file as of 10/12/2020.    Allergies as of 10/12/2020 - Review Complete 10/12/2020  Allergen Reaction Noted  .  Hydrocodone-acetaminophen Nausea And Vomiting 05/15/2015  . Chantix [varenicline]  05/11/2016  . Oxycodone Nausea And Vomiting and Other (See Comments) 07/27/2017  . Wellbutrin [bupropion]  05/11/2016    Past Medical History:  Diagnosis Date  . Anxiety   . COPD (chronic obstructive pulmonary disease) ([place])   . Depression   . Panic attacks     Past Surgical History:  Procedure Laterality Date  . TUBAL LIGATION    . WRIST SURGERY      Family History  Problem Relation Age of Onset  . COPD Sister        Died in her 44s from it.  Marland Kitchen COPD Maternal Aunt   . COPD Father     Social History   Socioeconomic History  . Marital status: Legally Separated    Spouse name: Not on file  . Number of children: 1  . Years of education: Not on file  . Highest education level: Not on file  Occupational History  . Not on file  Tobacco Use  . Smoking status: Current Every Day Smoker    Packs/day: 0.50    Types: Cigarettes  . Smokeless tobacco: Never Used  . Tobacco comment: 10 cigaretts smoked a day 10/12/20 [facility]   Vaping Use  . Vaping Use: Never used  Substance and Sexual Activity  . Alcohol use: No  . Drug use: No  . Sexual activity: Yes    Birth control/protection: None  Other Topics Concern  . Not on file  Social History Narrative  . Not on file  Social Determinants of Health   Financial Resource Strain: Not on file  Food Insecurity: Not on file  Transportation Needs: Not on file  Physical Activity: Not on file  Stress: Not on file  Social Connections: Not on file  Intimate Partner Violence: Not on file    Review of systems: Review of Systems  Constitutional: Negative for fever and chills.  HENT: Negative.   Eyes: Negative for blurred vision.  Respiratory: as per HPI  Cardiovascular: Negative for chest pain and palpitations.  Gastrointestinal: Negative for vomiting, diarrhea, blood per rectum. Genitourinary: Negative for dysuria, urgency, frequency and hematuria.   Musculoskeletal: Negative for myalgias, back pain and joint pain.  Skin: Negative for itching and rash.  Neurological: Negative for dizziness, tremors, focal weakness, seizures and loss of consciousness.  Endo/Heme/Allergies: Negative for environmental allergies.  Psychiatric/Behavioral: Negative for depression, suicidal ideas and hallucinations.  All other systems reviewed and are negative.  Physical Exam: Blood pressure 108/72, pulse 86, temperature (!) 97.4 F (36.3 C), height 5\' 1"  (1.549 m), weight 117 lb 3.2 oz (53.2 kg), SpO2 95 %. Gen:      No acute distress HEENT:  EOMI, sclera anicteric Neck:     No masses; no thyromegaly Lungs:    Clear to auscultation bilaterally; normal respiratory effort CV:         Regular rate and rhythm; no murmurs Abd:      + bowel sounds; soft, non-tender; no palpable masses, no distension Ext:    No edema; adequate peripheral perfusion Skin:      Warm and dry; no rash Neuro: alert and oriented x 3 Psych: normal mood and affect  Data Reviewed: Imaging: Chest x-ray 12/50/20-no acute cardiopulmonary abnormality.  I have reviewed the images personally.  PFTs:  Labs: CBC 07/13/2019-WBC 5.1, eos 5%, absolute eosinophil count 255  Assessment:  Assessment for COPD Schedule chest x-ray today, PFTs CBC differential, IgE and alpha-1 antitrypsin levels and phenotype for baseline assessment We will give her samples of Trelegy inhaler.  She will get it through her insurance starting January.  Active smoker Has not tolerated Chantix or Wellbutrin in the past due to hallucinations. Prescribe nicotine patches.  Use this with over-the-counter nicotine gum Time spent counseling-5 minutes.  Reassess at return visit.  Plan/Recommendations: CBC, IgE, alpha-1 antitrypsin Chest x-ray, PFTs Trelegy Smoking cessation with nicotine patches and gum  Marshell Garfinkel MD [facility] 10/12/2020, 11:31 AM  CC: [name], NP  n

## 2020-10-12 NOTE — Addendum Note (Signed)
Addended by: Gavin Potters R on: 10/12/2020 12:00 PM   Modules accepted: Orders

## 2020-10-31 LAB — ALPHA-1 ANTITRYPSIN PHENOTYPE: A-1 Antitrypsin, Ser: 150 mg/dL (ref 83–199)

## 2020-10-31 LAB — IGE: IgE (Immunoglobulin E), Serum: 19 kU/L (ref <=114)

## 2020-11-07 ENCOUNTER — Telehealth: Payer: Self-pay | Admitting: Pulmonary Disease

## 2020-11-07 MED ORDER — TRELEGY ELLIPTA 200-62.5-25 MCG/INH IN AEPB: 1.0000 | INHALATION_SPRAY | Freq: Every day | RESPIRATORY_TRACT | 5 refills | Status: DC

## 2020-11-07 NOTE — Telephone Encounter (Signed)
Refill sent in per request  Sent to Dr. Isaiah Serge for FYI , nothing further needed.

## 2020-12-10 ENCOUNTER — Other Ambulatory Visit (HOSPITAL_COMMUNITY): Payer: Self-pay | Admitting: Psychiatry

## 2020-12-10 DIAGNOSIS — F419 Anxiety disorder, unspecified: Secondary | ICD-10-CM

## 2020-12-10 DIAGNOSIS — F331 Major depressive disorder, recurrent, moderate: Secondary | ICD-10-CM

## 2020-12-13 ENCOUNTER — Telehealth (HOSPITAL_COMMUNITY): Payer: Self-pay | Admitting: *Deleted

## 2020-12-13 NOTE — Telephone Encounter (Signed)
Opened in error

## 2020-12-18 ENCOUNTER — Other Ambulatory Visit: Payer: Self-pay

## 2020-12-18 ENCOUNTER — Encounter (HOSPITAL_COMMUNITY): Payer: Self-pay | Admitting: Psychiatry

## 2020-12-18 ENCOUNTER — Telehealth (INDEPENDENT_AMBULATORY_CARE_PROVIDER_SITE_OTHER): Payer: Medicare HMO | Admitting: Psychiatry

## 2020-12-18 DIAGNOSIS — F419 Anxiety disorder, unspecified: Secondary | ICD-10-CM | POA: Diagnosis not present

## 2020-12-18 DIAGNOSIS — F331 Major depressive disorder, recurrent, moderate: Secondary | ICD-10-CM

## 2020-12-18 MED ORDER — SERTRALINE HCL 100 MG PO TABS: 150.0000 mg | ORAL_TABLET | Freq: Every day | ORAL | 2 refills | Status: DC

## 2020-12-18 MED ORDER — CLONAZEPAM 0.5 MG PO TABS
ORAL_TABLET | ORAL | 1 refills | Status: DC
Start: 2020-12-18 — End: 2021-03-14

## 2020-12-18 NOTE — Progress Notes (Signed)
Virtual Visit via Telephone Note  I connected with Vicki Mcmillan on 12/18/20 at  2:00 PM EST by telephone and verified that I am speaking with the correct person using two identifiers.  Location: Patient: Home Provider: Home Office   I discussed the limitations, risks, security and privacy concerns of performing an evaluation and management service by telephone and the availability of in person appointments. I also discussed with the patient that there may be a patient responsible charge related to this service. The patient expressed understanding and agreed to proceed.   History of Present Illness: Patient is evaluated by phone session.  She has been doing well and taking her medication.  She rarely takes Klonopin and it helps the anxiety.  Since she got disability approved she has been busy taking care of her health needs and seeing physicians.  Recently she saw pulmonologist and now she is hoping to schedule with ear nose and throat specialist.  She has blood work done and her CBC is normal.  She does not want to change the medication.  She lives with her mother.  She denies any crying spells or any feeling of hopelessness or worthlessness.  Her sleep is good.  She wants to keep the Zoloft which is helping her depression.  Her appetite is okay.   Past Psychiatric History: No h/osuicidal attempt or inpatient treatment. Sawpsychiatrist after Chantix caused increased anxiety, hallucination and memory impairment. Saw Dr. Jessy Oto at [facility] Abilify with good response. PCP prescribed Xanax.  Recent Results (from the past 2160 hour(s))  Alpha-1 antitrypsin phenotype     Status: None   Collection Time: 10/12/20 11:56 AM  Result Value Ref Range   A-1 Antitrypsin, Ser 150 83 - 199 mg/dL   ALPHA-1-ANTITRYPSIN (AAT) PHENOTYPE SEE NOTE     Comment: THIS PATIENT'S ALPHA-1-ANTITRYPSIN PHENOTYPE IS PI*MM. Marland Kitchen 90% of normal individuals have the MM phenotype, with normal quantitative  AAT levels. Many phenotypic patterns have been described, including deficiency states with F, S, Z, or other alleles. As a general estimation, compared to M allele of 100% of normal A-1-Antitrypsin protein, the S allele produces approximately 60% and the Z allele 20%. For example, an MS phenotype would have about 80% of normal A-1-Antitrypsin protein level, a 50% contribution from the M allele and 30% from the S allele. A ZZ phenotype would have about 20% of normal levels, a 10% contribution from each Z gene. The F allele has normal A-1-Antitrypsin levels, but the kinetics of elastase inhibition is not as efficient as an M allele product; F alleles should be considered functionally mildly deficient. Other variants are identifiable by phenotypic analysis. These include CM, DP, EM, GM, IS, LM, M1M2, M3M3, MP, MT, XX, MY, and M1N. I, P, T and  null alleles are considered deleterious. C, D, E, G, L, M1, M2, M3, X and Y alleles are generally considered normal variants. The MZ-Pratt phenotype is a normal variant; care should be taken to avoid confusion with the deficient MZ phenotype.   IgE     Status: None   Collection Time: 10/12/20 11:56 AM  Result Value Ref Range   IgE (Immunoglobulin E), Serum 19 <OR=114 kU/L  CBC w/Diff     Status: None   Collection Time: 10/12/20 11:56 AM  Result Value Ref Range   WBC 6.3 4.0 - 10.5 K/uL   RBC 4.66 3.87 - 5.11 Mil/uL   Hemoglobin 14.6 12.0 - 15.0 g/dL   HCT 43.3 36.0 - 46.0 %   MCV 93.0  78.0 - 100.0 fl   MCHC 33.7 30.0 - 36.0 g/dL   RDW 13.3 11.5 - 15.5 %   Platelets 231.0 150.0 - 400.0 K/uL   Neutrophils Relative % 54.3 43.0 - 77.0 %   Lymphocytes Relative 34.8 12.0 - 46.0 %   Monocytes Relative 7.5 3.0 - 12.0 %   Eosinophils Relative 2.6 0.0 - 5.0 %   Basophils Relative 0.8 0.0 - 3.0 %   Neutro Abs 3.4 1.4 - 7.7 K/uL   Lymphs Abs 2.2 0.7 - 4.0 K/uL   Monocytes Absolute 0.5 0.1 - 1.0 K/uL   Eosinophils Absolute 0.2 0.0 - 0.7 K/uL    Basophils Absolute 0.0 0.0 - 0.1 K/uL     Psychiatric Specialty Exam: Physical Exam  Review of Systems  Weight 123 lb (55.8 kg).Body mass index is 23.24 kg/m.  General Appearance: NA  Eye Contact:  NA  Speech:  Normal Rate  Volume:  Normal  Mood:  Euthymic  Affect:  NA  Thought Process:  Goal Directed  Orientation:  Full (Time, Place, and Person)  Thought Content:  WDL  Suicidal Thoughts:  No  Homicidal Thoughts:  No  Memory:  Immediate;   Good Recent;   Good Remote;   Good  Judgement:  Intact  Insight:  Good  Psychomotor Activity:  NA  Concentration:  Concentration: Good and Attention Span: Good  Recall:  Good  Fund of Knowledge:  Good  Language:  Good  Akathisia:  No  Handed:  Right  AIMS (if indicated):     Assets:  Communication Skills Desire for Improvement Housing Resilience Social Support Transportation  ADL's:  Intact  Cognition:  WNL  Sleep:   ok      Assessment and Plan: Major depressive disorder, recurrent.  Anxiety.  Patient is a stable on her current medication.  She is busy taking care of her basic health needs.  Continue Zoloft and 50 mg daily and Klonopin 0.5 mg to take as needed for severe anxiety and panic attack.  Discussed medication side effects and benefits.  Recommended to call us back if is any question or any concern.  Follow-up in 3 months.  Follow Up Instructions:    I discussed the assessment and treatment plan with the patient. The patient was provided an opportunity to ask questions and all were answered. The patient agreed with the plan and demonstrated an understanding of the instructions.   The patient was advised to call back or seek an in-person evaluation if the symptoms worsen or if the condition fails to improve as anticipated.  I provided 17 minutes of non-face-to-face time during this encounter.   [name], MD

## 2021-01-08 ENCOUNTER — Inpatient Hospital Stay (HOSPITAL_COMMUNITY)
Admission: RE | Admit: 2021-01-08 | Discharge: 2021-01-08 | Disposition: A | Discharge status: Home / Self Care | Payer: Self-pay | Source: Ambulatory Visit

## 2021-01-08 NOTE — Progress Notes (Signed)
Patient presented to the [facility] drive-thru testing site today for pre-procedural testing. Patient stated she had a positive COVID test in January 2022. Care Everywhere shows a positive COVID test on 11/22/20 from [facility]. Patient not tested as she has had a positive COVID test within the past 90 days. Patient expressed understanding.

## 2021-01-11 ENCOUNTER — Encounter: Payer: Self-pay | Admitting: Pulmonary Disease

## 2021-01-11 ENCOUNTER — Ambulatory Visit (INDEPENDENT_AMBULATORY_CARE_PROVIDER_SITE_OTHER): Payer: Medicare HMO | Admitting: Pulmonary Disease

## 2021-01-11 ENCOUNTER — Other Ambulatory Visit: Payer: Self-pay

## 2021-01-11 ENCOUNTER — Ambulatory Visit: Payer: Medicare HMO | Admitting: Pulmonary Disease

## 2021-01-11 VITALS — BP 116/70 | HR 82 | Temp 97.7°F | Ht 60.0 in | Wt 124.0 lb

## 2021-01-11 DIAGNOSIS — J449 Chronic obstructive pulmonary disease, unspecified: Secondary | ICD-10-CM

## 2021-01-11 DIAGNOSIS — Z720 Tobacco use: Secondary | ICD-10-CM

## 2021-01-11 LAB — PULMONARY FUNCTION TEST
DL/VA % pred: 49 %
DL/VA: 2.17 ml/min/mmHg/L
DLCO cor % pred: 47 %
DLCO cor: 9.1 ml/min/mmHg
DLCO unc % pred: 47 %
DLCO unc: 9.1 ml/min/mmHg
FEF 25-75 Post: 0.75 L/sec
FEF 25-75 Pre: 0.32 L/sec
FEF2575-%Change-Post: 134 %
FEF2575-%Pred-Post: 28 %
FEF2575-%Pred-Pre: 12 %
FEV1-%Change-Post: 49 %
FEV1-%Pred-Post: 46 %
FEV1-%Pred-Pre: 30 %
FEV1-Post: 1.16 L
FEV1-Pre: 0.78 L
FEV1FVC-%Change-Post: 15 %
FEV1FVC-%Pred-Pre: 54 %
FEV6-%Change-Post: 29 %
FEV6-%Pred-Post: 72 %
FEV6-%Pred-Pre: 55 %
FEV6-Post: 2.25 L
FEV6-Pre: 1.73 L
FEV6FVC-%Change-Post: 0 %
FEV6FVC-%Pred-Post: 99 %
FEV6FVC-%Pred-Pre: 99 %
FVC-%Change-Post: 29 %
FVC-%Pred-Post: 72 %
FVC-%Pred-Pre: 56 %
FVC-Post: 2.31 L
FVC-Pre: 1.79 L
Post FEV1/FVC ratio: 50 %
Post FEV6/FVC ratio: 97 %
Pre FEV1/FVC ratio: 44 %
Pre FEV6/FVC Ratio: 97 %
RV % pred: 193 %
RV: 3.17 L
TLC % pred: 112 %
TLC: 5.17 L

## 2021-01-11 MED ORDER — ALBUTEROL SULFATE HFA 108 (90 BASE) MCG/ACT IN AERS: 1.0000 | INHALATION_SPRAY | Freq: Four times a day (QID) | RESPIRATORY_TRACT | 5 refills | Status: DC | PRN

## 2021-01-11 NOTE — Patient Instructions (Signed)
I have reviewed your PFTs which show severe COPD secondary to smoking You need to quit ASAP before the lung damage gets worse  Continue the nicotine patches Call [phone] quit now hotline Other [facility] smoking cessation helpline at [phone]  Continue Trelegy inhaler  We will refer you for low-dose screening CT of the chest for annual screening  Follow-up in 6 months

## 2021-01-11 NOTE — Progress Notes (Signed)
PFT done today. 

## 2021-01-11 NOTE — Progress Notes (Signed)
Vicki Mcmillan    [id number]    [birth year]  Primary Care Physician:White, Delmar Landau, NP  Referring Physician: [name], NP [facility] [address],  [place] [address]  Chief complaint: Consult for COPD  HPI: 50 year old active smoker with history of COPD, anxiety, depression Told that she had mild COPD in 2017.  No PFTs on record Complains of increasing dyspnea on exertion with cough, congestion, wheezing.  She is on albuterol inhaler which she is using several times a day with some relief Recently seen by primary care and given a triple injection.  She was also prescribed trelegy but cannot afford the co-pay.  She plans on getting better inhaler with new insurance starting January.  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  No mold, hot tub, Jacuzzi Smoking history: 12.5-pack-year smoker.  Continues to smoke half pack per day Travel history: No significant family history Relevant family history: Aunt had asthma, COPD.  She was a smoker.  Outpatient Encounter Medications as of 01/11/2021  Medication Sig  . albuterol (PROVENTIL HFA;VENTOLIN HFA) 108 (90 Base) MCG/ACT inhaler Inhale 1-2 puffs into the lungs every 6 (six) hours as needed for wheezing or shortness of breath.  . baclofen (LIORESAL) 10 MG tablet TAKE 1 TABLET(10 MG) BY MOUTH DAILY  . clonazePAM (KLONOPIN) 0.5 MG tablet Take one tab as needed for panic attack  . fluticasone (FLONASE) 50 MCG/ACT nasal spray Place 2 sprays into both nostrils daily.  . Fluticasone-Umeclidin-Vilant (TRELEGY ELLIPTA) 200-62.5-25 MCG/INH AEPB Inhale 1 puff into the lungs daily.  Marland Kitchen ipratropium-albuterol (DUONEB) 0.5-2.5 (3) MG/3ML SOLN Take 3 mLs by nebulization 2 (two) times daily.  . sertraline (ZOLOFT) 100 MG tablet Take 1.5 tablets (150 mg total) by mouth daily.  . [DISCONTINUED] nicotine (NICODERM CQ) 21 mg/24hr patch Place 1 patch (21 mg total) onto the skin  daily.   No facility-administered encounter medications on file as of 01/11/2021.    Allergies as of 01/11/2021 - Review Complete 01/11/2021  Allergen Reaction Noted  . Hydrocodone-acetaminophen Nausea And Vomiting 05/15/2015  . Chantix [varenicline]  05/11/2016  . Oxycodone Nausea And Vomiting and Other (See Comments) 07/27/2017  . Wellbutrin [bupropion]  05/11/2016    Past Medical History:  Diagnosis Date  . Anxiety   . COPD (chronic obstructive pulmonary disease) ([place])   . Depression   . Panic attacks     Past Surgical History:  Procedure Laterality Date  . TUBAL LIGATION    . WRIST SURGERY      Family History  Problem Relation Age of Onset  . COPD Sister        Died in her 28s from it.  Marland Kitchen COPD Maternal Aunt   . COPD Father     Social History   Socioeconomic History  . Marital status: Legally Separated    Spouse name: Not on file  . Number of children: 1  . Years of education: Not on file  . Highest education level: Not on file  Occupational History  . Not on file  Tobacco Use  . Smoking status: Current Every Day Smoker    Packs/day: 0.50    Types: Cigarettes  . Smokeless tobacco: Never Used  . Tobacco comment: 4-5 cigarettes/day 01/11/21-lt  Vaping Use  . Vaping Use: Never used  Substance and Sexual Activity  . Alcohol use: No  . Drug use: No  . Sexual activity: Yes  Birth control/protection: None  Other Topics Concern  . Not on file  Social History Narrative  . Not on file   Social Determinants of Health   Financial Resource Strain: Not on file  Food Insecurity: Not on file  Transportation Needs: Not on file  Physical Activity: Not on file  Stress: Not on file  Social Connections: Not on file  Intimate Partner Violence: Not on file    Review of systems: Review of Systems  Constitutional: Negative for fever and chills.  HENT: Negative.   Eyes: Negative for blurred vision.  Respiratory: as per HPI  Cardiovascular: Negative for chest  pain and palpitations.  Gastrointestinal: Negative for vomiting, diarrhea, blood per rectum. Genitourinary: Negative for dysuria, urgency, frequency and hematuria.  Musculoskeletal: Negative for myalgias, back pain and joint pain.  Skin: Negative for itching and rash.  Neurological: Negative for dizziness, tremors, focal weakness, seizures and loss of consciousness.  Endo/Heme/Allergies: Negative for environmental allergies.  Psychiatric/Behavioral: Negative for depression, suicidal ideas and hallucinations.  All other systems reviewed and are negative.  Physical Exam: Blood pressure 108/72, pulse 86, temperature (!) 97.4 F (36.3 C), height 5\' 1"  (1.549 m), weight 117 lb 3.2 oz (53.2 kg), SpO2 95 %. Gen:      No acute distress HEENT:  EOMI, sclera anicteric Neck:     No masses; no thyromegaly Lungs:    Clear to auscultation bilaterally; normal respiratory effort CV:         Regular rate and rhythm; no murmurs Abd:      + bowel sounds; soft, non-tender; no palpable masses, no distension Ext:    No edema; adequate peripheral perfusion Skin:      Warm and dry; no rash Neuro: alert and oriented x 3 Psych: normal mood and affect  Data Reviewed: Imaging: Chest x-ray 10/17/19-no acute cardiopulmonary abnormality. Chest x-ray 10/12/2020-hyperinflation, no active process I have reviewed the images personally.  PFTs: 01/11/2021 FVC 2.31 [72%], FEV1 1.16 [46%], F/F 50, TLC 5.17 [112%], DLCO 9.10 [47%] Severe obstruction with bronchodilator response, air trapping Severe diffusion defect  Labs: CBC 07/13/2019-WBC 5.1, eos 5%, absolute eosinophil count 255 CBC 10/12/2020-WBC 6.3, eos 2.6%, absolute eosinophil count 164 IgE 10/13/2019 1-19  Alpha-1 antitrypsin 10/12/2020-150, PI MM  Assessment:  Severe COPD Continue Trelegy inhaler, albuterol as needed  Active smoker Has not tolerated Chantix or Wellbutrin in the past due to hallucinations. Prescribe nicotine patches.  Use this with  over-the-counter nicotine gum Time spent counseling-5 minutes.  Reassess at return visit.  I gave the numbers of quit NOW helpline and chronic smoking cessation clinic  Referral for low-dose screening CT of the chest  Plan/Recommendations: Trelegy Smoking cessation Screening CT chest  Marshell Garfinkel MD [facility] 01/11/2021, 2:31 PM  CC: [name], NP  n

## 2021-01-16 ENCOUNTER — Other Ambulatory Visit: Payer: Self-pay | Admitting: *Deleted

## 2021-01-16 DIAGNOSIS — F17210 Nicotine dependence, cigarettes, uncomplicated: Secondary | ICD-10-CM

## 2021-01-16 DIAGNOSIS — Z87891 Personal history of nicotine dependence: Secondary | ICD-10-CM

## 2021-02-11 ENCOUNTER — Encounter: Payer: Self-pay | Admitting: Acute Care

## 2021-02-11 ENCOUNTER — Other Ambulatory Visit: Payer: Self-pay

## 2021-02-11 ENCOUNTER — Ambulatory Visit (INDEPENDENT_AMBULATORY_CARE_PROVIDER_SITE_OTHER)
Admission: RE | Admit: 2021-02-11 | Discharge: 2021-02-11 | Disposition: A | Discharge status: Home / Self Care | Payer: Medicare HMO | Source: Ambulatory Visit | Attending: Acute Care | Admitting: Acute Care

## 2021-02-11 ENCOUNTER — Ambulatory Visit (INDEPENDENT_AMBULATORY_CARE_PROVIDER_SITE_OTHER): Payer: Medicare HMO | Admitting: Acute Care

## 2021-02-11 VITALS — BP 120/80 | HR 102 | Temp 98.3°F | Ht 61.0 in | Wt 126.8 lb

## 2021-02-11 DIAGNOSIS — Z122 Encounter for screening for malignant neoplasm of respiratory organs: Secondary | ICD-10-CM | POA: Diagnosis not present

## 2021-02-11 DIAGNOSIS — F17210 Nicotine dependence, cigarettes, uncomplicated: Secondary | ICD-10-CM

## 2021-02-11 DIAGNOSIS — Z87891 Personal history of nicotine dependence: Secondary | ICD-10-CM

## 2021-02-11 MED ORDER — IPRATROPIUM-ALBUTEROL 0.5-2.5 (3) MG/3ML IN SOLN: 3.0000 mL | Freq: Two times a day (BID) | RESPIRATORY_TRACT | 2 refills | Status: DC

## 2021-02-11 NOTE — Progress Notes (Signed)
Shared Decision Making Visit Lung Cancer Screening Program [phone])   Eligibility:  Age 50 y.o.  Pack Years Smoking History Calculation 27 pack year smoking history (# packs/per year x # years smoked)  Recent History of coughing up blood  no  Unexplained weight loss? no ( >Than 15 pounds within the last 6 months )  Prior History Lung / other cancer no (Diagnosis within the last 5 years already requiring surveillance chest CT Scans).  Smoking Status Current Smoker  Former Smokers: Years since quit: NA  Quit Date: NA  Visit Components:  Discussion included one or more decision making aids. yes  Discussion included risk/benefits of screening. yes  Discussion included potential follow up diagnostic testing for abnormal scans. yes  Discussion included meaning and risk of over diagnosis. yes  Discussion included meaning and risk of False Positives. yes  Discussion included meaning of total radiation exposure. yes  Counseling Included:  Importance of adherence to annual lung cancer LDCT screening. yes  Impact of comorbidities on ability to participate in the program. yes  Ability and willingness to under diagnostic treatment. yes  Smoking Cessation Counseling:  Current Smokers:   Discussed importance of smoking cessation. yes  Information about tobacco cessation classes and interventions provided to patient. yes  Patient provided with "ticket" for LDCT Scan. yes  Symptomatic Patient. no  Counseling  Diagnosis Code: Tobacco Use Z72.0  Asymptomatic Patient yes  Counseling (Intermediate counseling: > three minutes counseling) K1601  Former Smokers:   Discussed the importance of maintaining cigarette abstinence. yes  Diagnosis Code: Personal History of Nicotine Dependence. U93.235  Information about tobacco cessation classes and interventions provided to patient. Yes  Patient provided with "ticket" for LDCT Scan. yes  Written Order for Lung Cancer  Screening with LDCT placed in Epic. Yes (CT Chest Lung Cancer Screening Low Dose W/O CM) [id number] Z12.2-Screening of respiratory organs Z87.891-Personal history of nicotine dependence  I have spent 25 minutes of face to face time with Vicki Mcmillan discussing the risks and benefits of lung cancer screening. We viewed a power point together that explained in detail the above noted topics. We paused at intervals to allow for questions to be asked and answered to ensure understanding.We discussed that the single most powerful action that she can take to decrease her risk of developing lung cancer is to quit smoking. We discussed whether or not she is ready to commit to setting a quit date. We discussed options for tools to aid in quitting smoking including nicotine replacement therapy, non-nicotine medications, support groups, Quit Smart classes, and behavior modification. We discussed that often times setting smaller, more achievable goals, such as eliminating 1 cigarette a day for a week and then 2 cigarettes a day for a week can be helpful in slowly decreasing the number of cigarettes smoked. This allows for a sense of accomplishment as well as providing a clinical benefit. I gave her the " Be Stronger Than Your Excuses" card with contact information for community resources, classes, free nicotine replacement therapy, and access to mobile apps, text messaging, and on-line smoking cessation help. I have also given her my card and contact information in the event she needs to contact me. We discussed the time and location of the scan, and that either Doroteo Glassman RN or I will call with the results within 24-48 hours of receiving them. I have offered her  a copy of the power point we viewed  as a resource in the event they need reinforcement of  the concepts we discussed today in the office. The patient verbalized understanding of all of  the above and had no further questions upon leaving the office. They have my  contact information in the event they have any further questions.  I spent 3 minutes counseling on smoking cessation and the health risks of continued tobacco abuse.  I explained to the patient that there has been a high incidence of coronary artery disease noted on these exams. I explained that this is a non-gated exam therefore degree or severity cannot be determined. This patient is not currently  on statin therapy. I have asked the patient to follow-up with their PCP regarding any incidental finding of coronary artery disease and management with diet or medication as their PCP  feels is clinically indicated. The patient verbalized understanding of the above and had no further questions upon completion of the visit.      Vicki Spatz, NP 02/11/2021 3:56 PM

## 2021-02-11 NOTE — Patient Instructions (Signed)
Thank you for participating in the [facility] Scre[facility]. It was our pleasure to meet you today. We will call you with the results of your scan within the next few days. Your scan will be assigned a Lung RADS category score by the physicians reading the scans.  This Lung RADS score determines follow up scanning.  See below for description of categories, and follow up screening recommendations. We will be in touch to schedule your follow up screening annually or based on recommendations of our providers. We will fax a copy of your scan results to your Primary Care Physician, or the physician who referred you to the program, to ensure they have the results. Please call the office if you have any questions or concerns regarding your scanning experience or results.  Our office number is [phone]. Please speak with Denise Phelps, RN. She is our Lung Cancer Screening RN. If she is unavailable when you call, please have the office staff send her a message. She will return your call at her earliest convenience. Remember, if your scan is normal, we will scan you annually as long as you continue to meet the criteria for the program. (Age 55-77, Current smoker or smoker who has quit within the last 15 years). If you are a smoker, remember, quitting is the single most powerful action that you can take to decrease your risk of lung cancer and other pulmonary, breathing related problems. We know quitting is hard, and we are here to help.  Please let us know if there is anything we can do to help you meet your goal of quitting. If you are a former smoker, congratulations. We are proud of you! Remain smoke free! Remember you can refer friends or family members through the number above.  We will screen them to make sure they meet criteria for the program. Thank you for helping us take better care of you by participating in Lung Screening.  Lung RADS Categories:  Lung RADS 1: no nodules  or definitely non-concerning nodules.  Recommendation is for a repeat annual scan in 12 months.  Lung RADS 2:  nodules that are non-concerning in appearance and behavior with a very low likelihood of becoming an active cancer. Recommendation is for a repeat annual scan in 12 months.  Lung RADS 3: nodules that are probably non-concerning , includes nodules with a low likelihood of becoming an active cancer.  Recommendation is for a 6-month repeat screening scan. Often noted after an upper respiratory illness. We will be in touch to make sure you have no questions, and to schedule your 6-month scan.  Lung RADS 4 A: nodules with concerning findings, recommendation is most often for a follow up scan in 3 months or additional testing based on our provider's assessment of the scan. We will be in touch to make sure you have no questions and to schedule the recommended 3 month follow up scan.  Lung RADS 4 B:  indicates findings that are concerning. We will be in touch with you to schedule additional diagnostic testing based on our provider's  assessment of the scan.   

## 2021-02-12 DIAGNOSIS — H9313 Tinnitus, bilateral: Secondary | ICD-10-CM | POA: Insufficient documentation

## 2021-02-12 DIAGNOSIS — M26609 Unspecified temporomandibular joint disorder, unspecified side: Secondary | ICD-10-CM | POA: Insufficient documentation

## 2021-02-12 DIAGNOSIS — H903 Sensorineural hearing loss, bilateral: Secondary | ICD-10-CM | POA: Insufficient documentation

## 2021-02-26 ENCOUNTER — Other Ambulatory Visit: Payer: Self-pay | Admitting: *Deleted

## 2021-02-26 DIAGNOSIS — F17210 Nicotine dependence, cigarettes, uncomplicated: Secondary | ICD-10-CM

## 2021-02-26 DIAGNOSIS — Z87891 Personal history of nicotine dependence: Secondary | ICD-10-CM

## 2021-02-26 NOTE — Progress Notes (Signed)
Please call patient and let them  know their  low dose Ct was read as a Lung RADS 2: nodules that are benign in appearance and behavior with a very low likelihood of becoming a clinically active cancer due to size or lack of growth. Recommendation per radiology is for a repeat LDCT in 12 months. Please let them  know we will order and schedule their  annual screening scan for 02/2022. Please let them  know there was notation of CAD on their  scan.  Please remind the patient  that this is a non-gated exam therefore degree or severity of disease  cannot be determined. Please have them  follow up with their PCP regarding potential risk factor modification, dietary therapy or pharmacologic therapy if clinically indicated. Pt.  is not  currently on statin therapy. Please place order for annual  screening scan for  02/2022 and fax results to PCP. Thanks so much. 

## 2021-03-14 ENCOUNTER — Telehealth (INDEPENDENT_AMBULATORY_CARE_PROVIDER_SITE_OTHER): Payer: Medicare HMO | Admitting: Psychiatry

## 2021-03-14 ENCOUNTER — Encounter (HOSPITAL_COMMUNITY): Payer: Self-pay | Admitting: Psychiatry

## 2021-03-14 ENCOUNTER — Other Ambulatory Visit: Payer: Self-pay

## 2021-03-14 DIAGNOSIS — F331 Major depressive disorder, recurrent, moderate: Secondary | ICD-10-CM | POA: Diagnosis not present

## 2021-03-14 DIAGNOSIS — F419 Anxiety disorder, unspecified: Secondary | ICD-10-CM | POA: Diagnosis not present

## 2021-03-14 MED ORDER — SERTRALINE HCL 100 MG PO TABS: 150.0000 mg | ORAL_TABLET | Freq: Every day | ORAL | 2 refills | Status: DC

## 2021-03-14 MED ORDER — CLONAZEPAM 0.5 MG PO TABS
ORAL_TABLET | ORAL | 1 refills | Status: DC
Start: 2021-03-14 — End: 2021-06-18

## 2021-03-14 NOTE — Progress Notes (Signed)
Virtual Visit via Telephone Note  I connected with Vicki Mcmillan on 03/14/21 at  2:00 PM EDT by telephone and verified that I am speaking with the correct person using two identifiers.  Location: Patient: Home Provider: Office   I discussed the limitations, risks, security and privacy concerns of performing an evaluation and management service by telephone and the availability of in person appointments. I also discussed with the patient that there may be a patient responsible charge related to this service. The patient expressed understanding and agreed to proceed.   History of Present Illness: Patient is evaluated by phone session.  She is compliant with medication.  She has cold symptoms and she is in contact with her PCP.  She has upper respiratory and cough.  Currently she is taking care of her nephews 51-year-old child.  She is also busy with her doctor's appointment.  She is seeing a pulmonologist and recently found that she had a small nodule and need further work-up.  Overall she feels her anxiety depression is stable.  She lives with her mother.  She denies any crying spells or any feeling of hopelessness or worthlessness.  Her energy level is good.  She is happy that she is able to gain few pounds weight.  She wants to keep the current medication.  She rarely takes Klonopin when she feels very nervous and anxious.  She need refills on her medication.   Past Psychiatric History: No h/osuicidal attempt or inpatient treatment. Sawpsychiatrist after Chantix caused increased anxiety, hallucination and memory impairment. Saw Dr. Jessy Oto at [facility] with good response. PCP prescribed Xanax.  Psychiatric Specialty Exam: Physical Exam  Review of Systems  Weight 130 lb (59 kg).There is no height or weight on file to calculate BMI.  General Appearance: NA  Eye Contact:  NA  Speech:  Clear and Coherent and Normal Rate  Volume:  Normal  Mood:  Euthymic  Affect:   NA  Thought Process:  Goal Directed  Orientation:  Full (Time, Place, and Person)  Thought Content:  Logical  Suicidal Thoughts:  No  Homicidal Thoughts:  No  Memory:  Immediate;   Good Recent;   Good Remote;   Good  Judgement:  Intact  Insight:  Present  Psychomotor Activity:  NA  Concentration:  Concentration: Fair and Attention Span: Fair  Recall:  Good  Fund of Knowledge:  Good  Language:  Good  Akathisia:  No  Handed:  Right  AIMS (if indicated):     Assets:  Communication Skills Desire for Improvement Housing Resilience Transportation  ADL's:  Intact  Cognition:  WNL  Sleep:   better with Melatonin      Assessment and Plan: Major depressive disorder, recurrent.  Anxiety.  Patient is stable on her current medication.  Continue Klonopin 0.5 mg to take as needed for severe anxiety and panic attack and Zoloft 50 mg daily.  Recommended to call us back if she is any question or any concern.  Follow-up in 3 months.  Follow Up Instructions:    I discussed the assessment and treatment plan with the patient. The patient was provided an opportunity to ask questions and all were answered. The patient agreed with the plan and demonstrated an understanding of the instructions.   The patient was advised to call back or seek an in-person evaluation if the symptoms worsen or if the condition fails to improve as anticipated.  I provided 17 minutes of non-face-to-face time during this encounter.   Arlyce Harman  Adele Schilder, MD

## 2021-04-28 ENCOUNTER — Other Ambulatory Visit: Payer: Self-pay | Admitting: Adult Health

## 2021-04-29 ENCOUNTER — Telehealth: Payer: Self-pay | Admitting: Pulmonary Disease

## 2021-05-01 ENCOUNTER — Telehealth: Payer: Self-pay | Admitting: Pulmonary Disease

## 2021-05-01 NOTE — Telephone Encounter (Signed)
ATC Patient. LM to call back 05/02/21, after 0800.

## 2021-05-02 NOTE — Telephone Encounter (Signed)
Called and spoke to pt. Pt states she was told by her insurance that is in the donut hole and her Trelegy will cost over $100 but generic Advair will cost $45 out of pocket. Pt also states she will be bringing by pt assistance forms for trelegy. Pt also requesting Trelegy samples if possible to help save her money.    Dr. Vaughan Browner, please advise if ok to provide trelegy samples and also send script in for generic Advair (for after pt finishes samples). Pt in coverage gap.

## 2021-05-02 NOTE — Telephone Encounter (Signed)
Yes. That will be fine!

## 2021-05-02 NOTE — Telephone Encounter (Signed)
Called patient but she did not answer. Left message for her to call back.  

## 2021-05-02 NOTE — Telephone Encounter (Signed)
Nothing noted in message. Will close encounter.  

## 2021-05-03 MED ORDER — TRELEGY ELLIPTA 200-62.5-25 MCG/INH IN AEPB: 1.0000 | INHALATION_SPRAY | Freq: Every day | RESPIRATORY_TRACT | 0 refills | Status: DC

## 2021-05-03 NOTE — Telephone Encounter (Signed)
Patient is returning phone call. Patient phone number is [phone].

## 2021-05-03 NOTE — Telephone Encounter (Signed)
Called and spoke with pt letting her know that Dr. Vaughan Browner said he was okay with [place] giving her samples of Trelegy 200 and she verbalized understanding. Asked her if she wanted to still have Rx for generic Advair sent to pharmacy and she said since she was able to get some samples she did not need both to happen. Samples placed up front for pt. Nothing further needed.

## 2021-05-03 NOTE — Telephone Encounter (Signed)
Spoke to patient, who is requesting sample of Trelegy 200. She would like to hold off of Advair Rx for now.  Triage, please place sample up front for pickup. Thanks

## 2021-05-10 ENCOUNTER — Telehealth: Payer: Self-pay | Admitting: Pulmonary Disease

## 2021-05-10 NOTE — Telephone Encounter (Signed)
Paperwork has been placed in Dr. Matilde Bash sign folder.

## 2021-05-10 NOTE — Telephone Encounter (Signed)
Noted. Will route message to MD/Nurse for f/u.

## 2021-05-15 MED ORDER — TRELEGY ELLIPTA 200-62.5-25 MCG/INH IN AEPB: 1.0000 | INHALATION_SPRAY | Freq: Every day | RESPIRATORY_TRACT | 5 refills | Status: DC

## 2021-05-15 NOTE — Telephone Encounter (Signed)
Trelegy [employer] patient assistance completed.  Stamped Trelegy prescription faxed with completed paperwork to [facility] patient assistance.  Confirmation fax received. Copy made and placed in follow up folder. Original forms placed in sealed envelope and mailed to Patient for her records. Called and spoke with Patient.  Patient aware forms were faxed and original being mailed back to Patient. Nothing further at this time.

## 2021-05-22 ENCOUNTER — Other Ambulatory Visit: Payer: Self-pay | Admitting: Pulmonary Disease

## 2021-06-08 ENCOUNTER — Other Ambulatory Visit (HOSPITAL_COMMUNITY): Payer: Self-pay | Admitting: Psychiatry

## 2021-06-08 DIAGNOSIS — F419 Anxiety disorder, unspecified: Secondary | ICD-10-CM

## 2021-06-08 DIAGNOSIS — F331 Major depressive disorder, recurrent, moderate: Secondary | ICD-10-CM

## 2021-06-18 ENCOUNTER — Telehealth (INDEPENDENT_AMBULATORY_CARE_PROVIDER_SITE_OTHER): Payer: Medicare HMO | Admitting: Psychiatry

## 2021-06-18 ENCOUNTER — Encounter (HOSPITAL_COMMUNITY): Payer: Self-pay | Admitting: Psychiatry

## 2021-06-18 ENCOUNTER — Other Ambulatory Visit: Payer: Self-pay

## 2021-06-18 DIAGNOSIS — F419 Anxiety disorder, unspecified: Secondary | ICD-10-CM

## 2021-06-18 DIAGNOSIS — F331 Major depressive disorder, recurrent, moderate: Secondary | ICD-10-CM

## 2021-06-18 MED ORDER — CLONAZEPAM 0.5 MG PO TABS: ORAL_TABLET | ORAL | 2 refills | Status: DC

## 2021-06-18 MED ORDER — SERTRALINE HCL 100 MG PO TABS: 150.0000 mg | ORAL_TABLET | Freq: Every day | ORAL | 2 refills | Status: DC

## 2021-06-18 NOTE — Progress Notes (Signed)
Virtual Visit via Telephone Note  I connected with Vicki Mcmillan on 06/18/21 at  2:00 PM EDT by telephone and verified that I am speaking with the correct person using two identifiers.  Location: Patient: Home Provider: Home office   I discussed the limitations, risks, security and privacy concerns of performing an evaluation and management service by telephone and the availability of in person appointments. I also discussed with the patient that there may be a patient responsible charge related to this service. The patient expressed understanding and agreed to proceed.   History of Present Illness: Patient is evaluated by phone session.  She is taking Klonopin and Zoloft.  She feels her anxiety and depression is okay but lately thinking about her son who is getting married on September 17 however have not fumigating with her.  Patient told son was very upset when she remarried and even though she is separated with her second husband, her son is still not happy.  Patient feels sometimes sad because son is more close to his father and goes to his stepmother more frequently.  Patient has a invitation for wedding on September 17 and she is planning to attend but wish that she can communicate more with the son.  She is sleeping good with the melatonin.  She is taking Klonopin at night that helps her anxiety and sleep.  Her appetite is okay.  Her weight is stable.  She does take care of her nephew who is 95-1/2 years old.  Patient denies tremor or shakes or any EPS.  Past Psychiatric History:  No h/o suicidal attempt or inpatient treatment.  Saw psychiatrist after Chantix caused increased anxiety, hallucination and memory impairment.  Saw Dr. Jessy Oto at [facility] and given Zoloft Abilify with good response.  PCP prescribed Xanax.    Psychiatric Specialty Exam: Physical Exam  Review of Systems  Weight 127 lb (57.6 kg).There is no height or weight on file to calculate BMI.  General Appearance: NA  Eye  Contact:  NA  Speech:  Normal Rate  Volume:  Normal  Mood:  Anxious  Affect:  NA  Thought Process:  Goal Directed  Orientation:  Full (Time, Place, and Person)  Thought Content:  WDL  Suicidal Thoughts:  No  Homicidal Thoughts:  No  Memory:  Immediate;   Good Recent;   Good Remote;   Good  Judgement:  Intact  Insight:  Present  Psychomotor Activity:  NA  Concentration:  Concentration: Fair and Attention Span: Fair  Recall:  Good  Fund of Knowledge:  Good  Language:  Good  Akathisia:  No  Handed:  Right  AIMS (if indicated):     Assets:  Communication Skills Desire for Improvement Housing Resilience Transportation  ADL's:  Intact  Cognition:  WNL  Sleep:         Assessment and Plan: Major depressive disorder, recurrent.  Anxiety.  Reassurance given.  Patient like to keep the current medication and hoping to have improved communication with his son in coming days.  She does talk to her mother who lives with her more frequently and that helps her.  Patient like to continue Klonopin 0.5 mg as needed for panic attacks and Zoloft 50 mg daily.  Recommended to call us back if she has any question or any concern.  Follow-up in 3 months.  Follow Up Instructions:    I discussed the assessment and treatment plan with the patient. The patient was provided an opportunity to ask questions and all were  answered. The patient agreed with the plan and demonstrated an understanding of the instructions.   The patient was advised to call back or seek an in-person evaluation if the symptoms worsen or if the condition fails to improve as anticipated.  I provided 19 minutes of non-face-to-face time during this encounter.   [name], MD

## 2021-07-29 ENCOUNTER — Other Ambulatory Visit: Payer: Self-pay

## 2021-07-29 ENCOUNTER — Encounter (HOSPITAL_COMMUNITY): Payer: Self-pay | Admitting: *Deleted

## 2021-07-29 ENCOUNTER — Encounter: Payer: Self-pay | Admitting: Pulmonary Disease

## 2021-07-29 ENCOUNTER — Ambulatory Visit: Payer: Medicare HMO | Admitting: Pulmonary Disease

## 2021-07-29 VITALS — BP 114/78 | HR 92 | Temp 98.2°F | Ht 61.0 in | Wt 126.6 lb

## 2021-07-29 DIAGNOSIS — F17210 Nicotine dependence, cigarettes, uncomplicated: Secondary | ICD-10-CM | POA: Diagnosis not present

## 2021-07-29 DIAGNOSIS — Z87891 Personal history of nicotine dependence: Secondary | ICD-10-CM

## 2021-07-29 DIAGNOSIS — J449 Chronic obstructive pulmonary disease, unspecified: Secondary | ICD-10-CM | POA: Diagnosis not present

## 2021-07-29 MED ORDER — ALBUTEROL SULFATE HFA 108 (90 BASE) MCG/ACT IN AERS: INHALATION_SPRAY | RESPIRATORY_TRACT | 5 refills | Status: DC

## 2021-07-29 NOTE — Progress Notes (Signed)
Vicki Mcmillan    [id number]    1971-03-20  Primary Care Physician:White, Delmar Landau, NP  Referring Physician: [name], NP [place] [address],  [place] [address]  Chief complaint: Follow up for COPD  HPI: 50 year old active smoker with history of COPD, anxiety, depression Told that she had mild COPD in 2017.  No PFTs on record Complains of increasing dyspnea on exertion with cough, congestion, wheezing.  She is on albuterol inhaler which she is using several times a day with some relief Recently seen by primary care and given a triple injection.  She was also prescribed trelegy but cannot afford the co-pay.  She plans on getting better inhaler with new insurance starting January.  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  No mold, hot tub, Jacuzzi Smoking history: 12.5-pack-year smoker.  Continues to smoke half pack per day Travel history: No significant family history Relevant family history: Aunt had asthma, COPD.  She was a smoker.  Interim History: She is working on quitting cigarettes. She is down to 4 cig a day from half-a-pack. She did not tolerate chantix in the past. She is using air purifier at home.   She does endorse using her albuterol about 2-3 times a day because she gets out of breath with moderate exertion. She is compliant with her Trelegy and Duonebs. Needs refill on her albuterol. No wheezing, cough or SOB at rest.   Outpatient Encounter Medications as of 07/29/2021  Medication Sig   albuterol (VENTOLIN HFA) 108 (90 Base) MCG/ACT inhaler INHALE 1 TO 2 PUFFS INTO THE LUNGS EVERY 6 HOURS AS NEEDED FOR WHEEZING OR SHORTNESS OF BREATH   clonazePAM (KLONOPIN) 0.5 MG tablet Take one tab as needed for panic attack   cyclobenzaprine (FLEXERIL) 5 MG tablet Take by mouth.   fluticasone (FLONASE) 50 MCG/ACT nasal spray Place 2 sprays into both nostrils daily.    Fluticasone-Umeclidin-Vilant (TRELEGY ELLIPTA) 200-62.5-25 MCG/INH AEPB Inhale 1 puff into the lungs daily.   ipratropium-albuterol (DUONEB) 0.5-2.5 (3) MG/3ML SOLN Take 3 mLs by nebulization 2 (two) times daily.   sertraline (ZOLOFT) 100 MG tablet Take 1.5 tablets (150 mg total) by mouth daily.   [place] 200-62.5-25 MCG/INH AEPB INHALE 1 PUFF INTO THE LUNGS DAILY   No facility-administered encounter medications on file as of 07/29/2021.   Physical Exam: Blood pressure 108/72, pulse 86, temperature (!) 97.4 F (36.3 C), height 5\' 1"  (1.549 m), weight 117 lb 3.2 oz (53.2 kg), SpO2 95 %. Gen:      Well-appearing woman. No acute distress HEENT:  EOMI, sclera anicteric Neck:     No masses; no thyromegaly Lungs:    Clear to auscultation bilaterally; normal respiratory effort; no wheezing or rales.  CV:         Regular rate and rhythm; no murmurs Abd:      + bowel sounds; soft, non-tender; no palpable masses, no distension Ext:    No edema; adequate peripheral perfusion Skin:      Warm and dry; no rash Neuro: alert and oriented x 3 Psych: normal mood and affect  Data Reviewed: Imaging: Chest x-ray 10/17/19-no acute cardiopulmonary abnormality. Chest x-ray 10/12/2020-hyperinflation, no active process I have reviewed the images personally. CT chest 02/13/2021: Tiny pulmonary nodule (1.8 mm) in LLL, evidence of emphysema but no evidence of malignancy.   PFTs: 01/11/2021 FVC 2.31 [72%], FEV1 1.16 [46%], F/F 50, TLC  5.17 [112%], DLCO 9.10 [47%] Severe obstruction with bronchodilator response, air trapping Severe diffusion defect  Labs: CBC 07/13/2019-WBC 5.1, eos 5%, absolute eosinophil count 255 CBC 10/12/2020-WBC 6.3, eos 2.6%, absolute eosinophil count 164 IgE 10/12/2020-19 WBC 4.8 11/22/20  Alpha-1 antitrypsin 10/12/2020-150, PI MM  Assessment:  Severe COPD Increased requirement of rescue inhaler up to 2-3 times a day. Will likely benefit from pulmonary rehab. Working on  complete smoking cessation. No recent hospitalization. Continue Trelegy inhaler, albuterol as needed. Can consider additional agents if symptoms not improved with pulmonary rehab and smoking cessation.   Active smoker Has not tolerated Chantix or Wellbutrin in the past due to hallucinations. Used nicotine patches and gums but did not tolerate. Down to 4 cigarettes/day. Continue to reassess at each clinic visit. Time spent counseling- 5 mins  Low-dose screening CT of the chest is benign, will need yearly screening.  States she has the number for the quit NOW helpline but wants to try on her own first. Will refer to the chronic smoking cessation clinic    Plan/Recommendations: Continue Trelegy, refilled albuterol Smoking cessation referral  Pulm rehab referral  Follow up in 6 months  Marshell Garfinkel MD [facility] 07/29/2021, 12:23 PM  CC: [name], NP

## 2021-07-29 NOTE — Patient Instructions (Signed)
Vicki Mcmillan, it was a pleasure to see you today. Continue to work on your smoking cessation. We are sending a smoking cessation referral to help provide you with more resources.   We are also sending you to pulmonary rehab to help improve your breathing and decrease the use of your albuterol.

## 2021-07-29 NOTE — Progress Notes (Signed)
Received referral from  Dr. Vaughan Browner for this pt to participate in pulmonary rehab with the diagnosis of COPD Stage 3.  Pt had PFT on 3/11 which showed FEV1/FEVC 50 and FEV1 post pred 46. Clinical review of pt follow up appt on 9/26 with Dr. Coy Saunas  Pulmonary office note.  Pt with Covid Risk Score - 1. Pt appropriate for scheduling for Pulmonary rehab when able as there is a waiting list.  Will forward to support staff for scheduling and verification of insurance eligibility/benefits with pt consent. Cherre Huger, BSN Cardiac and Training and development officer

## 2021-08-05 ENCOUNTER — Telehealth: Payer: Self-pay

## 2021-08-05 NOTE — Telephone Encounter (Signed)
Patient was contacted for smoking cessation initial call.

## 2021-08-05 NOTE — Telephone Encounter (Signed)
Attempted to contact patient to screen for pharmacist-lead smoking cessation telephone service; however, patient did not answer. Left voicemail instructing patient to call me back at her earliest convenience.   Pauletta Browns, Pharm.D. PGY-1 Pharmacy Resident [phone] 08/05/2021 5:36 PM

## 2021-08-05 NOTE — Progress Notes (Signed)
Smoking cessation referral pending- Left voicemail on 08/05/21 instructing patient to call me back at her earliest convenience.

## 2021-08-06 ENCOUNTER — Telehealth: Payer: Self-pay | Admitting: Pharmacist

## 2021-08-06 NOTE — Telephone Encounter (Signed)
-----   Message from Pauletta Browns, [facility] sent at 08/05/2021  5:53 PM EDT ----- Regarding: Smoking Cessation Initial Call Hello Dr. Vaughan Browner,  This message is to let you know: The patient you referred on 9/26 does not meet inclusion/exclusion criteria as she does not smoke at least 10 cigarettes/day. She currently smoking 4 cigarettes/day (morning and after meals)  I did see your comment about her benefiting from counseling and I was able to talk with patient for ~15 minutes. Today, she reports having at least 1 box of 4mg  nicotine gum at her house. Endorses she did not take the gum PRN cravings, but instead took it randomly throughout the day and she was not 'chewing and parking'. Discussed with patient that she will replace one cigarette a day with nicotine gum until she is tobacco free. Patient endorsed she enjoyed coloring and recently bought new books/pens, suggest she colors in the morning and after meals to help break her association of smoking with these activities.   Thank you for your time! Best,  Anderson IslandD. PGY-1 Pharmacy Resident [phone] 08/05/2021 5:54 PM

## 2021-08-15 ENCOUNTER — Telehealth (HOSPITAL_COMMUNITY): Payer: Self-pay

## 2021-08-15 NOTE — Telephone Encounter (Signed)
Pt insurance is active and benefits verified through [facility]. Co-pay $0.00, DED $0.00/$0.00 met, out of pocket $3,900.00/$106.51 met, co-insurance 20%. No pre-authorization required. [employer], 08/08/21 @ 312PM, TKW#[id number]   Will contact patient to see if she is interested in the Pulmonary Rehab Program.

## 2021-08-15 NOTE — Telephone Encounter (Signed)
Called patient to see if she is interested in the Pulmonary Rehab Program. Patient expressed interest. Explained scheduling process and went over insurance, patient verbalized understanding. Also adv pt where we are with scheduling for PR and that we have a backlog of 1-4 months. 

## 2021-09-06 ENCOUNTER — Other Ambulatory Visit (HOSPITAL_COMMUNITY): Payer: Self-pay | Admitting: Psychiatry

## 2021-09-06 DIAGNOSIS — F419 Anxiety disorder, unspecified: Secondary | ICD-10-CM

## 2021-09-06 DIAGNOSIS — F331 Major depressive disorder, recurrent, moderate: Secondary | ICD-10-CM

## 2021-09-16 ENCOUNTER — Telehealth (HOSPITAL_BASED_OUTPATIENT_CLINIC_OR_DEPARTMENT_OTHER): Payer: Medicare HMO | Admitting: Psychiatry

## 2021-09-16 ENCOUNTER — Other Ambulatory Visit: Payer: Self-pay

## 2021-09-16 ENCOUNTER — Encounter (HOSPITAL_COMMUNITY): Payer: Self-pay | Admitting: Psychiatry

## 2021-09-16 VITALS — Wt 127.0 lb

## 2021-09-16 DIAGNOSIS — F419 Anxiety disorder, unspecified: Secondary | ICD-10-CM

## 2021-09-16 DIAGNOSIS — F331 Major depressive disorder, recurrent, moderate: Secondary | ICD-10-CM | POA: Diagnosis not present

## 2021-09-16 MED ORDER — SERTRALINE HCL 100 MG PO TABS: 150.0000 mg | ORAL_TABLET | Freq: Every day | ORAL | 2 refills | Status: DC

## 2021-09-16 NOTE — Progress Notes (Signed)
Virtual Visit via Telephone Note  I connected with Vicki Mcmillan on 09/16/21 at  2:00 PM EST by telephone and verified that I am speaking with the correct person using two identifiers.  Location: Patient: Home Provider: Home Office   I discussed the limitations, risks, security and privacy concerns of performing an evaluation and management service by telephone and the availability of in person appointments. I also discussed with the patient that there may be a patient responsible charge related to this service. The patient expressed understanding and agreed to proceed.   History of Present Illness: Patient is evaluated by phone session.  She is taking Zoloft 150 mg and noticed that her anxiety depression is manageable.  She still sad about her son who had not communicated with her in a while but she does not think about him all the time.  She wish that he had contact her but she like to move on.  She is living with her mother who requires doctor's appointment and she takes her to the appointments.  Patient told her mother has back pain and she was referred to pain management but patient's mother does not want to take any narcotic pain medication.  Patient denies any crying spells or any feeling of hopelessness or worthlessness.  Patient has a plan to take her mother outside for Thanksgiving dinner.  She is sleeping good with the help of melatonin.  Her appetite is okay.  She really enjoys the 1-1/2-year-old nephew who she sees him every day.  Patient denies any crying spells or any feeling of hopelessness.  She denies any side effects from the sertraline.  Recently she had a visit with the pulmonologist and she is referred for pulmonary rehab as patient is still has flareups and requires inhaler.  However she denies any major panic attack and she still has leftover Klonopin which she takes only as needed.  Past Psychiatric History:  No h/o suicidal attempt or inpatient treatment.  Saw psychiatrist  after Chantix caused increased anxiety, hallucination and memory impairment.  Saw Dr. Jessy Oto at [facility] and given Zoloft Abilify with good response.  PCP prescribed Xanax.   Psychiatric Specialty Exam: Physical Exam  Review of Systems  Weight 127 lb (57.6 kg).There is no height or weight on file to calculate BMI.  General Appearance: NA  Eye Contact:  NA  Speech:  Clear and Coherent and Normal Rate  Volume:  Normal  Mood:  Euthymic  Affect:  NA  Thought Process:  Goal Directed  Orientation:  Full (Time, Place, and Person)  Thought Content:  WDL  Suicidal Thoughts:  No  Homicidal Thoughts:  No  Memory:  Immediate;   Good Recent;   Good Remote;   Good  Judgement:  Good  Insight:  Present  Psychomotor Activity:  NA  Concentration:  Concentration: Good and Attention Span: Good  Recall:  Good  Fund of Knowledge:  Good  Language:  Good  Akathisia:  No  Handed:  Right  AIMS (if indicated):     Assets:  Communication Skills Desire for Improvement Housing Social Support Transportation  ADL's:  Intact  Cognition:  WNL  Sleep:         Assessment and Plan: Major depressive disorder, recurrent.  Anxiety.  Patient doing okay on Zoloft 150 mg daily.  She takes the Klonopin only as needed when she has panic attack and she still has refill remaining and does not need a new prescription.  She is referred for pulmonary rehab as  patient is still have a flareup and requires inhaler.  I encouraged to keep that appointment.  Recommended to call us back if she is any question or any concern.  Follow-up in 3 months.    Follow Up Instructions:    I discussed the assessment and treatment plan with the patient. The patient was provided an opportunity to ask questions and all were answered. The patient agreed with the plan and demonstrated an understanding of the instructions.   The patient was advised to call back or seek an in-person evaluation if the symptoms worsen or if the condition  fails to improve as anticipated.  I provided 15 minutes of non-face-to-face time during this encounter.   [name], MD

## 2021-09-18 ENCOUNTER — Telehealth (HOSPITAL_COMMUNITY): Payer: Self-pay

## 2021-09-18 NOTE — Telephone Encounter (Signed)
Called patient to see if she was interested in participating in the Pulmonary Rehab Program. Patient stated yes. Patient will come in for orientation on 10/09/2021@10 :30am and will attend the 1:15pm exercise class.   Tourist information centre manager.

## 2021-10-08 ENCOUNTER — Telehealth (HOSPITAL_COMMUNITY): Payer: Self-pay

## 2021-10-08 NOTE — Telephone Encounter (Signed)
Called to see if Vicki Mcmillan was coming to orientation tomorrow. Left a message asking her to wear closed toed shoes and to call us if she could make it to her appointment. Gave department number.

## 2021-10-09 ENCOUNTER — Encounter (HOSPITAL_COMMUNITY)
Admission: RE | Admit: 2021-10-09 | Discharge: 2021-10-09 | Disposition: A | Discharge status: Home / Self Care | Payer: Medicare HMO | Source: Ambulatory Visit | Attending: Pulmonary Disease | Admitting: Pulmonary Disease

## 2021-10-09 ENCOUNTER — Encounter (HOSPITAL_COMMUNITY): Payer: Self-pay

## 2021-10-09 ENCOUNTER — Other Ambulatory Visit: Payer: Self-pay

## 2021-10-09 VITALS — BP 100/58 | Ht 63.0 in | Wt 127.2 lb

## 2021-10-09 DIAGNOSIS — J449 Chronic obstructive pulmonary disease, unspecified: Secondary | ICD-10-CM | POA: Insufficient documentation

## 2021-10-09 NOTE — Progress Notes (Signed)
Vicki Mcmillan 50 y.o. female Pulmonary Rehab Orientation Note This patient who was referred to Pulmonary Rehab by Dr. Vaughan Browner with the diagnosis of Stage 3 COPD arrived today in Cardiac and Pulmonary Rehab. She  arrived with ambulatory normal gait. She  does not carry portable oxygen.  Per pt, Vicki Mcmillan does not use oxygen . Color good, skin warm and dry. Patient is oriented to time and place. Patient's medical history, psychosocial health, and medications reviewed. Psychosocial assessment reveals pt lives with family. Vicki Mcmillan is currently unemployed, disabled. Pt hobbies include fishing, playing games on her phone,cooking,playing with her nephew,and bowling. Pt reports her stress level is low. Areas of stress/anxiety is none.  Pt does not exhibit signs of depression. . PHQ2/9 score 4/8. Vicki Mcmillan shows good  coping skills with positive outlook . Offered emotional support and reassurance. Will continue to monitor. Physical assessment reveals heart rate is normal, breath sounds clear to auscultation, no wheezes, rales, or rhonchi. Grip strength equal, strong. Distal pulses posterior tibial 3+reports  she does take medications as prescribed. Patient states she follows a Regular diet.  Patient's weight will be monitored closely. Demonstration and practice of PLB using pulse oximeter. Vicki Mcmillan able to return demonstration satisfactorily. Safety and hand hygiene in the exercise area reviewed with patient. Vicki Mcmillan voices understanding of the information reviewed. Department expectations discussed with patient and achievable goals were set. The patient shows enthusiasm about attending the program and we look forward to working with Vicki Mcmillan. Vicki Mcmillan completed a 6 min walk test today and is scheduled to begin exercise on 10/15/21 at 1:15 pm.  [id number]

## 2021-10-09 NOTE — Progress Notes (Signed)
Pulmonary Individual Treatment Plan  Patient Details  Name: Vicki Mcmillan MRN: [id number] Date of Birth: Nov 09, 1970 Referring Provider:   Lydiana Manson Pulmonary Rehab Walk Test from 10/09/2021 in [facility]  Referring Provider Mannam       Initial Encounter Date:  Flowsheet Row Pulmonary Rehab Walk Test from 10/09/2021 in [facility]  Date 10/09/21       Visit Diagnosis: Stage 3 severe COPD by GOLD classification ([place])  Patient's Home Medications on Admission:   Current Outpatient Medications:    albuterol (VENTOLIN HFA) 108 (90 Base) MCG/ACT inhaler, INHALE 1 TO 2 PUFFS INTO THE LUNGS EVERY 6 HOURS AS NEEDED FOR WHEEZING OR SHORTNESS OF BREATH, Disp: 18 g, Rfl: 5   clonazePAM (KLONOPIN) 0.5 MG tablet, Take one tab as needed for panic attack, Disp: 30 tablet, Rfl: 2   cyclobenzaprine (FLEXERIL) 5 MG tablet, Take by mouth., Disp: , Rfl:    fluticasone (FLONASE) 50 MCG/ACT nasal spray, Place 2 sprays into both nostrils daily., Disp: 15.8 g, Rfl: 0   Fluticasone-Umeclidin-Vilant (TRELEGY ELLIPTA) 200-62.5-25 MCG/INH AEPB, Inhale 1 puff into the lungs daily., Disp: 60 each, Rfl: 5   ipratropium-albuterol (DUONEB) 0.5-2.5 (3) MG/3ML SOLN, Take 3 mLs by nebulization 2 (two) times daily., Disp: 360 mL, Rfl: 2   melatonin 5 MG TABS, Take 5 mg by mouth at bedtime as needed., Disp: , Rfl:    sertraline (ZOLOFT) 100 MG tablet, Take 1.5 tablets (150 mg total) by mouth daily., Disp: 45 tablet, Rfl: 2   TRELEGY ELLIPTA 200-62.5-25 MCG/INH AEPB, INHALE 1 PUFF INTO THE LUNGS DAILY (Patient not taking: Reported on 10/09/2021), Disp: 60 each, Rfl: 5  Past Medical History: Past Medical History:  Diagnosis Date   Anxiety    COPD (chronic obstructive pulmonary disease) (HCC)    Depression    Panic attacks     Tobacco Use: Social History   Tobacco Use  Smoking Status Every Day   Packs/day: 0.50   Types: Cigarettes  Smokeless  Tobacco Never  Tobacco Comments   4 cigarettes/day 07/29/21-lt    Labs: Recent Review Flowsheet Data     Labs for ITP Cardiac and Pulmonary Rehab Latest Ref Rng & Units 09/09/2018   Cholestrol 0 - 200 mg/dL 181   LDLCALC 0 - 99 mg/dL 92   HDL >40 mg/dL 72   Trlycerides <150 mg/dL 86       Capillary Blood Glucose: No results found for: GLUCAP   Pulmonary Assessment Scores:  Pulmonary Assessment Scores     Row Name 10/09/21 1144         ADL UCSD   ADL Phase Entry     SOB Score total 44       CAT Score   CAT Score 10       mMRC Score   mMRC Score 3             UCSD: Self-administered rating of dyspnea associated with activities of daily living (ADLs) 6-point scale (0 = "not at all" to 5 = "maximal or unable to do because of breathlessness")  Scoring Scores range from 0 to 120.  Minimally important difference is 5 units  CAT: CAT can identify the health impairment of COPD patients and is better correlated with disease progression.  CAT has a scoring range of zero to 40. The CAT score is classified into four groups of low (less than 10), medium (10 - 20), high (21-30) and very high (  31-40) based on the impact level of disease on health status. A CAT score over 10 suggests significant symptoms.  A worsening CAT score could be explained by an exacerbation, poor medication adherence, poor inhaler technique, or progression of COPD or comorbid conditions.  CAT MCID is 2 points  mMRC: mMRC (Modified Medical Research Council) Dyspnea Scale is used to assess the degree of baseline functional disability in patients of respiratory disease due to dyspnea. No minimal important difference is established. A decrease in score of 1 point or greater is considered a positive change.   Pulmonary Function Assessment:  Pulmonary Function Assessment - 10/09/21 1121       Breath   Bilateral Breath Sounds Decreased   Both bases   Shortness of Breath Yes;Limiting activity;Panic with  Shortness of Breath             Exercise Target Goals: Exercise Program Goal: Individual exercise prescription set using results from initial 6 min walk test and THRR while considering  patient's activity barriers and safety.   Exercise Prescription Goal: Initial exercise prescription builds to 30-45 minutes a day of aerobic activity, 2-3 days per week.  Home exercise guidelines will be given to patient during program as part of exercise prescription that the participant will acknowledge.  Activity Barriers & Risk Stratification:  Activity Barriers & Cardiac Risk Stratification - 10/09/21 1113       Activity Barriers & Cardiac Risk Stratification   Activity Barriers Arthritis;Back Problems;Deconditioning;Shortness of Breath   Takes Flexeril that helps some but still has back pain with standing at long periods of time   Cardiac Risk Stratification Low             6 Minute Walk:  6 Minute Walk     Row Name 10/09/21 1221         6 Minute Walk   Phase Initial     Distance 1301 feet     Walk Time 6 minutes     # of Rest Breaks 0     MPH 2.46     METS 4.09     RPE 12     Perceived Dyspnea  1     VO2 Peak 14.32     Symptoms No     Resting HR 90 bpm     Resting BP 100/58     Resting Oxygen Saturation  92 %     Exercise Oxygen Saturation  during 6 min walk 90 %     Max Ex. HR 116 bpm     Max Ex. BP 110/60     2 Minute Post BP 100/58       Interval HR   1 Minute HR 97     2 Minute HR 98     3 Minute HR 116     4 Minute HR 112     5 Minute HR 114     6 Minute HR 111     2 Minute Post HR 86     Interval Heart Rate? Yes       Interval Oxygen   Interval Oxygen? Yes     Baseline Oxygen Saturation % 92 %     1 Minute Oxygen Saturation % 91 %     1 Minute Liters of Oxygen 0 L     2 Minute Oxygen Saturation % 94 %     2 Minute Liters of Oxygen 0 L     3 Minute Oxygen Saturation % 90 %  3 Minute Liters of Oxygen 0 L     4 Minute Oxygen Saturation % 92 %      4 Minute Liters of Oxygen 0 L     5 Minute Oxygen Saturation % 92 %     5 Minute Liters of Oxygen 0 L     6 Minute Oxygen Saturation % 93 %     6 Minute Liters of Oxygen 0 L     2 Minute Post Oxygen Saturation % 96 %     2 Minute Post Liters of Oxygen 0 L              Oxygen Initial Assessment:  Oxygen Initial Assessment - 10/09/21 1120       Home Oxygen   Home Oxygen Device None    Sleep Oxygen Prescription None    Home Exercise Oxygen Prescription None    Home Resting Oxygen Prescription None      Initial 6 min Walk   Oxygen Used None      Program Oxygen Prescription   Program Oxygen Prescription None      Intervention   Short Term Goals To learn and exhibit compliance with exercise, home and travel O2 prescription;To learn and understand importance of monitoring SPO2 with pulse oximeter and demonstrate accurate use of the pulse oximeter.;To learn and understand importance of maintaining oxygen saturations>88%;To learn and demonstrate proper pursed lip breathing techniques or other breathing techniques. ;To learn and demonstrate proper use of respiratory medications    Long  Term Goals Exhibits compliance with exercise, home  and travel O2 prescription;Verbalizes importance of monitoring SPO2 with pulse oximeter and return demonstration;Maintenance of O2 saturations>88%;Exhibits proper breathing techniques, such as pursed lip breathing or other method taught during program session;Compliance with respiratory medication;Demonstrates proper use of MDI's             Oxygen Re-Evaluation:   Oxygen Discharge (Final Oxygen Re-Evaluation):   Initial Exercise Prescription:  Initial Exercise Prescription - 10/09/21 1200       Date of Initial Exercise RX and Referring Provider   Date 10/09/21    Referring Provider Mannam    Expected Discharge Date 12/12/21      Recumbant Bike   Level 1    Watts 38    Minutes 15      Track   Minutes 15    METs 4.09       Prescription Details   Frequency (times per week) 2    Duration Progress to 30 minutes of continuous aerobic without signs/symptoms of physical distress      Intensity   Ratings of Perceived Exertion 11-13    Perceived Dyspnea 0-4      Progression   Progression Continue to progress workloads to maintain intensity without signs/symptoms of physical distress.      Resistance Training   Training Prescription Yes    Weight blue bands    Reps 10-15             Perform Capillary Blood Glucose checks as needed.  Exercise Prescription Changes:   Exercise Comments:   Exercise Goals and Review:   Exercise Goals     Row Name 10/09/21 1226             Exercise Goals   Increase Physical Activity Yes       Intervention Provide advice, education, support and counseling about physical activity/exercise needs.;Develop an individualized exercise prescription for aerobic and resistive training based on initial evaluation findings, risk stratification,  comorbidities and participant's personal goals.       Expected Outcomes Short Term: Attend rehab on a regular basis to increase amount of physical activity.;Long Term: Add in home exercise to make exercise part of routine and to increase amount of physical activity.;Long Term: Exercising regularly at least 3-5 days a week.       Increase Strength and Stamina Yes       Intervention Provide advice, education, support and counseling about physical activity/exercise needs.;Develop an individualized exercise prescription for aerobic and resistive training based on initial evaluation findings, risk stratification, comorbidities and participant's personal goals.       Expected Outcomes Short Term: Increase workloads from initial exercise prescription for resistance, speed, and METs.;Short Term: Perform resistance training exercises routinely during rehab and add in resistance training at home;Long Term: Improve cardiorespiratory fitness, muscular  endurance and strength as measured by increased METs and functional capacity (6MWT)       Able to understand and use rate of perceived exertion (RPE) scale Yes       Intervention Provide education and explanation on how to use RPE scale       Expected Outcomes Short Term: Able to use RPE daily in rehab to express subjective intensity level;Long Term:  Able to use RPE to guide intensity level when exercising independently       Able to understand and use Dyspnea scale Yes       Intervention Provide education and explanation on how to use Dyspnea scale       Expected Outcomes Short Term: Able to use Dyspnea scale daily in rehab to express subjective sense of shortness of breath during exertion;Long Term: Able to use Dyspnea scale to guide intensity level when exercising independently       Knowledge and understanding of Target Heart Rate Range (THRR) Yes       Intervention Provide education and explanation of THRR including how the numbers were predicted and where they are located for reference       Expected Outcomes Short Term: Able to state/look up THRR;Short Term: Able to use daily as guideline for intensity in rehab;Long Term: Able to use THRR to govern intensity when exercising independently       Understanding of Exercise Prescription Yes       Intervention Provide education, explanation, and written materials on patient's individual exercise prescription       Expected Outcomes Short Term: Able to explain program exercise prescription;Long Term: Able to explain home exercise prescription to exercise independently                Exercise Goals Re-Evaluation :   Discharge Exercise Prescription (Final Exercise Prescription Changes):   Nutrition:  Target Goals: Understanding of nutrition guidelines, daily intake of sodium 1500mg , cholesterol 200mg , calories 30% from fat and 7% or less from saturated fats, daily to have 5 or more servings of fruits and vegetables.  Biometrics:  Pre  Biometrics - 10/09/21 1115       Pre Biometrics   Grip Strength --              Nutrition Therapy Plan and Nutrition Goals:   Nutrition Assessments:  MEDIFICTS Score Key: ?70 Need to make dietary changes  40-70 Heart Healthy Diet ? 40 Therapeutic Level Cholesterol Diet   Picture Your Plate Scores: <91 Unhealthy dietary pattern with much room for improvement. 41-50 Dietary pattern unlikely to meet recommendations for good health and room for improvement. 51-60 More healthful dietary pattern,  with some room for improvement.  >60 Healthy dietary pattern, although there may be some specific behaviors that could be improved.    Nutrition Goals Re-Evaluation:   Nutrition Goals Discharge (Final Nutrition Goals Re-Evaluation):   Psychosocial: Target Goals: Acknowledge presence or absence of significant depression and/or stress, maximize coping skills, provide positive support system. Participant is able to verbalize types and ability to use techniques and skills needed for reducing stress and depression.  Initial Review & Psychosocial Screening:  Initial Psych Review & Screening - 10/09/21 1150       Initial Review   Current issues with History of Depression   Has history of panic attack. She is on medication Klonopin and Sertaline and also see a psychatrist every 3 months     Family Dynamics   Good Support System? Yes   Lives with Mother     Barriers   Psychosocial barriers to participate in program There are no identifiable barriers or psychosocial needs.      Screening Interventions   Interventions Encouraged to exercise             Quality of Life Scores:  Scores of 19 and below usually indicate a poorer quality of life in these areas.  A difference of  2-3 points is a clinically meaningful difference.  A difference of 2-3 points in the total score of the Quality of Life Index has been associated with significant improvement in overall quality of life,  self-image, physical symptoms, and general health in studies assessing change in quality of life.  PHQ-9: Recent Review Flowsheet Data     Depression screen [facility] 2/9 10/09/2021   Decreased Interest 3   Down, Depressed, Hopeless 1   PHQ - 2 Score 4   Altered sleeping 1   Tired, decreased energy 3   Change in appetite 0   Feeling bad or failure about yourself  0   Trouble concentrating 0   Moving slowly or fidgety/restless 0   Suicidal thoughts 0   PHQ-9 Score 8   Difficult doing work/chores Not difficult at all      Interpretation of Total Score  Total Score Depression Severity:  1-4 = Minimal depression, 5-9 = Mild depression, 10-14 = Moderate depression, 15-19 = Moderately severe depression, 20-27 = Severe depression   Psychosocial Evaluation and Intervention:  Psychosocial Evaluation - 10/09/21 1200       Psychosocial Evaluation & Interventions   Interventions Encouraged to exercise with the program and follow exercise prescription    Comments Brittlyn has a history of depression and panic attacks. She is on Klonopin and Zoloft and also has an appointment with her psychiatrist every 3 months.    Expected Outcomes For Hedi to be able to participate in PR without any psychosocial concerns or barriers    Continue Psychosocial Services  Follow up required by staff             Psychosocial Re-Evaluation:   Psychosocial Discharge (Final Psychosocial Re-Evaluation):   Education: Education Goals: Education classes will be provided on a weekly basis, covering required topics. Participant will state understanding/return demonstration of topics presented.  Learning Barriers/Preferences:   Education Topics: Risk Factor Reduction:  -Group instruction that is supported by a PowerPoint presentation. Instructor discusses the definition of a risk factor, different risk factors for pulmonary disease, and how the heart and lungs work together.     Nutrition for Pulmonary Patient:   -Group instruction provided by PowerPoint slides, verbal discussion, and written materials to  support subject matter. The instructor gives an explanation and review of healthy diet recommendations, which includes a discussion on weight management, recommendations for fruit and vegetable consumption, as well as protein, fluid, caffeine, fiber, sodium, sugar, and alcohol. Tips for eating when patients are short of breath are discussed.   Pursed Lip Breathing:  -Group instruction that is supported by demonstration and informational handouts. Instructor discusses the benefits of pursed lip and diaphragmatic breathing and detailed demonstration on how to preform both.     Oxygen Safety:  -Group instruction provided by PowerPoint, verbal discussion, and written material to support subject matter. There is an overview of "What is Oxygen" and "Why do we need it".  Instructor also reviews how to create a safe environment for oxygen use, the importance of using oxygen as prescribed, and the risks of noncompliance. There is a brief discussion on traveling with oxygen and resources the patient may utilize.   Oxygen Equipment:  -Group instruction provided by [facility] Staff utilizing handouts, written materials, and equipment demonstrations.   Signs and Symptoms:  -Group instruction provided by written material and verbal discussion to support subject matter. Warning signs and symptoms of infection, stroke, and heart attack are reviewed and when to call the physician/911 reinforced. Tips for preventing the spread of infection discussed.   Advanced Directives:  -Group instruction provided by verbal instruction and written material to support subject matter. Instructor reviews Advanced Directive laws and proper instruction for filling out document.   Pulmonary Video:  -Group video education that reviews the importance of medication and oxygen compliance, exercise, good nutrition, pulmonary hygiene, and  pursed lip and diaphragmatic breathing for the pulmonary patient.   Exercise for the Pulmonary Patient:  -Group instruction that is supported by a PowerPoint presentation. Instructor discusses benefits of exercise, core components of exercise, frequency, duration, and intensity of an exercise routine, importance of utilizing pulse oximetry during exercise, safety while exercising, and options of places to exercise outside of rehab.     Pulmonary Medications:  -Verbally interactive group education provided by instructor with focus on inhaled medications and proper administration.   Anatomy and Physiology of the Respiratory System and Intimacy:  -Group instruction provided by PowerPoint, verbal discussion, and written material to support subject matter. Instructor reviews respiratory cycle and anatomical components of the respiratory system and their functions. Instructor also reviews differences in obstructive and restrictive respiratory diseases with examples of each. Intimacy, Sex, and Sexuality differences are reviewed with a discussion on how relationships can change when diagnosed with pulmonary disease. Common sexual concerns are reviewed.   MD DAY -A group question and answer session with a medical doctor that allows participants to ask questions that relate to their pulmonary disease state.   OTHER EDUCATION -Group or individual verbal, written, or video instructions that support the educational goals of the pulmonary rehab program.   Holiday Eating Survival Tips:  -Group instruction provided by PowerPoint slides, verbal discussion, and written materials to support subject matter. The instructor gives patients tips, tricks, and techniques to help them not only survive but enjoy the holidays despite the onslaught of food that accompanies the holidays.   Knowledge Questionnaire Score:  Knowledge Questionnaire Score - 10/09/21 1144       Knowledge Questionnaire Score   Pre Score  15/18             Core Components/Risk Factors/Patient Goals at Admission:  Personal Goals and Risk Factors at Admission - 10/09/21 1251  Core Components/Risk Factors/Patient Goals on Admission   Tobacco Cessation Yes    Number of packs per day .25    Intervention Assist the participant in steps to quit. Provide individualized education and counseling about committing to Tobacco Cessation, relapse prevention, and pharmacological support that can be provided by physician.;Advice worker, assist with locating and accessing local/national Quit Smoking programs, and support quit date choice.    Expected Outcomes Short Term: Will demonstrate readiness to quit, by selecting a quit date.;Short Term: Will quit all tobacco product use, adhering to prevention of relapse plan.;Long Term: Complete abstinence from all tobacco products for at least 12 months from quit date.    Improve shortness of breath with ADL's Yes    Intervention Provide education, individualized exercise plan and daily activity instruction to help decrease symptoms of SOB with activities of daily living.    Expected Outcomes Short Term: Improve cardiorespiratory fitness to achieve a reduction of symptoms when performing ADLs;Long Term: Be able to perform more ADLs without symptoms or delay the onset of symptoms    Increase knowledge of respiratory medications and ability to use respiratory devices properly  Yes    Intervention Provide education and demonstration as needed of appropriate use of medications, inhalers, and oxygen therapy.    Expected Outcomes Short Term: Achieves understanding of medications use. Understands that oxygen is a medication prescribed by physician. Demonstrates appropriate use of inhaler and oxygen therapy.;Long Term: Maintain appropriate use of medications, inhalers, and oxygen therapy.             Core Components/Risk Factors/Patient Goals Review:    Core Components/Risk  Factors/Patient Goals at Discharge (Final Review):    ITP Comments:  Dr. Rodman Pickle is Medical Director for Pulmonary Rehab at [facility].

## 2021-10-09 NOTE — Progress Notes (Signed)
Vicki Mcmillan 50 y.o. female  Initial Psychosocial Assessment  Pt psychosocial assessment reveals pt lives with their family. Pt is currently unemployed, disabled. Pt hobbies include fishing, playing games on her phone,cooking, playing with her nephew and bowling. Pt reports her stress level is low.  Pt does not exhibit signs of depression. Pt shows good  coping skills with positive outlook . Offered emotional support and reassurance. Will continue to monitor for any psychosocial concerns or barriers.    10/09/2021 12:36 PM

## 2021-10-15 ENCOUNTER — Encounter (HOSPITAL_COMMUNITY)
Admission: RE | Admit: 2021-10-15 | Discharge: 2021-10-15 | Disposition: A | Discharge status: Home / Self Care | Payer: Medicare HMO | Source: Ambulatory Visit | Attending: Pulmonary Disease | Admitting: Pulmonary Disease

## 2021-10-15 ENCOUNTER — Other Ambulatory Visit: Payer: Self-pay

## 2021-10-15 VITALS — Wt 127.2 lb

## 2021-10-15 DIAGNOSIS — J449 Chronic obstructive pulmonary disease, unspecified: Secondary | ICD-10-CM

## 2021-10-15 NOTE — Progress Notes (Addendum)
Daily Session Note  Patient Details  Name: Vicki Mcmillan MRN: [id number] Date of Birth: 05/12/1971 Referring Provider:   Jaclynn Manson Pulmonary Rehab Walk Test from 10/09/2021 in [place]  Referring Provider Mannam       Encounter Date: 10/15/2021  Check In:  Session Check In - 10/15/21 1415       Check-In   Supervising physician immediately available to respond to emergencies [facility] immediately available    Physician(s) Dr. Broadus John    Location [facility]    Staff Present Rodney Langton, Cathleen Fears, MS, ACSM-CEP, Exercise Physiologist    Virtual Visit No    Medication changes reported     No    Fall or balance concerns reported    No    Tobacco Cessation No Change    Warm-up and Cool-down Performed as group-led instruction    Resistance Training Performed Yes    VAD Patient? No    PAD/SET Patient? No      Pain Assessment   Currently in Pain? No/denies    Multiple Pain Sites No             Capillary Blood Glucose: No results found for this or any previous visit (from the past 24 hour(s)).   Exercise Prescription Changes - 10/15/21 1400       Response to Exercise   Blood Pressure (Admit) 118/78    Blood Pressure (Exercise) 138/70    Blood Pressure (Exit) 103/72    Heart Rate (Admit) 95 bpm    Heart Rate (Exercise) 112 bpm    Heart Rate (Exit) 81 bpm    Oxygen Saturation (Admit) 96 %    Oxygen Saturation (Exercise) 94 %    Oxygen Saturation (Exit) 95 %    Rating of Perceived Exertion (Exercise) 11    Perceived Dyspnea (Exercise) 1    Duration Progress to 30 minutes of  aerobic without signs/symptoms of physical distress    Intensity THRR unchanged   68-135     Resistance Training   Training Prescription Yes    Weight red bands    Reps 10-15    Time 10 Minutes      Recumbant Bike   Level 1.5    Minutes 15    METs 2.8      Track   Laps 16    Minutes 15    METs 2.86              Social History   Tobacco Use  Smoking Status Every Day   Packs/day: 0.50   Types: Cigarettes  Smokeless Tobacco Never  Tobacco Comments   4 cigarettes/day 07/29/21-lt    Goals Met:  Exercise tolerated well No report of concerns or symptoms today Strength training completed today  Goals Unmet:  Not Applicable  Comments: Service time is from 1312 to 1430. Completed 1st day of exercise.    Dr. Rodman Pickle is Medical Director for Pulmonary Rehab at [facility].

## 2021-10-17 ENCOUNTER — Encounter (HOSPITAL_COMMUNITY)
Admission: RE | Admit: 2021-10-17 | Discharge: 2021-10-17 | Disposition: A | Discharge status: Home / Self Care | Payer: Medicare HMO | Source: Ambulatory Visit | Attending: Pulmonary Disease | Admitting: Pulmonary Disease

## 2021-10-17 ENCOUNTER — Other Ambulatory Visit: Payer: Self-pay

## 2021-10-17 DIAGNOSIS — J449 Chronic obstructive pulmonary disease, unspecified: Secondary | ICD-10-CM

## 2021-10-17 NOTE — Progress Notes (Signed)
Daily Session Note  Patient Details  Name: Vicki Mcmillan MRN: [id number] Date of Birth: Feb 18, 1971 Referring Provider:   Suhailah Manson Pulmonary Rehab Walk Test from 10/09/2021 in [place]  Referring Provider Mannam       Encounter Date: 10/17/2021  Check In:  Session Check In - 10/17/21 1416       Check-In   Supervising physician immediately available to respond to emergencies [facility] immediately available    Physician(s) Dr. Maren Beach    Location [facility]    Staff Present Rodney Langton, Cathleen Fears, MS, ACSM-CEP, Exercise Physiologist    Virtual Visit No    Medication changes reported     No    Fall or balance concerns reported    No    Tobacco Cessation No Change    Warm-up and Cool-down Performed as group-led instruction    Resistance Training Performed Yes    VAD Patient? No    PAD/SET Patient? No      Pain Assessment   Currently in Pain? No/denies    Multiple Pain Sites No             Capillary Blood Glucose: No results found for this or any previous visit (from the past 24 hour(s)).    Social History   Tobacco Use  Smoking Status Every Day   Packs/day: 0.50   Types: Cigarettes  Smokeless Tobacco Never  Tobacco Comments   4 cigarettes/day 07/29/21-lt    Goals Met:  Exercise tolerated well No report of concerns or symptoms today Strength training completed today  Goals Unmet:  Not Applicable   Comments: Service time is from 1310 to 1440    Dr. Rodman Pickle is Medical Director for Pulmonary Rehab at [facility].

## 2021-10-22 ENCOUNTER — Other Ambulatory Visit: Payer: Self-pay

## 2021-10-22 ENCOUNTER — Encounter (HOSPITAL_COMMUNITY)
Admission: RE | Admit: 2021-10-22 | Discharge: 2021-10-22 | Disposition: A | Discharge status: Home / Self Care | Payer: Medicare HMO | Source: Ambulatory Visit | Attending: Pulmonary Disease | Admitting: Pulmonary Disease

## 2021-10-22 DIAGNOSIS — J449 Chronic obstructive pulmonary disease, unspecified: Secondary | ICD-10-CM | POA: Diagnosis not present

## 2021-10-22 NOTE — Progress Notes (Signed)
Daily Session Note ° °Patient Details  °Name: Vicki Mcmillan °MRN: [id number] °Date of Birth: 11/26/1970 °Referring Provider:   °Flowsheet Row Pulmonary Rehab Walk Test from 10/09/2021 in [facility]  °Referring Provider Mannam  ° °  ° ° °Encounter Date: 10/22/2021 ° °Check In: ° Session Check In - 10/22/21 1409   ° °  ° Check-In  ° Supervising physician immediately available to respond to emergencies [facility] immediately available   ° Physician(s) Dr. Ramesh   ° Location [facility]   ° Staff Present  , RN;Kaylee Davis, MS, ACSM-CEP, Exercise Physiologist   ° Virtual Visit No   ° Medication changes reported     No   ° Fall or balance concerns reported    No   ° Tobacco Cessation No Change   ° Warm-up and Cool-down Performed as group-led instruction   ° Resistance Training Performed Yes   ° VAD Patient? No   ° PAD/SET Patient? No   °  ° Pain Assessment  ° Currently in Pain? No/denies   ° Multiple Pain Sites No   ° °  °  ° °  ° ° °Capillary Blood Glucose: °No results found for this or any previous visit (from the past 24 hour(s)). ° ° ° °Social History  ° °Tobacco Use  °Smoking Status Every Day  ° Packs/day: 0.50  ° Types: Cigarettes  °Smokeless Tobacco Never  °Tobacco Comments  ° 4 cigarettes/day 07/29/21-lt  ° ° °Goals Met:  °Exercise tolerated well °No report of concerns or symptoms today °Strength training completed today ° °Goals Unmet:  °Not Applicable ° °Comments: Service time is from 1324 to 1440 ° ° ° °Dr. Jane Ellison is Medical Director for Pulmonary Rehab at [facility].  °

## 2021-10-24 ENCOUNTER — Encounter (HOSPITAL_COMMUNITY): Payer: Medicare HMO

## 2021-10-24 ENCOUNTER — Telehealth (HOSPITAL_COMMUNITY): Payer: Self-pay | Admitting: Family Medicine

## 2021-10-29 ENCOUNTER — Other Ambulatory Visit: Payer: Self-pay

## 2021-10-29 ENCOUNTER — Encounter (HOSPITAL_COMMUNITY)
Admission: RE | Admit: 2021-10-29 | Discharge: 2021-10-29 | Disposition: A | Discharge status: Home / Self Care | Payer: Medicare HMO | Source: Ambulatory Visit | Attending: Pulmonary Disease | Admitting: Pulmonary Disease

## 2021-10-29 VITALS — Wt 128.3 lb

## 2021-10-29 DIAGNOSIS — J449 Chronic obstructive pulmonary disease, unspecified: Secondary | ICD-10-CM | POA: Diagnosis not present

## 2021-10-29 NOTE — Progress Notes (Signed)
Daily Session Note  Patient Details  Name: Vicki Mcmillan MRN: [id number] Date of Birth: April 03, 1971 Referring Provider:   Jupiter Manson Pulmonary Rehab Walk Test from 10/09/2021 in [place]  Referring Provider Mannam       Encounter Date: 10/29/2021  Check In:  Session Check In - 10/29/21 1431       Check-In   Supervising physician immediately available to respond to emergencies [facility] immediately available    Physician(s) Dr. [place] Pink    Location [facility]    Staff Present Rodney Langton, Cathleen Fears, MS, ACSM-CEP, Exercise Physiologist    Virtual Visit No    Medication changes reported     No    Fall or balance concerns reported    No    Tobacco Cessation No Change    Warm-up and Cool-down Performed as group-led instruction    Resistance Training Performed Yes    VAD Patient? No    PAD/SET Patient? No      Pain Assessment   Currently in Pain? No/denies    Multiple Pain Sites No             Capillary Blood Glucose: No results found for this or any previous visit (from the past 24 hour(s)).   Exercise Prescription Changes - 10/29/21 1500       Response to Exercise   Blood Pressure (Admit) 110/60    Blood Pressure (Exercise) 110/60    Blood Pressure (Exit) 106/70    Heart Rate (Admit) 93 bpm    Heart Rate (Exercise) 130 bpm    Heart Rate (Exit) 99 bpm    Oxygen Saturation (Admit) 93 %    Oxygen Saturation (Exercise) 93 %    Oxygen Saturation (Exit) 95 %    Rating of Perceived Exertion (Exercise) 12    Perceived Dyspnea (Exercise) 1    Duration Progress to 30 minutes of  aerobic without signs/symptoms of physical distress    Intensity THRR unchanged      Progression   Progression Continue to progress workloads to maintain intensity without signs/symptoms of physical distress.      Resistance Training   Training Prescription Yes    Weight Red bands    Reps 10-15    Time 10 Minutes       Recumbant Bike   Level 1.5    Minutes 15    METs 3      Track   Laps 22    Minutes 15             Social History   Tobacco Use  Smoking Status Every Day   Packs/day: 0.50   Types: Cigarettes  Smokeless Tobacco Never  Tobacco Comments   4 cigarettes/day 07/29/21-lt    Goals Met:  Exercise tolerated well No report of concerns or symptoms today Strength training completed today  Goals Unmet:  Not Applicable  Comments: Service time is from 1317 to 1441    Dr. Rodman Pickle is Medical Director for Pulmonary Rehab at [facility].

## 2021-10-31 ENCOUNTER — Encounter (HOSPITAL_COMMUNITY)
Admission: RE | Admit: 2021-10-31 | Discharge: 2021-10-31 | Disposition: A | Discharge status: Home / Self Care | Payer: Medicare HMO | Source: Ambulatory Visit | Attending: Pulmonary Disease | Admitting: Pulmonary Disease

## 2021-10-31 ENCOUNTER — Other Ambulatory Visit: Payer: Self-pay

## 2021-10-31 DIAGNOSIS — J449 Chronic obstructive pulmonary disease, unspecified: Secondary | ICD-10-CM

## 2021-10-31 NOTE — Progress Notes (Signed)
Daily Session Note  Patient Details  Name: Vicki Mcmillan MRN: [id number] Date of Birth: October 05, 1971 Referring Provider:   Marni Manson Pulmonary Rehab Walk Test from 10/09/2021 in [place]  Referring Provider Mannam       Encounter Date: 10/31/2021  Check In:  Session Check In - 10/31/21 1434       Check-In   Supervising physician immediately available to respond to emergencies [facility] immediately available    Physician(s) Dr. Sloan Leiter    Location [facility]    Staff Present Rodney Langton, Cathleen Fears, MS, ACSM-CEP, Exercise Physiologist;David Lilyan Punt, MS, ACSM-CEP, CCRP, Exercise Physiologist;Jetta Walker BS, ACSM EP-C, Exercise Physiologist    Virtual Visit No    Medication changes reported     No    Fall or balance concerns reported    No    Tobacco Cessation No Change    Warm-up and Cool-down Performed as group-led instruction    Resistance Training Performed Yes    VAD Patient? No    PAD/SET Patient? No      Pain Assessment   Currently in Pain? No/denies    Multiple Pain Sites No             Capillary Blood Glucose: No results found for this or any previous visit (from the past 24 hour(s)).    Social History   Tobacco Use  Smoking Status Every Day   Packs/day: 0.50   Types: Cigarettes  Smokeless Tobacco Never  Tobacco Comments   4 cigarettes/day 07/29/21-lt    Goals Met:  Exercise tolerated well No report of concerns or symptoms today Strength training completed today  Goals Unmet:  Not Applicable  Comments: Service time is from 1320 to 1450    Dr. Rodman Pickle is Medical Director for Pulmonary Rehab at [facility].

## 2021-11-05 ENCOUNTER — Encounter (HOSPITAL_COMMUNITY)
Admission: RE | Admit: 2021-11-05 | Discharge: 2021-11-05 | Disposition: A | Discharge status: Home / Self Care | Payer: Medicare HMO | Source: Ambulatory Visit | Attending: Pulmonary Disease | Admitting: Pulmonary Disease

## 2021-11-05 ENCOUNTER — Other Ambulatory Visit: Payer: Self-pay

## 2021-11-05 DIAGNOSIS — J449 Chronic obstructive pulmonary disease, unspecified: Secondary | ICD-10-CM | POA: Diagnosis present

## 2021-11-05 NOTE — Progress Notes (Signed)
Daily Session Note  Patient Details  Name: Vicki Mcmillan MRN: [id number] Date of Birth: 1971-05-04 Referring Provider:   Amal Manson Pulmonary Rehab Walk Test from 10/09/2021 in [place]  Referring Provider Mannam       Encounter Date: 11/05/2021  Check In:  Session Check In - 11/05/21 1417       Check-In   Supervising physician immediately available to respond to emergencies [facility] immediately available    Physician(s) Dr. Arbutus Ped    Location [facility]    Staff Present Rosebud Poles, RN, Quentin Ore, MS, ACSM-CEP, Exercise Physiologist;Lisa Ysidro Evert, RN    Virtual Visit No    Medication changes reported     No    Fall or balance concerns reported    No    Tobacco Cessation No Change    Warm-up and Cool-down Performed as group-led instruction    Resistance Training Performed Yes    VAD Patient? No    PAD/SET Patient? No      Pain Assessment   Currently in Pain? No/denies    Multiple Pain Sites No             Capillary Blood Glucose: No results found for this or any previous visit (from the past 24 hour(s)).    Social History   Tobacco Use  Smoking Status Every Day   Packs/day: 0.50   Types: Cigarettes  Smokeless Tobacco Never  Tobacco Comments   4 cigarettes/day 07/29/21-lt    Goals Met:  Proper associated with RPD/PD & O2 Sat Exercise tolerated well No report of concerns or symptoms today Strength training completed today  Goals Unmet:  Not Applicable  Comments: Service time is from 1319 to 1430.    Dr. Rodman Pickle is Medical Director for Pulmonary Rehab at [facility].

## 2021-11-06 NOTE — Progress Notes (Signed)
Pulmonary Individual Treatment Plan  Patient Details  Name: Vicki Mcmillan MRN: [id number] Date of Birth: [birth year] Referring Provider:   Chantella Manson Pulmonary Rehab Walk Test from 10/09/2021 in [place]  Referring Provider Mannam       Initial Encounter Date:  Flowsheet Row Pulmonary Rehab Walk Test from 10/09/2021 in [place]  Date 10/09/21       Visit Diagnosis: Stage 3 severe COPD by GOLD classification ([place])  Patient's Home Medications on Admission:   Current Outpatient Medications:    albuterol (VENTOLIN HFA) 108 (90 Base) MCG/ACT inhaler, INHALE 1 TO 2 PUFFS INTO THE LUNGS EVERY 6 HOURS AS NEEDED FOR WHEEZING OR SHORTNESS OF BREATH, Disp: 18 g, Rfl: 5   clonazePAM (KLONOPIN) 0.5 MG tablet, Take one tab as needed for panic attack, Disp: 30 tablet, Rfl: 2   cyclobenzaprine (FLEXERIL) 5 MG tablet, Take by mouth., Disp: , Rfl:    fluticasone (FLONASE) 50 MCG/ACT nasal spray, Place 2 sprays into both nostrils daily., Disp: 15.8 g, Rfl: 0   Fluticasone-Umeclidin-Vilant (TRELEGY ELLIPTA) 200-62.5-25 MCG/INH AEPB, Inhale 1 puff into the lungs daily., Disp: 60 each, Rfl: 5   ipratropium-albuterol (DUONEB) 0.5-2.5 (3) MG/3ML SOLN, Take 3 mLs by nebulization 2 (two) times daily., Disp: 360 mL, Rfl: 2   melatonin 5 MG TABS, Take 5 mg by mouth at bedtime as needed., Disp: , Rfl:    sertraline (ZOLOFT) 100 MG tablet, Take 1.5 tablets (150 mg total) by mouth daily., Disp: 45 tablet, Rfl: 2   TRELEGY ELLIPTA 200-62.5-25 MCG/INH AEPB, INHALE 1 PUFF INTO THE LUNGS DAILY (Patient not taking: Reported on 10/09/2021), Disp: 60 each, Rfl: 5  Past Medical History: Past Medical History:  Diagnosis Date   Anxiety    COPD (chronic obstructive pulmonary disease) (HCC)    Depression    Panic attacks     Tobacco Use: Social History   Tobacco Use  Smoking Status Every Day   Packs/day: 0.50   Types: Cigarettes  Smokeless  Tobacco Never  Tobacco Comments   4 cigarettes/day 07/29/21-lt    Labs: Recent Review Flowsheet Data     Labs for ITP Cardiac and Pulmonary Rehab Latest Ref Rng & Units 09/09/2018   Cholestrol 0 - 200 mg/dL 181   LDLCALC 0 - 99 mg/dL 92   HDL >40 mg/dL 72   Trlycerides <150 mg/dL 86       Capillary Blood Glucose: No results found for: GLUCAP   Pulmonary Assessment Scores:  Pulmonary Assessment Scores     Row Name 10/09/21 1144         ADL UCSD   ADL Phase Entry     SOB Score total 44       CAT Score   CAT Score 10       mMRC Score   mMRC Score 3             UCSD: Self-administered rating of dyspnea associated with activities of daily living (ADLs) 6-point scale (0 = "not at all" to 5 = "maximal or unable to do because of breathlessness")  Scoring Scores range from 0 to 120.  Minimally important difference is 5 units  CAT: CAT can identify the health impairment of COPD patients and is better correlated with disease progression.  CAT has a scoring range of zero to 40. The CAT score is classified into four groups of low (less than 10), medium (10 - 20), high (21-30) and very high (  31-40) based on the impact level of disease on health status. A CAT score over 10 suggests significant symptoms.  A worsening CAT score could be explained by an exacerbation, poor medication adherence, poor inhaler technique, or progression of COPD or comorbid conditions.  CAT MCID is 2 points  mMRC: mMRC (Modified Medical Research Council) Dyspnea Scale is used to assess the degree of baseline functional disability in patients of respiratory disease due to dyspnea. No minimal important difference is established. A decrease in score of 1 point or greater is considered a positive change.   Pulmonary Function Assessment:  Pulmonary Function Assessment - 10/09/21 1121       Breath   Bilateral Breath Sounds Decreased   Both bases   Shortness of Breath Yes;Limiting activity;Panic with  Shortness of Breath             Exercise Target Goals: Exercise Program Goal: Individual exercise prescription set using results from initial 6 min walk test and THRR while considering  patients activity barriers and safety.   Exercise Prescription Goal: Initial exercise prescription builds to 30-45 minutes a day of aerobic activity, 2-3 days per week.  Home exercise guidelines will be given to patient during program as part of exercise prescription that the participant will acknowledge.  Activity Barriers & Risk Stratification:  Activity Barriers & Cardiac Risk Stratification - 10/09/21 1113       Activity Barriers & Cardiac Risk Stratification   Activity Barriers Arthritis;Back Problems;Deconditioning;Shortness of Breath   Takes Flexeril that helps some but still has back pain with standing at long periods of time   Cardiac Risk Stratification Low             6 Minute Walk:  6 Minute Walk     Row Name 10/09/21 1221         6 Minute Walk   Phase Initial     Distance 1301 feet     Walk Time 6 minutes     # of Rest Breaks 0     MPH 2.46     METS 4.09     RPE 12     Perceived Dyspnea  1     VO2 Peak 14.32     Symptoms No     Resting HR 90 bpm     Resting BP 100/58     Resting Oxygen Saturation  92 %     Exercise Oxygen Saturation  during 6 min walk 90 %     Max Ex. HR 116 bpm     Max Ex. BP 110/60     2 Minute Post BP 100/58       Interval HR   1 Minute HR 97     2 Minute HR 98     3 Minute HR 116     4 Minute HR 112     5 Minute HR 114     6 Minute HR 111     2 Minute Post HR 86     Interval Heart Rate? Yes       Interval Oxygen   Interval Oxygen? Yes     Baseline Oxygen Saturation % 92 %     1 Minute Oxygen Saturation % 91 %     1 Minute Liters of Oxygen 0 L     2 Minute Oxygen Saturation % 94 %     2 Minute Liters of Oxygen 0 L     3 Minute Oxygen Saturation % 90 %  3 Minute Liters of Oxygen 0 L     4 Minute Oxygen Saturation % 92 %      4 Minute Liters of Oxygen 0 L     5 Minute Oxygen Saturation % 92 %     5 Minute Liters of Oxygen 0 L     6 Minute Oxygen Saturation % 93 %     6 Minute Liters of Oxygen 0 L     2 Minute Post Oxygen Saturation % 96 %     2 Minute Post Liters of Oxygen 0 L              Oxygen Initial Assessment:  Oxygen Initial Assessment - 10/09/21 1120       Home Oxygen   Home Oxygen Device None    Sleep Oxygen Prescription None    Home Exercise Oxygen Prescription None    Home Resting Oxygen Prescription None      Initial 6 min Walk   Oxygen Used None      Program Oxygen Prescription   Program Oxygen Prescription None      Intervention   Short Term Goals To learn and exhibit compliance with exercise, home and travel O2 prescription;To learn and understand importance of monitoring SPO2 with pulse oximeter and demonstrate accurate use of the pulse oximeter.;To learn and understand importance of maintaining oxygen saturations>88%;To learn and demonstrate proper pursed lip breathing techniques or other breathing techniques. ;To learn and demonstrate proper use of respiratory medications    Long  Term Goals Exhibits compliance with exercise, home  and travel O2 prescription;Verbalizes importance of monitoring SPO2 with pulse oximeter and return demonstration;Maintenance of O2 saturations>88%;Exhibits proper breathing techniques, such as pursed lip breathing or other method taught during program session;Compliance with respiratory medication;Demonstrates proper use of MDIs             Oxygen Re-Evaluation:  Oxygen Re-Evaluation     Row Name 10/30/21 0948             Program Oxygen Prescription   Program Oxygen Prescription None         Home Oxygen   Home Oxygen Device None       Sleep Oxygen Prescription None       Home Exercise Oxygen Prescription None       Home Resting Oxygen Prescription None         Goals/Expected Outcomes   Short Term Goals To learn and exhibit compliance  with exercise, home and travel O2 prescription;To learn and understand importance of monitoring SPO2 with pulse oximeter and demonstrate accurate use of the pulse oximeter.;To learn and understand importance of maintaining oxygen saturations>88%;To learn and demonstrate proper pursed lip breathing techniques or other breathing techniques. ;To learn and demonstrate proper use of respiratory medications       Long  Term Goals Exhibits compliance with exercise, home  and travel O2 prescription;Verbalizes importance of monitoring SPO2 with pulse oximeter and return demonstration;Maintenance of O2 saturations>88%;Exhibits proper breathing techniques, such as pursed lip breathing or other method taught during program session;Compliance with respiratory medication;Demonstrates proper use of MDIs       Goals/Expected Outcomes Compliance and understanding of oxygen saturation and breathing techniques to decrease shortness of breath.                Oxygen Discharge (Final Oxygen Re-Evaluation):  Oxygen Re-Evaluation - 10/30/21 0948       Program Oxygen Prescription   Program Oxygen Prescription None  Home Oxygen   Home Oxygen Device None    Sleep Oxygen Prescription None    Home Exercise Oxygen Prescription None    Home Resting Oxygen Prescription None      Goals/Expected Outcomes   Short Term Goals To learn and exhibit compliance with exercise, home and travel O2 prescription;To learn and understand importance of monitoring SPO2 with pulse oximeter and demonstrate accurate use of the pulse oximeter.;To learn and understand importance of maintaining oxygen saturations>88%;To learn and demonstrate proper pursed lip breathing techniques or other breathing techniques. ;To learn and demonstrate proper use of respiratory medications    Long  Term Goals Exhibits compliance with exercise, home  and travel O2 prescription;Verbalizes importance of monitoring SPO2 with pulse oximeter and return  demonstration;Maintenance of O2 saturations>88%;Exhibits proper breathing techniques, such as pursed lip breathing or other method taught during program session;Compliance with respiratory medication;Demonstrates proper use of MDIs    Goals/Expected Outcomes Compliance and understanding of oxygen saturation and breathing techniques to decrease shortness of breath.             Initial Exercise Prescription:  Initial Exercise Prescription - 10/09/21 1200       Date of Initial Exercise RX and Referring Provider   Date 10/09/21    Referring Provider Mannam    Expected Discharge Date 12/12/21      Recumbant Bike   Level 1.5    Watts 38    Minutes 15      Track   Minutes 15    METs 4.09      Prescription Details   Frequency (times per week) 2    Duration Progress to 30 minutes of continuous aerobic without signs/symptoms of physical distress      Intensity   Ratings of Perceived Exertion 11-13    Perceived Dyspnea 0-4      Progression   Progression Continue to progress workloads to maintain intensity without signs/symptoms of physical distress.      Resistance Training   Training Prescription Yes    Weight red bands    Reps 10-15             Perform Capillary Blood Glucose checks as needed.  Exercise Prescription Changes:   Exercise Prescription Changes     Row Name 10/15/21 1400 10/29/21 1500           Response to Exercise   Blood Pressure (Admit) 118/78 110/60      Blood Pressure (Exercise) 138/70 110/60      Blood Pressure (Exit) 103/72 106/70      Heart Rate (Admit) 95 bpm 93 bpm      Heart Rate (Exercise) 112 bpm 130 bpm      Heart Rate (Exit) 81 bpm 99 bpm      Oxygen Saturation (Admit) 96 % 93 %      Oxygen Saturation (Exercise) 94 % 93 %      Oxygen Saturation (Exit) 95 % 95 %      Rating of Perceived Exertion (Exercise) 11 12      Perceived Dyspnea (Exercise) 1 1      Duration Progress to 30 minutes of  aerobic without signs/symptoms of  physical distress Progress to 30 minutes of  aerobic without signs/symptoms of physical distress      Intensity THRR unchanged  68-135 THRR unchanged        Progression   Progression Continue to progress workloads to maintain intensity without signs/symptoms of physical distress. Continue to progress workloads to maintain  intensity without signs/symptoms of physical distress.        Resistance Training   Training Prescription Yes Yes      Weight red bands Red bands      Reps 10-15 10-15      Time 10 Minutes 10 Minutes        Recumbant Bike   Level 1.5 1.5      Minutes 15 15      METs 2.8 3        Track   Laps 16 22      Minutes 15 15      METs 2.86 --               Exercise Comments:   Exercise Comments     Row Name 10/15/21 1501           Exercise Comments Vicki Mcmillan completed her 1st day of exercise. She tolerated both exercise modes well and exercised on the [place] and track for 15 min. She averaged 2.8 METs at level 2 on the Nustep and 2.86 on the track. Vicki Mcmillan performed the warmup and cooldown standing without limitations. Will continue to monitor and progress as able.                Exercise Goals and Review:   Exercise Goals     Row Name 10/09/21 1226 10/30/21 0940           Exercise Goals   Increase Physical Activity Yes Yes      Intervention Provide advice, education, support and counseling about physical activity/exercise needs.;Develop an individualized exercise prescription for aerobic and resistive training based on initial evaluation findings, risk stratification, comorbidities and participant's personal goals. Provide advice, education, support and counseling about physical activity/exercise needs.;Develop an individualized exercise prescription for aerobic and resistive training based on initial evaluation findings, risk stratification, comorbidities and participant's personal goals.      Expected Outcomes Short Term: Attend rehab on a regular basis  to increase amount of physical activity.;Long Term: Add in home exercise to make exercise part of routine and to increase amount of physical activity.;Long Term: Exercising regularly at least 3-5 days a week. Short Term: Attend rehab on a regular basis to increase amount of physical activity.;Long Term: Add in home exercise to make exercise part of routine and to increase amount of physical activity.;Long Term: Exercising regularly at least 3-5 days a week.      Increase Strength and Stamina Yes Yes      Intervention Provide advice, education, support and counseling about physical activity/exercise needs.;Develop an individualized exercise prescription for aerobic and resistive training based on initial evaluation findings, risk stratification, comorbidities and participant's personal goals. Provide advice, education, support and counseling about physical activity/exercise needs.;Develop an individualized exercise prescription for aerobic and resistive training based on initial evaluation findings, risk stratification, comorbidities and participant's personal goals.      Expected Outcomes Short Term: Increase workloads from initial exercise prescription for resistance, speed, and METs.;Short Term: Perform resistance training exercises routinely during rehab and add in resistance training at home;Long Term: Improve cardiorespiratory fitness, muscular endurance and strength as measured by increased METs and functional capacity (6MWT) Short Term: Increase workloads from initial exercise prescription for resistance, speed, and METs.;Short Term: Perform resistance training exercises routinely during rehab and add in resistance training at home;Long Term: Improve cardiorespiratory fitness, muscular endurance and strength as measured by increased METs and functional capacity (6MWT)      Able to understand and use rate  of perceived exertion (RPE) scale Yes Yes      Intervention Provide education and explanation on how  to use RPE scale Provide education and explanation on how to use RPE scale      Expected Outcomes Short Term: Able to use RPE daily in rehab to express subjective intensity level;Long Term:  Able to use RPE to guide intensity level when exercising independently Short Term: Able to use RPE daily in rehab to express subjective intensity level;Long Term:  Able to use RPE to guide intensity level when exercising independently      Able to understand and use Dyspnea scale Yes Yes      Intervention Provide education and explanation on how to use Dyspnea scale Provide education and explanation on how to use Dyspnea scale      Expected Outcomes Short Term: Able to use Dyspnea scale daily in rehab to express subjective sense of shortness of breath during exertion;Long Term: Able to use Dyspnea scale to guide intensity level when exercising independently Short Term: Able to use Dyspnea scale daily in rehab to express subjective sense of shortness of breath during exertion;Long Term: Able to use Dyspnea scale to guide intensity level when exercising independently      Knowledge and understanding of Target Heart Rate Range (THRR) Yes Yes      Intervention Provide education and explanation of THRR including how the numbers were predicted and where they are located for reference Provide education and explanation of THRR including how the numbers were predicted and where they are located for reference      Expected Outcomes Short Term: Able to state/look up THRR;Short Term: Able to use daily as guideline for intensity in rehab;Long Term: Able to use THRR to govern intensity when exercising independently Short Term: Able to state/look up THRR;Short Term: Able to use daily as guideline for intensity in rehab;Long Term: Able to use THRR to govern intensity when exercising independently      Understanding of Exercise Prescription Yes Yes      Intervention Provide education, explanation, and written materials on patient's  individual exercise prescription Provide education, explanation, and written materials on patient's individual exercise prescription      Expected Outcomes Short Term: Able to explain program exercise prescription;Long Term: Able to explain home exercise prescription to exercise independently Short Term: Able to explain program exercise prescription;Long Term: Able to explain home exercise prescription to exercise independently               Exercise Goals Re-Evaluation :  Exercise Goals Re-Evaluation     [place] Name 10/30/21 0941             Exercise Goal Re-Evaluation   Exercise Goals Review Increase Physical Activity;Increase Strength and Stamina;Able to understand and use rate of perceived exertion (RPE) scale;Able to understand and use Dyspnea scale;Knowledge and understanding of Target Heart Rate Range (THRR);Understanding of Exercise Prescription       Comments Vicki Mcmillan has completed 4 exercise sessions. She exercises for 15 min on the recumbent bike and track. Vicki Mcmillan averages 3.0 METs at level 1.5 on the recumbent bike and 3.32 METs on the track. I have discussed pacing strategies with Vicki Mcmillan on the track so that she is progressing on the track. Since discussing pacing strategies, Vicki Mcmillan has increased her track laps from 16 to 20. She has also increased her level from 1 to 1.5 on the recumbent bike. She has tolerated these progressions well. Vicki Mcmillan performs the warmup and cooldown standing without  limitations. She is motivated to exercise and increase her functional capacity. Vicki Mcmillan seems to enjoy Pulmonary Rehab and motivation from the staff. Will continue to monitor and progress as able.       Expected Outcomes Through exercise at rehab and home, the patient will decrease shortness of breath with daily activities and feel confident in carrying out an exercise regimen at home.                Discharge Exercise Prescription (Final Exercise Prescription Changes):  Exercise Prescription  Changes - 10/29/21 1500       Response to Exercise   Blood Pressure (Admit) 110/60    Blood Pressure (Exercise) 110/60    Blood Pressure (Exit) 106/70    Heart Rate (Admit) 93 bpm    Heart Rate (Exercise) 130 bpm    Heart Rate (Exit) 99 bpm    Oxygen Saturation (Admit) 93 %    Oxygen Saturation (Exercise) 93 %    Oxygen Saturation (Exit) 95 %    Rating of Perceived Exertion (Exercise) 12    Perceived Dyspnea (Exercise) 1    Duration Progress to 30 minutes of  aerobic without signs/symptoms of physical distress    Intensity THRR unchanged      Progression   Progression Continue to progress workloads to maintain intensity without signs/symptoms of physical distress.      Resistance Training   Training Prescription Yes    Weight Red bands    Reps 10-15    Time 10 Minutes      Recumbant Bike   Level 1.5    Minutes 15    METs 3      Track   Laps 22    Minutes 15             Nutrition:  Target Goals: Understanding of nutrition guidelines, daily intake of sodium 1500mg , cholesterol 200mg , calories 30% from fat and 7% or less from saturated fats, daily to have 5 or more servings of fruits and vegetables.  Biometrics:  Pre Biometrics - 10/09/21 1115       Pre Biometrics   Grip Strength --              Nutrition Therapy Plan and Nutrition Goals:   Nutrition Assessments:  MEDIFICTS Score Key: ?70 Need to make dietary changes  40-70 Heart Healthy Diet ? 40 Therapeutic Level Cholesterol Diet   Picture Your Plate Scores: <29 Unhealthy dietary pattern with much room for improvement. 41-50 Dietary pattern unlikely to meet recommendations for good health and room for improvement. 51-60 More healthful dietary pattern, with some room for improvement.  >60 Healthy dietary pattern, although there may be some specific behaviors that could be improved.    Nutrition Goals Re-Evaluation:   Nutrition Goals Discharge (Final Nutrition Goals  Re-Evaluation):   Psychosocial: Target Goals: Acknowledge presence or absence of significant depression and/or stress, maximize coping skills, provide positive support system. Participant is able to verbalize types and ability to use techniques and skills needed for reducing stress and depression.  Initial Review & Psychosocial Screening:  Initial Psych Review & Screening - 10/09/21 1150       Initial Review   Current issues with History of Depression   Has history of panic attack. She is on medication Klonopin and Sertaline and also see a psychatrist every 3 months     Family Dynamics   Good Support System? Yes   Lives with Mother     Barriers   Psychosocial barriers  to participate in program There are no identifiable barriers or psychosocial needs.      Screening Interventions   Interventions Encouraged to exercise             Quality of Life Scores:  Scores of 19 and below usually indicate a poorer quality of life in these areas.  A difference of  2-3 points is a clinically meaningful difference.  A difference of 2-3 points in the total score of the Quality of Life Index has been associated with significant improvement in overall quality of life, self-image, physical symptoms, and general health in studies assessing change in quality of life.  PHQ-9: Recent Review Flowsheet Data     Depression screen [facility] 2/9 10/09/2021   Decreased Interest 3   Down, Depressed, Hopeless 1   PHQ - 2 Score 4   Altered sleeping 1   Tired, decreased energy 3   Change in appetite 0   Feeling bad or failure about yourself  0   Trouble concentrating 0   Moving slowly or fidgety/restless 0   Suicidal thoughts 0   PHQ-9 Score 8   Difficult doing work/chores Not difficult at all      Interpretation of Total Score  Total Score Depression Severity:  1-4 = Minimal depression, 5-9 = Mild depression, 10-14 = Moderate depression, 15-19 = Moderately severe depression, 20-27 = Severe depression    Psychosocial Evaluation and Intervention:  Psychosocial Evaluation - 10/09/21 1200       Psychosocial Evaluation & Interventions   Interventions Encouraged to exercise with the program and follow exercise prescription    Comments Vicki Mcmillan has a history of depression and panic attacks. She is on Klonopin and Zoloft and also has an appointment with her psychiatrist every 3 months.    Expected Outcomes For Vicki Mcmillan to be able to participate in PR without any psychosocial concerns or barriers    Continue Psychosocial Services  Follow up required by staff             Psychosocial Re-Evaluation:  Psychosocial Re-Evaluation     [place] Name 11/06/21 1452             Psychosocial Re-Evaluation   Current issues with History of Depression       Comments Vicki Mcmillan reports that her medication and seeing her psychiatrist keeps her depression under control. No further psychosocial barriers or issues identified.She reports that she is enjoying the PR program.She is making social connections with other members of her class.       Expected Outcomes For Vicki Mcmillan to continue to participate in PR with no psychosocial concerns or barriers.       Interventions Encouraged to attend Pulmonary Rehabilitation for the exercise       Continue Psychosocial Services  Follow up required by staff                Psychosocial Discharge (Final Psychosocial Re-Evaluation):  Psychosocial Re-Evaluation - 11/06/21 1452       Psychosocial Re-Evaluation   Current issues with History of Depression    Comments Vicki Mcmillan reports that her medication and seeing her psychiatrist keeps her depression under control. No further psychosocial barriers or issues identified.She reports that she is enjoying the PR program.She is making social connections with other members of her class.    Expected Outcomes For Vicki Mcmillan to continue to participate in PR with no psychosocial concerns or barriers.    Interventions Encouraged to attend Pulmonary  Rehabilitation for the exercise  Continue Psychosocial Services  Follow up required by staff             Education: Education Goals: Education classes will be provided on a weekly basis, covering required topics. Participant will state understanding/return demonstration of topics presented.  Learning Barriers/Preferences:   Education Topics: Risk Factor Reduction:  -Group instruction that is supported by a PowerPoint presentation. Instructor discusses the definition of a risk factor, different risk factors for pulmonary disease, and how the heart and lungs work together.     Nutrition for Pulmonary Patient:  -Group instruction provided by PowerPoint slides, verbal discussion, and written materials to support subject matter. The instructor gives an explanation and review of healthy diet recommendations, which includes a discussion on weight management, recommendations for fruit and vegetable consumption, as well as protein, fluid, caffeine, fiber, sodium, sugar, and alcohol. Tips for eating when patients are short of breath are discussed.   Pursed Lip Breathing:  -Group instruction that is supported by demonstration and informational handouts. Instructor discusses the benefits of pursed lip and diaphragmatic breathing and detailed demonstration on how to preform both.     Oxygen Safety:  -Group instruction provided by PowerPoint, verbal discussion, and written material to support subject matter. There is an overview of What is Oxygen and Why do we need it.  Instructor also reviews how to create a safe environment for oxygen use, the importance of using oxygen as prescribed, and the risks of noncompliance. There is a brief discussion on traveling with oxygen and resources the patient may utilize.   Oxygen Equipment:  -Group instruction provided by [facility] Staff utilizing handouts, written materials, and equipment demonstrations.   Signs and Symptoms:  -Group instruction  provided by written material and verbal discussion to support subject matter. Warning signs and symptoms of infection, stroke, and heart attack are reviewed and when to call the physician/911 reinforced. Tips for preventing the spread of infection discussed.   Advanced Directives:  -Group instruction provided by verbal instruction and written material to support subject matter. Instructor reviews Advanced Directive laws and proper instruction for filling out document.   Pulmonary Video:  -Group video education that reviews the importance of medication and oxygen compliance, exercise, good nutrition, pulmonary hygiene, and pursed lip and diaphragmatic breathing for the pulmonary patient.   Exercise for the Pulmonary Patient:  -Group instruction that is supported by a PowerPoint presentation. Instructor discusses benefits of exercise, core components of exercise, frequency, duration, and intensity of an exercise routine, importance of utilizing pulse oximetry during exercise, safety while exercising, and options of places to exercise outside of rehab.   Flowsheet Row PULMONARY REHAB CHRONIC OBSTRUCTIVE PULMONARY DISEASE from 10/31/2021 in [place]  Date 10/31/21  Educator Donnetta Simpers  [Handout]       Pulmonary Medications:  -Verbally interactive group education provided by instructor with focus on inhaled medications and proper administration.   Anatomy and Physiology of the Respiratory System and Intimacy:  -Group instruction provided by PowerPoint, verbal discussion, and written material to support subject matter. Instructor reviews respiratory cycle and anatomical components of the respiratory system and their functions. Instructor also reviews differences in obstructive and restrictive respiratory diseases with examples of each. Intimacy, Sex, and Sexuality differences are reviewed with a discussion on how relationships can change when diagnosed with pulmonary  disease. Common sexual concerns are reviewed.   MD DAY -A group question and answer session with a medical doctor that allows participants to ask questions that relate to  their pulmonary disease state.   OTHER EDUCATION -Group or individual verbal, written, or video instructions that support the educational goals of the pulmonary rehab program. [place] from 10/31/2021 in [place]  Date 10/29/21  Educator Deanne Coffer your your numbers handout]       Holiday Eating Survival Tips:  -Group instruction provided by PowerPoint slides, verbal discussion, and written materials to support subject matter. The instructor gives patients tips, tricks, and techniques to help them not only survive but enjoy the holidays despite the onslaught of food that accompanies the holidays.   Knowledge Questionnaire Score:  Knowledge Questionnaire Score - 10/09/21 1144       Knowledge Questionnaire Score   Pre Score 15/18             Core Components/Risk Factors/Patient Goals at Admission:  Personal Goals and Risk Factors at Admission - 10/09/21 1251       Core Components/Risk Factors/Patient Goals on Admission   Tobacco Cessation Yes    Number of packs per day .25    Intervention Assist the participant in steps to quit. Provide individualized education and counseling about committing to Tobacco Cessation, relapse prevention, and pharmacological support that can be provided by physician.;Advice worker, assist with locating and accessing local/national Quit Smoking programs, and support quit date choice.    Expected Outcomes Short Term: Will demonstrate readiness to quit, by selecting a quit date.;Short Term: Will quit all tobacco product use, adhering to prevention of relapse plan.;Long Term: Complete abstinence from all tobacco products for at least 12 months from quit date.    Improve  shortness of breath with ADL's Yes    Intervention Provide education, individualized exercise plan and daily activity instruction to help decrease symptoms of SOB with activities of daily living.    Expected Outcomes Short Term: Improve cardiorespiratory fitness to achieve a reduction of symptoms when performing ADLs;Long Term: Be able to perform more ADLs without symptoms or delay the onset of symptoms    Increase knowledge of respiratory medications and ability to use respiratory devices properly  Yes    Intervention Provide education and demonstration as needed of appropriate use of medications, inhalers, and oxygen therapy.    Expected Outcomes Short Term: Achieves understanding of medications use. Understands that oxygen is a medication prescribed by physician. Demonstrates appropriate use of inhaler and oxygen therapy.;Long Term: Maintain appropriate use of medications, inhalers, and oxygen therapy.             Core Components/Risk Factors/Patient Goals Review:   Goals and Risk Factor Review     Row Name 11/06/21 1459             Core Components/Risk Factors/Patient Goals Review   Personal Goals Review Tobacco Cessation;Improve shortness of breath with ADL's;Develop more efficient breathing techniques such as purse lipped breathing and diaphragmatic breathing and practicing self-pacing with activity.;Increase knowledge of respiratory medications and ability to use respiratory devices properly.       Review Vicki Mcmillan is progressing well with her exercise in the PR program. Her workloads and METS have been steadily increasing.She has been on the track and recumbent bike. Vicki Mcmillan has now transitioned from the track to the treadmill without difficulty.sShe reports her SOB to be mild to moderately difficult with oxygen saturations from 91 to 97%. She is exercing on room air.I encouraged her to set a quit date for smoking cessation. She did but has not been able  to keep it. She has continued to cut  back on her smoking and is now down to 1/2 of a cigarette 4 times per day. Vicki Mcmillan has tried diffient things with no help quitting. I gave her tips for quitting information and encouraged her to call the quit line. She as not utilized this, but I have again encouraged her to call them.       Expected Outcomes See admission goals. For Vicki Mcmillan to continue to progress with her exercise levels,maintaining her oxygen saturations in normal limits and on room air. Smoking cessation.                Core Components/Risk Factors/Patient Goals at Discharge (Final Review):   Goals and Risk Factor Review - 11/06/21 1459       Core Components/Risk Factors/Patient Goals Review   Personal Goals Review Tobacco Cessation;Improve shortness of breath with ADL's;Develop more efficient breathing techniques such as purse lipped breathing and diaphragmatic breathing and practicing self-pacing with activity.;Increase knowledge of respiratory medications and ability to use respiratory devices properly.    Review Vicki Mcmillan is progressing well with her exercise in the PR program. Her workloads and METS have been steadily increasing.She has been on the track and recumbent bike. Vicki Mcmillan has now transitioned from the track to the treadmill without difficulty.sShe reports her SOB to be mild to moderately difficult with oxygen saturations from 91 to 97%. She is exercing on room air.I encouraged her to set a quit date for smoking cessation. She did but has not been able to keep it. She has continued to cut back on her smoking and is now down to 1/2 of a cigarette 4 times per day. Vicki Mcmillan has tried diffient things with no help quitting. I gave her tips for quitting information and encouraged her to call the quit line. She as not utilized this, but I have again encouraged her to call them.    Expected Outcomes See admission goals. For Vicki Mcmillan to continue to progress with her exercise levels,maintaining her oxygen saturations in normal limits and on  room air. Smoking cessation.             ITP Comments:   Comments: ITP REVIEW Pt is making expected progress toward pulmonary rehab goals after completing 6 sessions. Recommend continued exercise, life style modification, education, and utilization of breathing techniques to increase stamina and strength and decrease shortness of breath with exertion.  Dr. Rodman Pickle is Medical Director for Pulmonary Rehab at [facility].

## 2021-11-07 ENCOUNTER — Encounter (HOSPITAL_COMMUNITY): Payer: Medicare HMO

## 2021-11-07 ENCOUNTER — Telehealth (HOSPITAL_COMMUNITY): Payer: Self-pay | Admitting: Family Medicine

## 2021-11-12 ENCOUNTER — Other Ambulatory Visit: Payer: Self-pay

## 2021-11-12 ENCOUNTER — Encounter (HOSPITAL_COMMUNITY)
Admission: RE | Admit: 2021-11-12 | Discharge: 2021-11-12 | Disposition: A | Discharge status: Home / Self Care | Payer: Medicare HMO | Source: Ambulatory Visit | Attending: Pulmonary Disease | Admitting: Pulmonary Disease

## 2021-11-12 VITALS — Wt 128.7 lb

## 2021-11-12 DIAGNOSIS — J449 Chronic obstructive pulmonary disease, unspecified: Secondary | ICD-10-CM

## 2021-11-12 NOTE — Progress Notes (Signed)
Home Exercise Prescription I have reviewed a Home Exercise Prescription with Vicki Mcmillan. Vicki Mcmillan is currently exercising at home. She currently walks 2 non-rehab days/wk for 30 min/day and performs the resistance band exercises. Encouraged Vicki Mcmillan to increase frequency to 3 non-rehab days/wk. She agreed with my recommendations. She walks around the store when shopping and at the park. Vicki Mcmillan is also looking into the [place] program so that she can join a fitness center. She is motivated to exercise and improve her functional capacity. Vicki Mcmillan stated that they understand the exercise prescription. The patient stated that their goals were to quit smoking and stay active. She has started using nicotine patches and decreased cigarettes smoked to 2. We reviewed exercise guidelines, target heart rate during exercise, RPE Scale, weather conditions, endpoints for exercise, warmup and cool down. The patient is encouraged to come to me with any questions. I will continue to follow up with the patient to assist them with progression and safety.    Sheppard Plumber, MS, ACSM-CEP 11/12/2021 3:06 PM

## 2021-11-12 NOTE — Progress Notes (Signed)
Daily Session Note  Patient Details  Name: Rekita Verrilli MRN: [id number] Date of Birth: 06/03/1971 Referring Provider:   Kitzia Manson Pulmonary Rehab Walk Test from 10/09/2021 in [place]  Referring Provider Mann[place]       Encounter Date: 11/12/2021  Check In:  Session Check In - 11/12/21 1425       Check-In   Supervising physician immediately available to respond to emergencies [facility] immediately available    Physician(s) Dr. Cyndia Skeeters    Location [facility]    Staff Present Rosebud Poles, RN, Quentin Ore, MS, ACSM-CEP, Exercise Physiologist;Cherise Fedder Ysidro Evert, RN    Virtual Visit No    Medication changes reported     No    Fall or balance concerns reported    No    Tobacco Cessation Use Decreased   down to 2 cigarettes/day, using replacement patches   Current number of cigarettes/nicotine per day     2    Warm-up and Cool-down Performed as group-led instruction    Resistance Training Performed Yes    VAD Patient? No    PAD/SET Patient? No      Pain Assessment   Currently in Pain? No/denies    Multiple Pain Sites No             Capillary Blood Glucose: No results found for this or any previous visit (from the past 24 hour(s)).   Exercise Prescription Changes - 11/12/21 1500       Response to Exercise   Blood Pressure (Admit) 100/56    Blood Pressure (Exercise) 134/66    Blood Pressure (Exit) 96/60    Heart Rate (Admit) 99 bpm    Heart Rate (Exercise) 117 bpm    Heart Rate (Exit) 102 bpm    Oxygen Saturation (Admit) 95 %    Oxygen Saturation (Exercise) 94 %    Oxygen Saturation (Exit) 95 %    Rating of Perceived Exertion (Exercise) 11    Perceived Dyspnea (Exercise) 1    Duration Continue with 30 min of aerobic exercise without signs/symptoms of physical distress.    Intensity THRR unchanged      Progression   Progression Continue to progress workloads to maintain intensity without signs/symptoms of  physical distress.      Resistance Training   Training Prescription Yes    Weight Blue bands    Reps 10-15    Time 10 Minutes      Treadmill   MPH 2.2    Grade 1    Minutes 15      Recumbant Bike   Level 2    Minutes 15    METs 4             Social History   Tobacco Use  Smoking Status Every Day   Packs/day: 0.50   Types: Cigarettes  Smokeless Tobacco Never  Tobacco Comments   4 cigarettes/day 07/29/21-lt    Goals Met:  Exercise tolerated well No report of concerns or symptoms today Strength training completed today  Goals Unmet:  Not Applicable  Comments: Service time is from 1320 to 1430    Dr. Rodman Pickle is Medical Director for Pulmonary Rehab at [facility].

## 2021-11-14 ENCOUNTER — Other Ambulatory Visit: Payer: Self-pay

## 2021-11-14 ENCOUNTER — Encounter (HOSPITAL_COMMUNITY)
Admission: RE | Admit: 2021-11-14 | Discharge: 2021-11-14 | Disposition: A | Discharge status: Home / Self Care | Payer: Medicare HMO | Source: Ambulatory Visit | Attending: Pulmonary Disease | Admitting: Pulmonary Disease

## 2021-11-14 DIAGNOSIS — J449 Chronic obstructive pulmonary disease, unspecified: Secondary | ICD-10-CM | POA: Diagnosis not present

## 2021-11-14 NOTE — Progress Notes (Signed)
Daily Session Note  Patient Details  Name: Vicki Mcmillan MRN: [id number] Date of Birth: [birth year] Referring Provider:   Enyah Manson Pulmonary Rehab Walk Test from 10/09/2021 in [place]  Referring Provider Mannam       Encounter Date: 11/14/2021  Check In:  Session Check In - 11/14/21 1412       Check-In   Supervising physician immediately available to respond to emergencies [facility] immediately available    Physician(s) Dr. Starla Link    Location [facility]    Staff Present Rodney Langton, Cathleen Fears, MS, ACSM-CEP, Exercise Physiologist    Virtual Visit No    Medication changes reported     No    Fall or balance concerns reported    No    Tobacco Cessation No Change    Warm-up and Cool-down Performed as group-led instruction    Resistance Training Performed Yes    VAD Patient? No    PAD/SET Patient? No      Pain Assessment   Currently in Pain? No/denies    Multiple Pain Sites No             Capillary Blood Glucose: No results found for this or any previous visit (from the past 24 hour(s)).    Social History   Tobacco Use  Smoking Status Every Day   Packs/day: 0.50   Types: Cigarettes  Smokeless Tobacco Never  Tobacco Comments   4 cigarettes/day 07/29/21-lt    Goals Met:  Exercise tolerated well No report of concerns or symptoms today Strength training completed today  Goals Unmet:  Not Applicable  Comments: Service time is from 1319 to 1430    Dr. Rodman Pickle is Medical Director for Pulmonary Rehab at [facility].

## 2021-11-19 ENCOUNTER — Encounter (HOSPITAL_COMMUNITY)
Admission: RE | Admit: 2021-11-19 | Discharge: 2021-11-19 | Disposition: A | Discharge status: Home / Self Care | Payer: Medicare HMO | Source: Ambulatory Visit | Attending: Pulmonary Disease | Admitting: Pulmonary Disease

## 2021-11-19 ENCOUNTER — Other Ambulatory Visit: Payer: Self-pay

## 2021-11-19 DIAGNOSIS — J449 Chronic obstructive pulmonary disease, unspecified: Secondary | ICD-10-CM | POA: Diagnosis not present

## 2021-11-19 NOTE — Progress Notes (Signed)
Daily Session Note  Patient Details  Name: Vicki Mcmillan MRN: [id number] Date of Birth: May 22, 1971 Referring Provider:   Shakaya Manson Pulmonary Rehab Walk Test from 10/09/2021 in [place]  Referring Provider Mannam       Encounter Date: 11/19/2021  Check In:  Session Check In - 11/19/21 1427       Check-In   Supervising physician immediately available to respond to emergencies [facility] immediately available    Physician(s) Dr. Tana Coast    [facility]    Staff Present Rodney Langton, Cathleen Fears, MS, ACSM-CEP, Exercise Physiologist    Virtual Visit No    Medication changes reported     No    Fall or balance concerns reported    No    Tobacco Cessation No Change    Warm-up and Cool-down Performed as group-led instruction    Resistance Training Performed Yes    VAD Patient? No    PAD/SET Patient? No      Pain Assessment   Currently in Pain? No/denies    Multiple Pain Sites No             Capillary Blood Glucose: No results found for this or any previous visit (from the past 24 hour(s)).    Social History   Tobacco Use  Smoking Status Every Day   Packs/day: 0.50   Types: Cigarettes  Smokeless Tobacco Never  Tobacco Comments   4 cigarettes/day 07/29/21-lt    Goals Met:  Exercise tolerated well No report of concerns or symptoms today Strength training completed today  Goals Unmet:  Not Applicable  Comments: Service time is from 1315 to 1430    Dr. Rodman Pickle is Medical Director for Pulmonary Rehab at [facility].

## 2021-11-21 ENCOUNTER — Other Ambulatory Visit: Payer: Self-pay

## 2021-11-21 ENCOUNTER — Encounter (HOSPITAL_COMMUNITY)
Admission: RE | Admit: 2021-11-21 | Discharge: 2021-11-21 | Disposition: A | Discharge status: Home / Self Care | Payer: Medicare HMO | Source: Ambulatory Visit | Attending: Pulmonary Disease | Admitting: Pulmonary Disease

## 2021-11-21 DIAGNOSIS — J449 Chronic obstructive pulmonary disease, unspecified: Secondary | ICD-10-CM

## 2021-11-21 NOTE — Progress Notes (Signed)
Daily Session Note  Patient Details  Name: Emine Tonnesen MRN: [id number] Date of Birth: [birth year] Referring Provider:   Elidia Manson Pulmonary Rehab Walk Test from 10/09/2021 in [place]  Referring Provider Mannam       Encounter Date: 11/21/2021  Check In:  Session Check In - 11/21/21 1507       Check-In   Supervising physician immediately available to respond to emergencies [facility] immediately available    Physician(s) Dr. Kurtis Bushman    [facility]    Staff Present Rosebud Poles, RN, BSN;Lisa Ysidro Evert, Felipe Drone, RN, MHA    Virtual Visit No    Medication changes reported     No    Fall or balance concerns reported    No    Tobacco Cessation No Change    Warm-up and Cool-down Performed as group-led instruction    Resistance Training Performed Yes    VAD Patient? No    PAD/SET Patient? No      Pain Assessment   Currently in Pain? No/denies    Multiple Pain Sites No             Capillary Blood Glucose: No results found for this or any previous visit (from the past 24 hour(s)).    Social History   Tobacco Use  Smoking Status Every Day   Packs/day: 0.50   Types: Cigarettes  Smokeless Tobacco Never  Tobacco Comments   4 cigarettes/day 07/29/21-lt    Goals Met:  Proper associated with RPD/PD & O2 Sat Exercise tolerated well No report of concerns or symptoms today Strength training completed today  Goals Unmet:  Not Applicable  Comments: Service time is from 1315 to 1430.    Dr. Rodman Pickle is Medical Director for Pulmonary Rehab at [facility].

## 2021-11-26 ENCOUNTER — Encounter (HOSPITAL_COMMUNITY)
Admission: RE | Admit: 2021-11-26 | Discharge: 2021-11-26 | Disposition: A | Discharge status: Home / Self Care | Payer: Medicare HMO | Source: Ambulatory Visit | Attending: Pulmonary Disease | Admitting: Pulmonary Disease

## 2021-11-26 ENCOUNTER — Other Ambulatory Visit: Payer: Self-pay

## 2021-11-26 VITALS — Wt 128.5 lb

## 2021-11-26 DIAGNOSIS — J449 Chronic obstructive pulmonary disease, unspecified: Secondary | ICD-10-CM

## 2021-11-26 NOTE — Progress Notes (Signed)
Daily Session Note  Patient Details  Name: Vicki Mcmillan MRN: [id number] Date of Birth: [birth year] Referring Provider:   Lillyanne Manson Pulmonary Rehab Walk Test from 10/09/2021 in [place]  Referring Provider Mannam       Encounter Date: 11/26/2021  Check In:  Session Check In - 11/26/21 1430       Check-In   Supervising physician immediately available to respond to emergencies [facility] immediately available    Physician(s) Dr. Kurtis Bushman    [facility]    Staff Present Rodney Langton, Cathleen Fears, MS, ACSM-CEP, Exercise Physiologist;David Lilyan Punt, MS, ACSM-CEP, CCRP, Exercise Physiologist    Virtual Visit No    Medication changes reported     No    Fall or balance concerns reported    No    Tobacco Cessation No Change    Warm-up and Cool-down Performed as group-led instruction    Resistance Training Performed Yes    VAD Patient? No    PAD/SET Patient? No      Pain Assessment   Currently in Pain? No/denies    Multiple Pain Sites No             Capillary Blood Glucose: No results found for this or any previous visit (from the past 24 hour(s)).   Exercise Prescription Changes - 11/26/21 1500       Response to Exercise   Blood Pressure (Admit) 102/72    Blood Pressure (Exercise) 126/64    Blood Pressure (Exit) 104/70    Heart Rate (Admit) 95 bpm    Heart Rate (Exercise) 127 bpm    Heart Rate (Exit) 88 bpm    Oxygen Saturation (Admit) 96 %    Oxygen Saturation (Exercise) 94 %    Oxygen Saturation (Exit) 94 %    Rating of Perceived Exertion (Exercise) 12    Perceived Dyspnea (Exercise) 1    Duration Continue with 30 min of aerobic exercise without signs/symptoms of physical distress.    Intensity THRR unchanged      Progression   Progression Continue to progress workloads to maintain intensity without signs/symptoms of physical distress.      Resistance Training   Training Prescription Yes     Weight Blue bands    Reps 10-15    Time 10 Minutes      Treadmill   MPH 2.4    Grade 2    Minutes 15      Recumbant Bike   Level 2.5    Minutes 15    METs 4.7             Social History   Tobacco Use  Smoking Status Every Day   Packs/day: 0.50   Types: Cigarettes  Smokeless Tobacco Never  Tobacco Comments   4 cigarettes/day 07/29/21-lt    Goals Met:  Exercise tolerated well No report of concerns or symptoms today Strength training completed today  Goals Unmet:  Not Applicable  Comments: Service time is from 1320 to [place]    Dr. Rodman Pickle is Medical Director for Pulmonary Rehab at [facility].

## 2021-11-28 ENCOUNTER — Encounter (HOSPITAL_COMMUNITY)
Admission: RE | Admit: 2021-11-28 | Discharge: 2021-11-28 | Disposition: A | Discharge status: Home / Self Care | Payer: Medicare HMO | Source: Ambulatory Visit | Attending: Pulmonary Disease | Admitting: Pulmonary Disease

## 2021-11-28 ENCOUNTER — Other Ambulatory Visit: Payer: Self-pay

## 2021-11-28 DIAGNOSIS — J449 Chronic obstructive pulmonary disease, unspecified: Secondary | ICD-10-CM | POA: Diagnosis not present

## 2021-11-28 NOTE — Progress Notes (Signed)
Daily Session Note  Patient Details  Name: Vicki Mcmillan MRN: [id number] Date of Birth: 11-11-1970 Referring Provider:   Mathew Manson Pulmonary Rehab Walk Test from 10/09/2021 in [place]  Referring Provider Mannam       Encounter Date: 11/28/2021  Check In:  Session Check In - 11/28/21 1514       Check-In   Supervising physician immediately available to respond to emergencies [facility] immediately available    Physician(s) Dr. Avon Gully    Location [facility]    Staff Present Elmon Else, MS, ACSM-CEP, Exercise Physiologist;Lisa Ysidro Evert, Cammie Sickle, MS, ACSM-CEP, CCRP, Exercise Physiologist    Virtual Visit No    Medication changes reported     No    Fall or balance concerns reported    No    Tobacco Cessation No Change    Warm-up and Cool-down Performed as group-led instruction    Resistance Training Performed Yes    VAD Patient? No    PAD/SET Patient? No      Pain Assessment   Currently in Pain? No/denies    Multiple Pain Sites No             Capillary Blood Glucose: No results found for this or any previous visit (from the past 24 hour(s)).    Social History   Tobacco Use  Smoking Status Every Day   Packs/day: 0.50   Types: Cigarettes  Smokeless Tobacco Never  Tobacco Comments   4 cigarettes/day 07/29/21-lt    Goals Met:  Proper associated with RPD/PD & O2 Sat [place] with exercise equipment Exercise tolerated well No report of concerns or symptoms today Strength training completed today  Goals Unmet:  Not Applicable  Comments: Service time is from 1315 to 1433.    Dr. Rodman Pickle is Medical Director for Pulmonary Rehab at [facility].

## 2021-12-03 ENCOUNTER — Other Ambulatory Visit: Payer: Self-pay

## 2021-12-03 ENCOUNTER — Encounter (HOSPITAL_COMMUNITY)
Admission: RE | Admit: 2021-12-03 | Discharge: 2021-12-03 | Disposition: A | Discharge status: Home / Self Care | Payer: Medicare HMO | Source: Ambulatory Visit | Attending: Pulmonary Disease | Admitting: Pulmonary Disease

## 2021-12-03 DIAGNOSIS — J449 Chronic obstructive pulmonary disease, unspecified: Secondary | ICD-10-CM | POA: Diagnosis not present

## 2021-12-03 NOTE — Progress Notes (Signed)
Daily Session Note  Patient Details  Name: Vicki Mcmillan MRN: [id number] Date of Birth: 11-09-1970 Referring Provider:   Ramya Manson Pulmonary Rehab Walk Test from 10/09/2021 in [place]  Referring Provider Mannam       Encounter Date: 12/03/2021  Check In:  Session Check In - 12/03/21 1505       Check-In   Supervising physician immediately available to respond to emergencies [facility] immediately available    Physician(s) Dr. Avon Gully    Location [facility]    Staff Present Rosebud Poles, RN, Deland Pretty, MS, ACSM CEP, Exercise Physiologist;Lisa Ysidro Evert, RN    Virtual Visit No    Medication changes reported     No    Fall or balance concerns reported    No    Tobacco Cessation No Change    Warm-up and Cool-down Performed as group-led instruction    Resistance Training Performed Yes    VAD Patient? No    PAD/SET Patient? No      Pain Assessment   Currently in Pain? No/denies    Multiple Pain Sites No             Capillary Blood Glucose: No results found for this or any previous visit (from the past 24 hour(s)).    Social History   Tobacco Use  Smoking Status Every Day   Packs/day: 0.50   Types: Cigarettes  Smokeless Tobacco Never  Tobacco Comments   4 cigarettes/day 07/29/21-lt    Goals Met:  Proper associated with RPD/PD & O2 Sat Exercise tolerated well No report of concerns or symptoms today Strength training completed today  Goals Unmet:  Not Applicable  Comments: Service time is from 1315 to 1437.    Dr. Rodman Pickle is Medical Director for Pulmonary Rehab at [facility].

## 2021-12-04 NOTE — Progress Notes (Addendum)
Pulmonary Individual Treatment Plan  Patient Details  Name: Vicki Mcmillan MRN: [id number] Date of Birth: Vicki Mcmillan 24, 1972 Referring Provider:   Cathyann Mcmillan Pulmonary Rehab Walk Test from 10/09/2021 in [place]  Referring Provider Mannam       Initial Encounter Date:  Flowsheet Row Pulmonary Rehab Walk Test from 10/09/2021 in [place]  Date 10/09/21       Visit Diagnosis: Stage 3 severe COPD by GOLD classification ([place])  Patient's Home Medications on Admission:   Current Outpatient Medications:    albuterol (VENTOLIN HFA) 108 (90 Base) MCG/ACT inhaler, INHALE 1 TO 2 PUFFS INTO THE LUNGS EVERY 6 HOURS AS NEEDED FOR WHEEZING OR SHORTNESS OF BREATH, Disp: 18 g, Rfl: 5   clonazePAM (KLONOPIN) 0.5 MG tablet, Take one tab as needed for panic attack, Disp: 30 tablet, Rfl: 2   cyclobenzaprine (FLEXERIL) 5 MG tablet, Take by mouth., Disp: , Rfl:    fluticasone (FLONASE) 50 MCG/ACT nasal spray, Place 2 sprays into both nostrils daily., Disp: 15.8 g, Rfl: 0   Fluticasone-Umeclidin-Vilant (TRELEGY ELLIPTA) 200-62.5-25 MCG/INH AEPB, Inhale 1 puff into the lungs daily., Disp: 60 each, Rfl: 5   ipratropium-albuterol (DUONEB) 0.5-2.5 (3) MG/3ML SOLN, Take 3 mLs by nebulization 2 (two) times daily., Disp: 360 mL, Rfl: 2   melatonin 5 MG TABS, Take 5 mg by mouth at bedtime as needed., Disp: , Rfl:    sertraline (ZOLOFT) 100 MG tablet, Take 1.5 tablets (150 mg total) by mouth daily., Disp: 45 tablet, Rfl: 2   TRELEGY ELLIPTA 200-62.5-25 MCG/INH AEPB, INHALE 1 PUFF INTO THE LUNGS DAILY (Patient not taking: Reported on 10/09/2021), Disp: 60 each, Rfl: 5  Past Medical History: Past Medical History:  Diagnosis Date   Anxiety    COPD (chronic obstructive pulmonary disease) (HCC)    Depression    Panic attacks     Tobacco Use: Social History   Tobacco Use  Smoking Status Every Day   Packs/day: 0.50   Types: Cigarettes  Smokeless  Tobacco Never  Tobacco Comments   4 cigarettes/day 07/29/21-lt    Labs: Recent Review Flowsheet Data     Labs for ITP Cardiac and Pulmonary Rehab Latest Ref Rng & Units 09/09/2018   Cholestrol 0 - 200 mg/dL 181   LDLCALC 0 - 99 mg/dL 92   HDL >40 mg/dL 72   Trlycerides <150 mg/dL 86       Capillary Blood Glucose: No results found for: GLUCAP   Pulmonary Assessment Scores:  Pulmonary Assessment Scores     Row Name 10/09/21 1144         ADL UCSD   ADL Phase Entry     SOB Score total 44       CAT Score   CAT Score 10       mMRC Score   mMRC Score 3             UCSD: Self-administered rating of dyspnea associated with activities of daily living (ADLs) 6-point scale (0 = "not at all" to 5 = "maximal or unable to do because of breathlessness")  Scoring Scores range from 0 to 120.  Minimally important difference is 5 units  CAT: CAT can identify the health impairment of COPD patients and is better correlated with disease progression.  CAT has a scoring range of zero to 40. The CAT score is classified into four groups of low (less than 10), medium (10 - 20), high (21-30) and very high (  31-40) based on the impact level of disease on health status. A CAT score over 10 suggests significant symptoms.  A worsening CAT score could be explained by an exacerbation, poor medication adherence, poor inhaler technique, or progression of COPD or comorbid conditions.  CAT MCID is 2 points  mMRC: mMRC (Modified Medical Research Council) Dyspnea Scale is used to assess the degree of baseline functional disability in patients of respiratory disease due to dyspnea. No minimal important difference is established. A decrease in score of 1 point or greater is considered a positive change.   Pulmonary Function Assessment:  Pulmonary Function Assessment - 10/09/21 1121       Breath   Bilateral Breath Sounds Decreased   Both bases   Shortness of Breath Yes;Limiting activity;Panic with  Shortness of Breath             Exercise Target Goals: Exercise Program Goal: Individual exercise prescription set using results from initial 6 min walk test and THRR while considering  patients activity barriers and safety.   Exercise Prescription Goal: Initial exercise prescription builds to 30-45 minutes a day of aerobic activity, 2-3 days per week.  Home exercise guidelines will be given to patient during program as part of exercise prescription that the participant will acknowledge.  Activity Barriers & Risk Stratification:  Activity Barriers & Cardiac Risk Stratification - 10/09/21 1113       Activity Barriers & Cardiac Risk Stratification   Activity Barriers Arthritis;Back Problems;Deconditioning;Shortness of Breath   Takes Flexeril that helps some but still has back pain with standing at long periods of time   Cardiac Risk Stratification Low             6 Minute Walk:  6 Minute Walk     Row Name 10/09/21 1221         6 Minute Walk   Phase Initial     Distance 1301 feet     Walk Time 6 minutes     # of Rest Breaks 0     MPH 2.46     METS 4.09     RPE 12     Perceived Dyspnea  1     VO2 Peak 14.32     Symptoms No     Resting HR 90 bpm     Resting BP 100/58     Resting Oxygen Saturation  92 %     Exercise Oxygen Saturation  during 6 min walk 90 %     Max Ex. HR 116 bpm     Max Ex. BP 110/60     2 Minute Post BP 100/58       Interval HR   1 Minute HR 97     2 Minute HR 98     3 Minute HR 116     4 Minute HR 112     5 Minute HR 114     6 Minute HR 111     2 Minute Post HR 86     Interval Heart Rate? Yes       Interval Oxygen   Interval Oxygen? Yes     Baseline Oxygen Saturation % 92 %     1 Minute Oxygen Saturation % 91 %     1 Minute Liters of Oxygen 0 L     2 Minute Oxygen Saturation % 94 %     2 Minute Liters of Oxygen 0 L     3 Minute Oxygen Saturation % 90 %  3 Minute Liters of Oxygen 0 L     4 Minute Oxygen Saturation % 92 %      4 Minute Liters of Oxygen 0 L     5 Minute Oxygen Saturation % 92 %     5 Minute Liters of Oxygen 0 L     6 Minute Oxygen Saturation % 93 %     6 Minute Liters of Oxygen 0 L     2 Minute Post Oxygen Saturation % 96 %     2 Minute Post Liters of Oxygen 0 L              Oxygen Initial Assessment:  Oxygen Initial Assessment - 10/09/21 1120       Home Oxygen   Home Oxygen Device None    Sleep Oxygen Prescription None    Home Exercise Oxygen Prescription None    Home Resting Oxygen Prescription None      Initial 6 min Walk   Oxygen Used None      Program Oxygen Prescription   Program Oxygen Prescription None      Intervention   Short Term Goals To learn and exhibit compliance with exercise, home and travel O2 prescription;To learn and understand importance of monitoring SPO2 with pulse oximeter and demonstrate accurate use of the pulse oximeter.;To learn and understand importance of maintaining oxygen saturations>88%;To learn and demonstrate proper pursed lip breathing techniques or other breathing techniques. ;To learn and demonstrate proper use of respiratory medications    Long  Term Goals Exhibits compliance with exercise, home  and travel O2 prescription;Verbalizes importance of monitoring SPO2 with pulse oximeter and return demonstration;Maintenance of O2 saturations>88%;Exhibits proper breathing techniques, such as pursed lip breathing or other method taught during program session;Compliance with respiratory medication;Demonstrates proper use of MDIs             Oxygen Re-Evaluation:  Oxygen Re-Evaluation     Row Name 10/30/21 0948 11/27/21 0927           Program Oxygen Prescription   Program Oxygen Prescription None None        Home Oxygen   Home Oxygen Device None None      Sleep Oxygen Prescription None None      Home Exercise Oxygen Prescription None None      Home Resting Oxygen Prescription None None        Goals/Expected Outcomes   Short Term Goals  To learn and exhibit compliance with exercise, home and travel O2 prescription;To learn and understand importance of monitoring SPO2 with pulse oximeter and demonstrate accurate use of the pulse oximeter.;To learn and understand importance of maintaining oxygen saturations>88%;To learn and demonstrate proper pursed lip breathing techniques or other breathing techniques. ;To learn and demonstrate proper use of respiratory medications To learn and exhibit compliance with exercise, home and travel O2 prescription;To learn and understand importance of monitoring SPO2 with pulse oximeter and demonstrate accurate use of the pulse oximeter.;To learn and understand importance of maintaining oxygen saturations>88%;To learn and demonstrate proper pursed lip breathing techniques or other breathing techniques. ;To learn and demonstrate proper use of respiratory medications      Long  Term Goals Exhibits compliance with exercise, home  and travel O2 prescription;Verbalizes importance of monitoring SPO2 with pulse oximeter and return demonstration;Maintenance of O2 saturations>88%;Exhibits proper breathing techniques, such as pursed lip breathing or other method taught during program session;Compliance with respiratory medication;Demonstrates proper use of MDIs Exhibits compliance with exercise, home  and travel O2 prescription;Verbalizes  importance of monitoring SPO2 with pulse oximeter and return demonstration;Maintenance of O2 saturations>88%;Exhibits proper breathing techniques, such as pursed lip breathing or other method taught during program session;Compliance with respiratory medication;Demonstrates proper use of MDIs      Goals/Expected Outcomes Compliance and understanding of oxygen saturation and breathing techniques to decrease shortness of breath. Compliance and understanding of oxygen saturation and breathing techniques to decrease shortness of breath.               Oxygen Discharge (Final Oxygen  Re-Evaluation):  Oxygen Re-Evaluation - 11/27/21 0927       Program Oxygen Prescription   Program Oxygen Prescription None      Home Oxygen   Home Oxygen Device None    Sleep Oxygen Prescription None    Home Exercise Oxygen Prescription None    Home Resting Oxygen Prescription None      Goals/Expected Outcomes   Short Term Goals To learn and exhibit compliance with exercise, home and travel O2 prescription;To learn and understand importance of monitoring SPO2 with pulse oximeter and demonstrate accurate use of the pulse oximeter.;To learn and understand importance of maintaining oxygen saturations>88%;To learn and demonstrate proper pursed lip breathing techniques or other breathing techniques. ;To learn and demonstrate proper use of respiratory medications    Long  Term Goals Exhibits compliance with exercise, home  and travel O2 prescription;Verbalizes importance of monitoring SPO2 with pulse oximeter and return demonstration;Maintenance of O2 saturations>88%;Exhibits proper breathing techniques, such as pursed lip breathing or other method taught during program session;Compliance with respiratory medication;Demonstrates proper use of MDIs    Goals/Expected Outcomes Compliance and understanding of oxygen saturation and breathing techniques to decrease shortness of breath.             Initial Exercise Prescription:  Initial Exercise Prescription - 10/09/21 1200       Date of Initial Exercise RX and Referring Provider   Date 10/09/21    Referring Provider Mannam    Expected Discharge Date 12/12/21      Recumbant Bike   Level 1.5    Watts 38    Minutes 15      Track   Minutes 15    METs 4.09      Prescription Details   Frequency (times per week) 2    Duration Progress to 30 minutes of continuous aerobic without signs/symptoms of physical distress      Intensity   Ratings of Perceived Exertion 11-13    Perceived Dyspnea 0-4      Progression   Progression Continue to  progress workloads to maintain intensity without signs/symptoms of physical distress.      Resistance Training   Training Prescription Yes    Weight red bands    Reps 10-15             Perform Capillary Blood Glucose checks as needed.  Exercise Prescription Changes:   Exercise Prescription Changes     Row Name 10/15/21 1400 10/29/21 1500 11/12/21 1400 11/12/21 1500 11/26/21 1500     Response to Exercise   Blood Pressure (Admit) 118/78 110/60 -- 100/56 102/72   Blood Pressure (Exercise) 138/70 110/60 -- 134/66 126/64   Blood Pressure (Exit) 103/72 106/70 -- 96/60 104/70   Heart Rate (Admit) 95 bpm 93 bpm -- 99 bpm 95 bpm   Heart Rate (Exercise) 112 bpm 130 bpm -- 117 bpm 127 bpm   Heart Rate (Exit) 81 bpm 99 bpm -- 102 bpm 88 bpm   Oxygen Saturation (Admit)  96 % 93 % -- 95 % 96 %   Oxygen Saturation (Exercise) 94 % 93 % -- 94 % 94 %   Oxygen Saturation (Exit) 95 % 95 % -- 95 % 94 %   Rating of Perceived Exertion (Exercise) 11 12 -- 11 12   Perceived Dyspnea (Exercise) 1 1 -- 1 1   Duration Progress to 30 minutes of  aerobic without signs/symptoms of physical distress Progress to 30 minutes of  aerobic without signs/symptoms of physical distress -- Continue with 30 min of aerobic exercise without signs/symptoms of physical distress. Continue with 30 min of aerobic exercise without signs/symptoms of physical distress.   Intensity THRR unchanged  68-135 THRR unchanged -- THRR unchanged THRR unchanged     Progression   Progression Continue to progress workloads to maintain intensity without signs/symptoms of physical distress. Continue to progress workloads to maintain intensity without signs/symptoms of physical distress. -- Continue to progress workloads to maintain intensity without signs/symptoms of physical distress. Continue to progress workloads to maintain intensity without signs/symptoms of physical distress.     Resistance Training   Training Prescription Yes Yes -- Yes  Yes   Weight red bands Red bands -- Blue bands Blue bands   Reps 10-15 10-15 -- 10-15 10-15   Time 10 Minutes 10 Minutes -- 10 Minutes 10 Minutes     Treadmill   MPH -- -- -- 2.2 2.4   Grade -- -- -- 1 2   Minutes -- -- -- 15 15     Recumbant Bike   Level 1.5 1.5 -- 2 2.5   Minutes 15 15 -- 15 15   METs 2.8 3 -- 4 4.7     Track   Laps 16 22 -- -- --   Minutes 15 15 -- -- --   METs 2.86 -- -- -- --     Home Exercise Plan   Plans to continue exercise at -- -- Home (comment)  walk around park -- --   Frequency -- -- Add 1 additional day to program exercise sessions. -- --   Initial Home Exercises Provided -- -- 11/12/21 -- --            Exercise Comments:   Exercise Comments     Row Name 10/15/21 1501 11/12/21 1452         Exercise Comments Annalyssa completed her 1st day of exercise. She tolerated both exercise modes well and exercised on the [place] and track for 15 min. She averaged 2.8 METs at level 2 on the Nustep and 2.86 on the track. Ta performed the warmup and cooldown standing without limitations. Will continue to monitor and progress as able. Completed home exercise plan. Maalle is currently walking 2 non-rehab days/wk for 30 min/day and performs the resistance band exercises. Encouraged Janiaya to increase frequency to 3 non-rehab days/wk. She agreed with my recommendations. She walks around the store when shopping and at the park. Nikita is also looking into the [place] program so that she can join a fitness center. She is motivated to exercise and improve her functional capacity.               Exercise Goals and Review:   Exercise Goals     Row Name 10/09/21 1226 10/30/21 0940 11/27/21 0922         Exercise Goals   Increase Physical Activity Yes Yes Yes     Intervention Provide advice, education, support and counseling about physical activity/exercise needs.;Develop an  individualized exercise prescription for aerobic and resistive training based  on initial evaluation findings, risk stratification, comorbidities and participant's personal goals. Provide advice, education, support and counseling about physical activity/exercise needs.;Develop an individualized exercise prescription for aerobic and resistive training based on initial evaluation findings, risk stratification, comorbidities and participant's personal goals. Provide advice, education, support and counseling about physical activity/exercise needs.;Develop an individualized exercise prescription for aerobic and resistive training based on initial evaluation findings, risk stratification, comorbidities and participant's personal goals.     Expected Outcomes Short Term: Attend rehab on a regular basis to increase amount of physical activity.;Long Term: Add in home exercise to make exercise part of routine and to increase amount of physical activity.;Long Term: Exercising regularly at least 3-5 days a week. Short Term: Attend rehab on a regular basis to increase amount of physical activity.;Long Term: Add in home exercise to make exercise part of routine and to increase amount of physical activity.;Long Term: Exercising regularly at least 3-5 days a week. Short Term: Attend rehab on a regular basis to increase amount of physical activity.;Long Term: Add in home exercise to make exercise part of routine and to increase amount of physical activity.;Long Term: Exercising regularly at least 3-5 days a week.     Increase Strength and Stamina Yes Yes Yes     Intervention Provide advice, education, support and counseling about physical activity/exercise needs.;Develop an individualized exercise prescription for aerobic and resistive training based on initial evaluation findings, risk stratification, comorbidities and participant's personal goals. Provide advice, education, support and counseling about physical activity/exercise needs.;Develop an individualized exercise prescription for aerobic and  resistive training based on initial evaluation findings, risk stratification, comorbidities and participant's personal goals. Provide advice, education, support and counseling about physical activity/exercise needs.;Develop an individualized exercise prescription for aerobic and resistive training based on initial evaluation findings, risk stratification, comorbidities and participant's personal goals.     Expected Outcomes Short Term: Increase workloads from initial exercise prescription for resistance, speed, and METs.;Short Term: Perform resistance training exercises routinely during rehab and add in resistance training at home;Long Term: Improve cardiorespiratory fitness, muscular endurance and strength as measured by increased METs and functional capacity (6MWT) Short Term: Increase workloads from initial exercise prescription for resistance, speed, and METs.;Short Term: Perform resistance training exercises routinely during rehab and add in resistance training at home;Long Term: Improve cardiorespiratory fitness, muscular endurance and strength as measured by increased METs and functional capacity (6MWT) Short Term: Increase workloads from initial exercise prescription for resistance, speed, and METs.;Short Term: Perform resistance training exercises routinely during rehab and add in resistance training at home;Long Term: Improve cardiorespiratory fitness, muscular endurance and strength as measured by increased METs and functional capacity (6MWT)     Able to understand and use rate of perceived exertion (RPE) scale Yes Yes Yes     Intervention Provide education and explanation on how to use RPE scale Provide education and explanation on how to use RPE scale Provide education and explanation on how to use RPE scale     Expected Outcomes Short Term: Able to use RPE daily in rehab to express subjective intensity level;Long Term:  Able to use RPE to guide intensity level when exercising independently Short  Term: Able to use RPE daily in rehab to express subjective intensity level;Long Term:  Able to use RPE to guide intensity level when exercising independently Short Term: Able to use RPE daily in rehab to express subjective intensity level;Long Term:  Able to use RPE to guide intensity  level when exercising independently     Able to understand and use Dyspnea scale Yes Yes Yes     Intervention Provide education and explanation on how to use Dyspnea scale Provide education and explanation on how to use Dyspnea scale Provide education and explanation on how to use Dyspnea scale     Expected Outcomes Short Term: Able to use Dyspnea scale daily in rehab to express subjective sense of shortness of breath during exertion;Long Term: Able to use Dyspnea scale to guide intensity level when exercising independently Short Term: Able to use Dyspnea scale daily in rehab to express subjective sense of shortness of breath during exertion;Long Term: Able to use Dyspnea scale to guide intensity level when exercising independently Short Term: Able to use Dyspnea scale daily in rehab to express subjective sense of shortness of breath during exertion;Long Term: Able to use Dyspnea scale to guide intensity level when exercising independently     Knowledge and understanding of Target Heart Rate Range (THRR) Yes Yes Yes     Intervention Provide education and explanation of THRR including how the numbers were predicted and where they are located for reference Provide education and explanation of THRR including how the numbers were predicted and where they are located for reference Provide education and explanation of THRR including how the numbers were predicted and where they are located for reference     Expected Outcomes Short Term: Able to state/look up THRR;Short Term: Able to use daily as guideline for intensity in rehab;Long Term: Able to use THRR to govern intensity when exercising independently Short Term: Able to state/look  up THRR;Short Term: Able to use daily as guideline for intensity in rehab;Long Term: Able to use THRR to govern intensity when exercising independently Short Term: Able to state/look up THRR;Short Term: Able to use daily as guideline for intensity in rehab;Long Term: Able to use THRR to govern intensity when exercising independently     Understanding of Exercise Prescription Yes Yes Yes     Intervention Provide education, explanation, and written materials on patient's individual exercise prescription Provide education, explanation, and written materials on patient's individual exercise prescription Provide education, explanation, and written materials on patient's individual exercise prescription     Expected Outcomes Short Term: Able to explain program exercise prescription;Long Term: Able to explain home exercise prescription to exercise independently Short Term: Able to explain program exercise prescription;Long Term: Able to explain home exercise prescription to exercise independently Short Term: Able to explain program exercise prescription;Long Term: Able to explain home exercise prescription to exercise independently              Exercise Goals Re-Evaluation :  Exercise Goals Re-Evaluation     Row Name 10/30/21 0941 11/27/21 0923           Exercise Goal Re-Evaluation   Exercise Goals Review Increase Physical Activity;Increase Strength and Stamina;Able to understand and use rate of perceived exertion (RPE) scale;Able to understand and use Dyspnea scale;Knowledge and understanding of Target Heart Rate Range (THRR);Understanding of Exercise Prescription Increase Physical Activity;Increase Strength and Stamina;Able to understand and use rate of perceived exertion (RPE) scale;Able to understand and use Dyspnea scale;Knowledge and understanding of Target Heart Rate Range (THRR);Understanding of Exercise Prescription      Comments Dyesha has completed 4 exercise sessions. She exercises for 15  min on the recumbent bike and track. Alisabeth averages 3.0 METs at level 1.5 on the recumbent bike and 3.32 METs on the track. I have discussed pacing strategies  with Danniell on the track so that she is progressing on the track. Since discussing pacing strategies, Chestine has increased her track laps from 16 to 20. She has also increased her level from 1 to 1.5 on the recumbent bike. She has tolerated these progressions well. Abygale performs the warmup and cooldown standing without limitations. She is motivated to exercise and increase her functional capacity. [place] seems to enjoy Pulmonary Rehab and motivation from the staff. Will continue to monitor and progress as able. Caylynn has completed 11 exercise sessions. She exercised for 15 min on the recumbent bike and treadmill. She averages 4.7 METs at level 2.5 on the recumbent bike and 3.5 METS at 2.4 @ 2.0% on the treadmill. She has progressed workloads for both exercise modes and has tolerated progressions well. We have an ongoing discussion about her home exercise. She has recently told me that she was able to use [employer] to join a fitness center. Marna performs the warmup and cooldown standing without limitations. She has increased the resistance of her bands. Will continue to monitor and progress as able.      Expected Outcomes Through exercise at rehab and home, the patient will decrease shortness of breath with daily activities and feel confident in carrying out an exercise regimen at home. Through exercise at rehab and home, the patient will decrease shortness of breath with daily activities and feel confident in carrying out an exercise regimen at home.               Discharge Exercise Prescription (Final Exercise Prescription Changes):  Exercise Prescription Changes - 11/26/21 1500       Response to Exercise   Blood Pressure (Admit) 102/72    Blood Pressure (Exercise) 126/64    Blood Pressure (Exit) 104/70    Heart Rate (Admit) 95 bpm     Heart Rate (Exercise) 127 bpm    Heart Rate (Exit) 88 bpm    Oxygen Saturation (Admit) 96 %    Oxygen Saturation (Exercise) 94 %    Oxygen Saturation (Exit) 94 %    Rating of Perceived Exertion (Exercise) 12    Perceived Dyspnea (Exercise) 1    Duration Continue with 30 min of aerobic exercise without signs/symptoms of physical distress.    Intensity THRR unchanged      Progression   Progression Continue to progress workloads to maintain intensity without signs/symptoms of physical distress.      Resistance Training   Training Prescription Yes    Weight Blue bands    Reps 10-15    Time 10 Minutes      Treadmill   MPH 2.4    Grade 2    Minutes 15      Recumbant Bike   Level 2.5    Minutes 15    METs 4.7             Nutrition:  Target Goals: Understanding of nutrition guidelines, daily intake of sodium 1500mg , cholesterol 200mg , calories 30% from fat and 7% or less from saturated fats, daily to have 5 or more servings of fruits and vegetables.  Biometrics:  Pre Biometrics - 10/09/21 1115       Pre Biometrics   Grip Strength --              Nutrition Therapy Plan and Nutrition Goals:   Nutrition Assessments:  MEDIFICTS Score Key: ?70 Need to make dietary changes  40-70 Heart Healthy Diet ? 40 Therapeutic Level Cholesterol Diet  Picture Your Plate Scores: <16 Unhealthy dietary pattern with much room for improvement. 41-50 Dietary pattern unlikely to meet recommendations for good health and room for improvement. 51-60 More healthful dietary pattern, with some room for improvement.  >60 Healthy dietary pattern, although there may be some specific behaviors that could be improved.    Nutrition Goals Re-Evaluation:   Nutrition Goals Discharge (Final Nutrition Goals Re-Evaluation):   Psychosocial: Target Goals: Acknowledge presence or absence of significant depression and/or stress, maximize coping skills, provide positive support system.  Participant is able to verbalize types and ability to use techniques and skills needed for reducing stress and depression.  Initial Review & Psychosocial Screening:  Initial Psych Review & Screening - 10/09/21 1150       Initial Review   Current issues with History of Depression   Has history of panic attack. She is on medication Klonopin and Sertaline and also see a psychatrist every 3 months     Family Dynamics   Good Support System? Yes   Lives with Mother     Barriers   Psychosocial barriers to participate in program There are no identifiable barriers or psychosocial needs.      Screening Interventions   Interventions Encouraged to exercise             Quality of Life Scores:  Scores of 19 and below usually indicate a poorer quality of life in these areas.  A difference of  2-3 points is a clinically meaningful difference.  A difference of 2-3 points in the total score of the Quality of Life Index has been associated with significant improvement in overall quality of life, self-image, physical symptoms, and general health in studies assessing change in quality of life.  PHQ-9: Recent Review Flowsheet Data     Depression screen [facility] 2/9 10/09/2021   Decreased Interest 3   Down, Depressed, Hopeless 1   PHQ - 2 Score 4   Altered sleeping 1   Tired, decreased energy 3   Change in appetite 0   Feeling bad or failure about yourself  0   Trouble concentrating 0   Moving slowly or fidgety/restless 0   Suicidal thoughts 0   PHQ-9 Score 8   Difficult doing work/chores Not difficult at all      Interpretation of Total Score  Total Score Depression Severity:  1-4 = Minimal depression, 5-9 = Mild depression, 10-14 = Moderate depression, 15-19 = Moderately severe depression, 20-27 = Severe depression   Psychosocial Evaluation and Intervention:  Psychosocial Evaluation - 10/09/21 1200       Psychosocial Evaluation & Interventions   Interventions Encouraged to exercise with  the program and follow exercise prescription    Comments Chassie has a history of depression and panic attacks. She is on Klonopin and Zoloft and also has an appointment with her psychiatrist every 3 months.    Expected Outcomes For Shivonne to be able to participate in PR without any psychosocial concerns or barriers    Continue Psychosocial Services  Follow up required by staff             Psychosocial Re-Evaluation:  Psychosocial Re-Evaluation     [place] Name 11/06/21 1452 12/04/21 1022           Psychosocial Re-Evaluation   Current issues with History of Depression History of Depression      Comments Kelly reports that her medication and seeing her psychiatrist keeps her depression under control. No further psychosocial barriers or issues identified.She  reports that she is enjoying the PR program.She is making social connections with other members of her class. Joby has a history of depression and is medication for this and she also has a counselor that she follows up with every 3 months. She is participating in the [place] program without any psychosocial barriers or concerns. She seems to be enjoying the program and is performing at a high level.      Expected Outcomes For Stela to continue to participate in PR with no psychosocial concerns or barriers. For Nuha to continue to participate in the PR program with no psychosocial barriers or concerns.      Interventions Encouraged to attend Pulmonary Rehabilitation for the exercise Encouraged to attend Pulmonary Rehabilitation for the exercise      Continue Psychosocial Services  Follow up required by staff Follow up required by staff               Psychosocial Discharge (Final Psychosocial Re-Evaluation):  Psychosocial Re-Evaluation - 12/04/21 1022       Psychosocial Re-Evaluation   Current issues with History of Depression    Comments Harlie has a history of depression and is medication for this and she also has a counselor that she  follows up with every 3 months. She is participating in the [place] program without any psychosocial barriers or concerns. She seems to be enjoying the program and is performing at a high level.    Expected Outcomes For Alazne to continue to participate in the PR program with no psychosocial barriers or concerns.    Interventions Encouraged to attend Pulmonary Rehabilitation for the exercise    Continue Psychosocial Services  Follow up required by staff             Education: Education Goals: Education classes will be provided on a weekly basis, covering required topics. Participant will state understanding/return demonstration of topics presented.  Learning Barriers/Preferences:   Education Topics: Risk Factor Reduction:  -Group instruction that is supported by a PowerPoint presentation. Instructor discusses the definition of a risk factor, different risk factors for pulmonary disease, and how the heart and lungs work together.     Nutrition for Pulmonary Patient:  -Group instruction provided by PowerPoint slides, verbal discussion, and written materials to support subject matter. The instructor gives an explanation and review of healthy diet recommendations, which includes a discussion on weight management, recommendations for fruit and vegetable consumption, as well as protein, fluid, caffeine, fiber, sodium, sugar, and alcohol. Tips for eating when patients are short of breath are discussed. Flowsheet Row PULMONARY REHAB CHRONIC OBSTRUCTIVE PULMONARY DISEASE from 11/28/2021 in [place]  Date 11/21/21  Educator handout       Pursed Lip Breathing:  -Group instruction that is supported by demonstration and informational handouts. Instructor discusses the benefits of pursed lip and diaphragmatic breathing and detailed demonstration on how to preform both.     Oxygen Safety:  -Group instruction provided by PowerPoint, verbal discussion, and written  material to support subject matter. There is an overview of What is Oxygen and Why do we need it.  Instructor also reviews how to create a safe environment for oxygen use, the importance of using oxygen as prescribed, and the risks of noncompliance. There is a brief discussion on traveling with oxygen and resources the patient may utilize.   Oxygen Equipment:  -Group instruction provided by [facility] Staff utilizing handouts, written materials, and equipment demonstrations.   Signs and Symptoms:  -  Group instruction provided by written material and verbal discussion to support subject matter. Warning signs and symptoms of infection, stroke, and heart attack are reviewed and when to call the physician/911 reinforced. Tips for preventing the spread of infection discussed.   Advanced Directives:  -Group instruction provided by verbal instruction and written material to support subject matter. Instructor reviews Advanced Directive laws and proper instruction for filling out document.   Pulmonary Video:  -Group video education that reviews the importance of medication and oxygen compliance, exercise, good nutrition, pulmonary hygiene, and pursed lip and diaphragmatic breathing for the pulmonary patient.   Exercise for the Pulmonary Patient:  -Group instruction that is supported by a PowerPoint presentation. Instructor discusses benefits of exercise, core components of exercise, frequency, duration, and intensity of an exercise routine, importance of utilizing pulse oximetry during exercise, safety while exercising, and options of places to exercise outside of rehab.   Flowsheet Row PULMONARY REHAB CHRONIC OBSTRUCTIVE PULMONARY DISEASE from 11/28/2021 in [place]  Date 10/31/21  Educator Donnetta Simpers  [Handout]       Pulmonary Medications:  -Verbally interactive group education provided by instructor with focus on inhaled medications and proper  administration.   Anatomy and Physiology of the Respiratory System and Intimacy:  -Group instruction provided by PowerPoint, verbal discussion, and written material to support subject matter. Instructor reviews respiratory cycle and anatomical components of the respiratory system and their functions. Instructor also reviews differences in obstructive and restrictive respiratory diseases with examples of each. Intimacy, Sex, and Sexuality differences are reviewed with a discussion on how relationships can change when diagnosed with pulmonary disease. Common sexual concerns are reviewed.   MD DAY -A group question and answer session with a medical doctor that allows participants to ask questions that relate to their pulmonary disease state.   OTHER EDUCATION -Group or individual verbal, written, or video instructions that support the educational goals of the pulmonary rehab program. [place] from 11/28/2021 in [place]  Date 11/28/21  Educator Deanne Coffer your numbers]  Instruction Review Code 1- Verbalizes Understanding       Holiday Eating Survival Tips:  -Group instruction provided by PowerPoint slides, verbal discussion, and written materials to support subject matter. The instructor gives patients tips, tricks, and techniques to help them not only survive but enjoy the holidays despite the onslaught of food that accompanies the holidays.   Knowledge Questionnaire Score:  Knowledge Questionnaire Score - 10/09/21 1144       Knowledge Questionnaire Score   Pre Score 15/18             Core Components/Risk Factors/Patient Goals at Admission:  Personal Goals and Risk Factors at Admission - 10/09/21 1251       Core Components/Risk Factors/Patient Goals on Admission   Tobacco Cessation Yes    Number of packs per day .25    Intervention Assist the participant in steps to quit. Provide  individualized education and counseling about committing to Tobacco Cessation, relapse prevention, and pharmacological support that can be provided by physician.;Advice worker, assist with locating and accessing local/national Quit Smoking programs, and support quit date choice.    Expected Outcomes Short Term: Will demonstrate readiness to quit, by selecting a quit date.;Short Term: Will quit all tobacco product use, adhering to prevention of relapse plan.;Long Term: Complete abstinence from all tobacco products for at least 12 months from quit date.  Improve shortness of breath with ADL's Yes    Intervention Provide education, individualized exercise plan and daily activity instruction to help decrease symptoms of SOB with activities of daily living.    Expected Outcomes Short Term: Improve cardiorespiratory fitness to achieve a reduction of symptoms when performing ADLs;Long Term: Be able to perform more ADLs without symptoms or delay the onset of symptoms    Increase knowledge of respiratory medications and ability to use respiratory devices properly  Yes    Intervention Provide education and demonstration as needed of appropriate use of medications, inhalers, and oxygen therapy.    Expected Outcomes Short Term: Achieves understanding of medications use. Understands that oxygen is a medication prescribed by physician. Demonstrates appropriate use of inhaler and oxygen therapy.;Long Term: Maintain appropriate use of medications, inhalers, and oxygen therapy.             Core Components/Risk Factors/Patient Goals Review:   Goals and Risk Factor Review     Row Name 11/06/21 1459 12/04/21 1032           Core Components/Risk Factors/Patient Goals Review   Personal Goals Review Tobacco Cessation;Improve shortness of breath with ADL's;Develop more efficient breathing techniques such as purse lipped breathing and diaphragmatic breathing and practicing self-pacing with  activity.;Increase knowledge of respiratory medications and ability to use respiratory devices properly. Tobacco Cessation;Improve shortness of breath with ADL's;Develop more efficient breathing techniques such as purse lipped breathing and diaphragmatic breathing and practicing self-pacing with activity.;Increase knowledge of respiratory medications and ability to use respiratory devices properly.      Review Virdell is progressing well with her exercise in the PR program. Her workloads and METS have been steadily increasing.She has been on the track and recumbent bike. Harold has now transitioned from the track to the treadmill without difficulty.sShe reports her SOB to be mild to moderately difficult with oxygen saturations from 91 to 97%. She is exercing on room air.I encouraged her to set a quit date for smoking cessation. She did but has not been able to keep it. She has continued to cut back on her smoking and is now down to 1/2 of a cigarette 4 times per day. Anaissa has tried diffient things with no help quitting. I gave her tips for quitting information and encouraged her to call the quit line. She as not utilized this, but I have again encouraged her to call them. Aveleen has struggled to completely stop smoking. She established a quit date but, has had some issues sticking 100% to it.We have continued to encouage her quit completely. I gave her information on the 1-800- quit line to call for a coach. She has yet to use this resource. Judeen is smoking very little to none some days.She is exercing on the recumbent bike and the treadmill.She is on level 3.5 on the recumbent bike and on the TM speed of 2.4 with incline of 2.0. She is reporting only mild SOB.She has been progressing well with exercise and METS.She is on room air with her oxygen saturations being 93-95%.      Expected Outcomes See admission goals. For Matasha to continue to progress with her exercise levels,maintaining her oxygen saturations in  normal limits and on room air. Smoking cessation. For Farrah to continue to perform well on the exercise equipment and for complete smoking cessation.               Core Components/Risk Factors/Patient Goals at Discharge (Final Review):   Goals and Risk Factor Review -  12/04/21 1032       Core Components/Risk Factors/Patient Goals Review   Personal Goals Review Tobacco Cessation;Improve shortness of breath with ADL's;Develop more efficient breathing techniques such as purse lipped breathing and diaphragmatic breathing and practicing self-pacing with activity.;Increase knowledge of respiratory medications and ability to use respiratory devices properly.    Review Ardyth has struggled to completely stop smoking. She established a quit date but, has had some issues sticking 100% to it.We have continued to encouage her quit completely. I gave her information on the 1-800- quit line to call for a coach. She has yet to use this resource. Panagiota is smoking very little to none some days.She is exercing on the recumbent bike and the treadmill.She is on level 3.5 on the recumbent bike and on the TM speed of 2.4 with incline of 2.0. She is reporting only mild SOB.She has been progressing well with exercise and METS.She is on room air with her oxygen saturations being 93-95%.    Expected Outcomes For Karolynn to continue to perform well on the exercise equipment and for complete smoking cessation.             ITP Comments:   Comments: Dr. Rodman Pickle is Medical Director for Pulmonary Rehab at [facility].

## 2021-12-05 ENCOUNTER — Encounter (HOSPITAL_COMMUNITY)
Admission: RE | Admit: 2021-12-05 | Discharge: 2021-12-05 | Disposition: A | Discharge status: Home / Self Care | Payer: Medicare HMO | Source: Ambulatory Visit | Attending: Pulmonary Disease | Admitting: Pulmonary Disease

## 2021-12-05 ENCOUNTER — Other Ambulatory Visit (HOSPITAL_COMMUNITY): Payer: Self-pay | Admitting: Psychiatry

## 2021-12-05 ENCOUNTER — Other Ambulatory Visit: Payer: Self-pay

## 2021-12-05 DIAGNOSIS — J449 Chronic obstructive pulmonary disease, unspecified: Secondary | ICD-10-CM | POA: Insufficient documentation

## 2021-12-05 DIAGNOSIS — F419 Anxiety disorder, unspecified: Secondary | ICD-10-CM

## 2021-12-05 DIAGNOSIS — F331 Major depressive disorder, recurrent, moderate: Secondary | ICD-10-CM

## 2021-12-05 NOTE — Progress Notes (Signed)
Daily Session Note  Patient Details  Name: Vicki Mcmillan MRN: [id number] Date of Birth: 01-04-1971 Referring Provider:   Malon Manson Pulmonary Rehab Walk Test from 10/09/2021 in [place]  Referring Provider Mannam       Encounter Date: 12/05/2021  Check In:  Session Check In - 12/05/21 1606       Check-In   Supervising physician immediately available to respond to emergencies [facility] immediately available    Physician(s) Dr. Doristine Bosworth    Location [facility]    Staff Present Lesly Rubenstein, MS, ACSM-CEP, CCRP, Exercise Physiologist;Annedrea Rosezella [place], RN, MHA;Bao Coreas Ysidro Evert, RN    Virtual Visit No    Medication changes reported     No    Fall or balance concerns reported    No    Tobacco Cessation No Change    Warm-up and Cool-down Performed as group-led instruction    Resistance Training Performed Yes    VAD Patient? No    PAD/SET Patient? No      Pain Assessment   Currently in Pain? No/denies    Multiple Pain Sites No             Capillary Blood Glucose: No results found for this or any previous visit (from the past 24 hour(s)).    Social History   Tobacco Use  Smoking Status Every Day   Packs/day: 0.50   Types: Cigarettes  Smokeless Tobacco Never  Tobacco Comments   4 cigarettes/day 07/29/21-lt    Goals Met:  Exercise tolerated well No report of concerns or symptoms today Strength training completed today  Goals Unmet:  Not Applicable  Comments: Service time is from 1320 to 1450    Dr. Rodman Pickle is Medical Director for Pulmonary Rehab at [facility].

## 2021-12-09 ENCOUNTER — Other Ambulatory Visit (HOSPITAL_COMMUNITY): Payer: Self-pay | Admitting: Psychiatry

## 2021-12-09 DIAGNOSIS — F419 Anxiety disorder, unspecified: Secondary | ICD-10-CM

## 2021-12-10 ENCOUNTER — Encounter (HOSPITAL_COMMUNITY)
Admission: RE | Admit: 2021-12-10 | Discharge: 2021-12-10 | Disposition: A | Discharge status: Home / Self Care | Payer: Medicare HMO | Source: Ambulatory Visit | Attending: Pulmonary Disease | Admitting: Pulmonary Disease

## 2021-12-10 ENCOUNTER — Other Ambulatory Visit: Payer: Self-pay

## 2021-12-10 VITALS — Wt 129.4 lb

## 2021-12-10 DIAGNOSIS — J449 Chronic obstructive pulmonary disease, unspecified: Secondary | ICD-10-CM | POA: Diagnosis not present

## 2021-12-10 NOTE — Progress Notes (Signed)
Daily Session Note  Patient Details  Name: Vicki Mcmillan MRN: [id number] Date of Birth: Mar 24, 1971 Referring Provider:   Alajiah Manson Pulmonary Rehab Walk Test from 10/09/2021 in [place]  Referring Provider Mannam       Encounter Date: 12/10/2021  Check In:  Session Check In - 12/10/21 1510       Check-In   Supervising physician immediately available to respond to emergencies [facility] immediately available    Physician(s) Dr. Doristine Bosworth    Location [facility]    Staff Present Elmon Else, MS, ACSM-CEP, Exercise Physiologist;Lisa Vicente Serene, MS, ACSM CEP, Exercise Physiologist    Virtual Visit No    Medication changes reported     No    Fall or balance concerns reported    No    Tobacco Cessation No Change    Warm-up and Cool-down Performed as group-led instruction    Resistance Training Performed Yes    VAD Patient? No    PAD/SET Patient? No      Pain Assessment   Currently in Pain? No/denies    Multiple Pain Sites No             Capillary Blood Glucose: No results found for this or any previous visit (from the past 24 hour(s)).   Exercise Prescription Changes - 12/10/21 1500       Response to Exercise   Blood Pressure (Admit) 104/62    Blood Pressure (Exercise) 126/68    Blood Pressure (Exit) 106/78    Heart Rate (Admit) 88 bpm    Heart Rate (Exercise) 126 bpm    Heart Rate (Exit) 77 bpm    Oxygen Saturation (Admit) 98 %    Oxygen Saturation (Exercise) 95 %    Oxygen Saturation (Exit) 95 %    Rating of Perceived Exertion (Exercise) 11    Perceived Dyspnea (Exercise) 1    Duration Continue with 30 min of aerobic exercise without signs/symptoms of physical distress.    Intensity THRR unchanged      Progression   Progression Continue to progress workloads to maintain intensity without signs/symptoms of physical distress.      Resistance Training   Training Prescription Yes     Weight Blue bands    Reps 10-15    Time 10 Minutes      Treadmill   MPH 2.5    Grade 3    Minutes 15    METs 3.95      Recumbant Bike   Level 4    Minutes 15    METs 5.7             Social History   Tobacco Use  Smoking Status Every Day   Packs/day: 0.50   Types: Cigarettes  Smokeless Tobacco Never  Tobacco Comments   4 cigarettes/day 07/29/21-lt    Goals Met:  Proper associated with RPD/PD & O2 Sat [place] with exercise equipment Exercise tolerated well No report of concerns or symptoms today Strength training completed today  Goals Unmet:  Not Applicable  Comments: Service time is from 1315 to 1435.    Dr. Rodman Pickle is Medical Director for Pulmonary Rehab at [facility].

## 2021-12-12 ENCOUNTER — Other Ambulatory Visit: Payer: Self-pay

## 2021-12-12 ENCOUNTER — Encounter (HOSPITAL_COMMUNITY)
Admission: RE | Admit: 2021-12-12 | Discharge: 2021-12-12 | Disposition: A | Discharge status: Home / Self Care | Payer: Medicare HMO | Source: Ambulatory Visit | Attending: Pulmonary Disease | Admitting: Pulmonary Disease

## 2021-12-12 VITALS — Wt 129.6 lb

## 2021-12-12 DIAGNOSIS — J449 Chronic obstructive pulmonary disease, unspecified: Secondary | ICD-10-CM | POA: Diagnosis not present

## 2021-12-12 NOTE — Progress Notes (Signed)
Daily Session Note  Patient Details  Name: Vicki Mcmillan MRN: [id number] Date of Birth: 12-18-1970 Referring Provider:   Alylah Manson Pulmonary Rehab Walk Test from 10/09/2021 in [place]  Referring Provider Mannam       Encounter Date: 12/12/2021  Check In:  Session Check In - 12/12/21 1435       Check-In   Supervising physician immediately available to respond to emergencies [facility] immediately available    Physician(s) Dr. Eliseo Squires    Location [facility]    Staff Present Elmon Else, MS, ACSM-CEP, Exercise Physiologist;Jetta Gilford Rile BS, ACSM EP-C, Exercise Physiologist;Lisa Ysidro Evert, RN    Virtual Visit No    Medication changes reported     No    Fall or balance concerns reported    No    Tobacco Cessation No Change    Warm-up and Cool-down Performed as group-led instruction    Resistance Training Performed Yes    VAD Patient? No    PAD/SET Patient? No      Pain Assessment   Currently in Pain? No/denies    Multiple Pain Sites No             Capillary Blood Glucose: No results found for this or any previous visit (from the past 24 hour(s)).    Social History   Tobacco Use  Smoking Status Every Day   Packs/day: 0.50   Types: Cigarettes  Smokeless Tobacco Never  Tobacco Comments   4 cigarettes/day 07/29/21-lt    Goals Met:  Proper associated with RPD/PD & O2 Sat [place] with exercise equipment Exercise tolerated well No report of concerns or symptoms today Strength training completed today  Goals Unmet:  Not Applicable  Comments: Service time is from 1321 to 1444.    Dr. Rodman Pickle is Medical Director for Pulmonary Rehab at [facility].

## 2021-12-12 NOTE — Progress Notes (Signed)
Discharge Progress Report  Patient Details  Name: Vicki Mcmillan MRN: [id number] Date of Birth: 23-Nov-1970 Referring Provider:   Victorina Manson [facility] Walk Test from 10/09/2021 in [place]  Referring Provider Mannam        Number of Visits: 16  Reason for Discharge:  Patient has met program and personal goals.  Smoking History:  Social History   Tobacco Use  Smoking Status Every Day   Packs/day: 0.50   Types: Cigarettes  Smokeless Tobacco Never  Tobacco Comments   4 cigarettes/day 07/29/21-lt    Diagnosis:  Stage 3 severe COPD by GOLD classification ([place])  ADL [facility]:  Pulmonary Assessment Scores     Row Name 10/09/21 1144 12/10/21 0934       ADL [facility]   ADL Phase Entry Exit    SOB Score total 44 37      CAT Score   CAT Score 10 9      mMRC Score   mMRC Score 3 --             Initial Exercise Prescription:  Initial Exercise Prescription - 10/09/21 1200       Date of Initial Exercise RX and Referring Provider   Date 10/09/21    Referring Provider Mannam    Expected Discharge Date 12/12/21      Recumbant Bike   Level 1.5    Watts 38    Minutes 15      Track   Minutes 15    METs 4.09      Prescription Details   Frequency (times per week) 2    Duration Progress to 30 minutes of continuous aerobic without signs/symptoms of physical distress      Intensity   Ratings of Perceived Exertion 11-13    Perceived Dyspnea 0-4      Progression   Progression Continue to progress workloads to maintain intensity without signs/symptoms of physical distress.      Resistance Training   Training Prescription Yes    Weight red bands    Reps 10-15             Discharge Exercise Prescription (Final Exercise Prescription Changes):  Exercise Prescription Changes - 12/10/21 1500       Response to Exercise   Blood Pressure (Admit) 104/62    Blood Pressure (Exercise) 126/68    Blood Pressure (Exit) 106/78     Heart Rate (Admit) 88 bpm    Heart Rate (Exercise) 126 bpm    Heart Rate (Exit) 77 bpm    Oxygen Saturation (Admit) 98 %    Oxygen Saturation (Exercise) 95 %    Oxygen Saturation (Exit) 95 %    Rating of Perceived Exertion (Exercise) 11    Perceived Dyspnea (Exercise) 1    Duration Continue with 30 min of aerobic exercise without signs/symptoms of physical distress.    Intensity THRR unchanged      Progression   Progression Continue to progress workloads to maintain intensity without signs/symptoms of physical distress.      Resistance Training   Training Prescription Yes    Weight Blue bands    Reps 10-15    Time 10 Minutes      Treadmill   MPH 2.5    Grade 3    Minutes 15    METs 3.95      Recumbant Bike   Level 4    Minutes 15    METs 5.7  Functional Capacity:  6 Minute Walk     Row Name 10/09/21 1221 12/12/21 1505       6 Minute Walk   Phase Initial Discharge    Distance 1301 feet 2061 feet    Distance % Change -- 58.42 %    Distance Feet Change -- 760 ft    Walk Time 6 minutes 6 minutes    # of Rest Breaks 0 0    MPH 2.46 3.9    METS 4.09 5.57    RPE 12 11    Perceived Dyspnea  1 1    VO2 Peak 14.32 19.49    Symptoms No No    Resting HR 90 bpm 98 bpm    Resting BP 100/58 110/76    Resting Oxygen Saturation  92 % 94 %    Exercise Oxygen Saturation  during 6 min walk 90 % 93 %    Max Ex. HR 116 bpm 135 bpm    Max Ex. BP 110/60 112/68    2 Minute Post BP 100/58 104/64      Interval HR   1 Minute HR 97 132    2 Minute HR 98 132    3 Minute HR 116 130    4 Minute HR 112 312    5 Minute HR 114 135    6 Minute HR 111 135    2 Minute Post HR 86 102    Interval Heart Rate? Yes Yes      Interval Oxygen   Interval Oxygen? Yes Yes    Baseline Oxygen Saturation % 92 % 94 %    1 Minute Oxygen Saturation % 91 % 95 %    1 Minute Liters of Oxygen 0 L 0 L    2 Minute Oxygen Saturation % 94 % 94 %    2 Minute Liters of Oxygen 0 L 0 L    3  Minute Oxygen Saturation % 90 % 94 %    3 Minute Liters of Oxygen 0 L 0 L    4 Minute Oxygen Saturation % 92 % 93 %    4 Minute Liters of Oxygen 0 L 0 L    5 Minute Oxygen Saturation % 92 % 93 %    5 Minute Liters of Oxygen 0 L 0 L    6 Minute Oxygen Saturation % 93 % 94 %    6 Minute Liters of Oxygen 0 L 0 L    2 Minute Post Oxygen Saturation % 96 % 98 %    2 Minute Post Liters of Oxygen 0 L 0 L             Psychological, QOL, Others - Outcomes: PHQ 2/9: Depression screen [facility] 2/9 12/10/2021 10/09/2021  Decreased Interest 0 3  Down, Depressed, Hopeless 0 1  PHQ - 2 Score 0 4  Altered sleeping 0 1  Tired, decreased energy 0 3  Change in appetite 0 0  Feeling bad or failure about yourself  0 0  Trouble concentrating 0 0  Moving slowly or fidgety/restless 0 0  Suicidal thoughts 0 0  PHQ-9 Score 0 8  Difficult doing work/chores Not difficult at all Not difficult at all    Quality of Life:   Personal Goals: Goals established at orientation with interventions provided to work toward goal.  Personal Goals and Risk Factors at Admission - 10/09/21 1251       Core Components/Risk Factors/Patient Goals on Admission   Tobacco Cessation  Yes    Number of packs per day .25    Intervention Assist the participant in steps to quit. Provide individualized education and counseling about committing to Tobacco Cessation, relapse prevention, and pharmacological support that can be provided by physician.;Advice worker, assist with locating and accessing local/national Quit Smoking programs, and support quit date choice.    Expected Outcomes Short Term: Will demonstrate readiness to quit, by selecting a quit date.;Short Term: Will quit all tobacco product use, adhering to prevention of relapse plan.;Long Term: Complete abstinence from all tobacco products for at least 12 months from quit date.    Improve shortness of breath with ADL's Yes    Intervention Provide education,  individualized exercise plan and daily activity instruction to help decrease symptoms of SOB with activities of daily living.    Expected Outcomes Short Term: Improve cardiorespiratory fitness to achieve a reduction of symptoms when performing ADLs;Long Term: Be able to perform more ADLs without symptoms or delay the onset of symptoms    Increase knowledge of respiratory medications and ability to use respiratory devices properly  Yes    Intervention Provide education and demonstration as needed of appropriate use of medications, inhalers, and oxygen therapy.    Expected Outcomes Short Term: Achieves understanding of medications use. Understands that oxygen is a medication prescribed by physician. Demonstrates appropriate use of inhaler and oxygen therapy.;Long Term: Maintain appropriate use of medications, inhalers, and oxygen therapy.              Personal Goals Discharge:  Goals and Risk Factor Review     Row Name 11/06/21 1459 12/04/21 1032           Core Components/Risk Factors/Patient Goals Review   Personal Goals Review Tobacco Cessation;Improve shortness of breath with ADL's;Develop more efficient breathing techniques such as purse lipped breathing and diaphragmatic breathing and practicing self-pacing with activity.;Increase knowledge of respiratory medications and ability to use respiratory devices properly. Tobacco Cessation;Improve shortness of breath with ADL's;Develop more efficient breathing techniques such as purse lipped breathing and diaphragmatic breathing and practicing self-pacing with activity.;Increase knowledge of respiratory medications and ability to use respiratory devices properly.      Review Vicki Mcmillan is progressing well with her exercise in the PR program. Her workloads and METS have been steadily increasing.She has been on the track and recumbent bike. Vicki Mcmillan has now transitioned from the track to the treadmill without difficulty.sShe reports her SOB to be mild to  moderately difficult with oxygen saturations from 91 to 97%. She is exercing on room air.I encouraged her to set a quit date for smoking cessation. She did but has not been able to keep it. She has continued to cut back on her smoking and is now down to 1/2 of a cigarette 4 times per day. Vicki Mcmillan has tried diffient things with no help quitting. I gave her tips for quitting information and encouraged her to call the quit line. She as not utilized this, but I have again encouraged her to call them. Vicki Mcmillan has struggled to completely stop smoking. She established a quit date but, has had some issues sticking 100% to it.We have continued to encouage her quit completely. I gave her information on the 1-800- quit line to call for a coach. She has yet to use this resource. Vicki Mcmillan is smoking very little to none some days.She is exercing on the recumbent bike and the treadmill.She is on level 3.5 on the recumbent bike and on the TM speed of 2.4 with  incline of 2.0. She is reporting only mild SOB.She has been progressing well with exercise and METS.She is on room air with her oxygen saturations being 93-95%.      Expected Outcomes See admission goals. For Vicki Mcmillan to continue to progress with her exercise levels,maintaining her oxygen saturations in normal limits and on room air. Smoking cessation. For Vicki Mcmillan to continue to perform well on the exercise equipment and for complete smoking cessation.               Exercise Goals and Review:  Exercise Goals     Row Name 10/09/21 1226 10/30/21 0940 11/27/21 0922         Exercise Goals   Increase Physical Activity Yes Yes Yes     Intervention Provide advice, education, support and counseling about physical activity/exercise needs.;Develop an individualized exercise prescription for aerobic and resistive training based on initial evaluation findings, risk stratification, comorbidities and participant's personal goals. Provide advice, education, support and counseling about  physical activity/exercise needs.;Develop an individualized exercise prescription for aerobic and resistive training based on initial evaluation findings, risk stratification, comorbidities and participant's personal goals. Provide advice, education, support and counseling about physical activity/exercise needs.;Develop an individualized exercise prescription for aerobic and resistive training based on initial evaluation findings, risk stratification, comorbidities and participant's personal goals.     Expected Outcomes Short Term: Attend rehab on a regular basis to increase amount of physical activity.;Long Term: Add in home exercise to make exercise part of routine and to increase amount of physical activity.;Long Term: Exercising regularly at least 3-5 days a week. Short Term: Attend rehab on a regular basis to increase amount of physical activity.;Long Term: Add in home exercise to make exercise part of routine and to increase amount of physical activity.;Long Term: Exercising regularly at least 3-5 days a week. Short Term: Attend rehab on a regular basis to increase amount of physical activity.;Long Term: Add in home exercise to make exercise part of routine and to increase amount of physical activity.;Long Term: Exercising regularly at least 3-5 days a week.     Increase Strength and Stamina Yes Yes Yes     Intervention Provide advice, education, support and counseling about physical activity/exercise needs.;Develop an individualized exercise prescription for aerobic and resistive training based on initial evaluation findings, risk stratification, comorbidities and participant's personal goals. Provide advice, education, support and counseling about physical activity/exercise needs.;Develop an individualized exercise prescription for aerobic and resistive training based on initial evaluation findings, risk stratification, comorbidities and participant's personal goals. Provide advice, education, support and  counseling about physical activity/exercise needs.;Develop an individualized exercise prescription for aerobic and resistive training based on initial evaluation findings, risk stratification, comorbidities and participant's personal goals.     Expected Outcomes Short Term: Increase workloads from initial exercise prescription for resistance, speed, and METs.;Short Term: Perform resistance training exercises routinely during rehab and add in resistance training at home;Long Term: Improve cardiorespiratory fitness, muscular endurance and strength as measured by increased METs and functional capacity (6MWT) Short Term: Increase workloads from initial exercise prescription for resistance, speed, and METs.;Short Term: Perform resistance training exercises routinely during rehab and add in resistance training at home;Long Term: Improve cardiorespiratory fitness, muscular endurance and strength as measured by increased METs and functional capacity (6MWT) Short Term: Increase workloads from initial exercise prescription for resistance, speed, and METs.;Short Term: Perform resistance training exercises routinely during rehab and add in resistance training at home;Long Term: Improve cardiorespiratory fitness, muscular endurance and strength as measured by increased  METs and functional capacity (6MWT)     Able to understand and use rate of perceived exertion (RPE) scale Yes Yes Yes     Intervention Provide education and explanation on how to use RPE scale Provide education and explanation on how to use RPE scale Provide education and explanation on how to use RPE scale     Expected Outcomes Short Term: Able to use RPE daily in rehab to express subjective intensity level;Long Term:  Able to use RPE to guide intensity level when exercising independently Short Term: Able to use RPE daily in rehab to express subjective intensity level;Long Term:  Able to use RPE to guide intensity level when exercising independently Short  Term: Able to use RPE daily in rehab to express subjective intensity level;Long Term:  Able to use RPE to guide intensity level when exercising independently     Able to understand and use Dyspnea scale Yes Yes Yes     Intervention Provide education and explanation on how to use Dyspnea scale Provide education and explanation on how to use Dyspnea scale Provide education and explanation on how to use Dyspnea scale     Expected Outcomes Short Term: Able to use Dyspnea scale daily in rehab to express subjective sense of shortness of breath during exertion;Long Term: Able to use Dyspnea scale to guide intensity level when exercising independently Short Term: Able to use Dyspnea scale daily in rehab to express subjective sense of shortness of breath during exertion;Long Term: Able to use Dyspnea scale to guide intensity level when exercising independently Short Term: Able to use Dyspnea scale daily in rehab to express subjective sense of shortness of breath during exertion;Long Term: Able to use Dyspnea scale to guide intensity level when exercising independently     Knowledge and understanding of Target Heart Rate Range (THRR) Yes Yes Yes     Intervention Provide education and explanation of THRR including how the numbers were predicted and where they are located for reference Provide education and explanation of THRR including how the numbers were predicted and where they are located for reference Provide education and explanation of THRR including how the numbers were predicted and where they are located for reference     Expected Outcomes Short Term: Able to state/look up THRR;Short Term: Able to use daily as guideline for intensity in rehab;Long Term: Able to use THRR to govern intensity when exercising independently Short Term: Able to state/look up THRR;Short Term: Able to use daily as guideline for intensity in rehab;Long Term: Able to use THRR to govern intensity when exercising independently Short Term:  Able to state/look up THRR;Short Term: Able to use daily as guideline for intensity in rehab;Long Term: Able to use THRR to govern intensity when exercising independently     Understanding of Exercise Prescription Yes Yes Yes     Intervention Provide education, explanation, and written materials on patient's individual exercise prescription Provide education, explanation, and written materials on patient's individual exercise prescription Provide education, explanation, and written materials on patient's individual exercise prescription     Expected Outcomes Short Term: Able to explain program exercise prescription;Long Term: Able to explain home exercise prescription to exercise independently Short Term: Able to explain program exercise prescription;Long Term: Able to explain home exercise prescription to exercise independently Short Term: Able to explain program exercise prescription;Long Term: Able to explain home exercise prescription to exercise independently              Exercise Goals Re-Evaluation:  Exercise Goals  Re-Evaluation     Row Name 10/30/21 0941 11/27/21 0923           Exercise Goal Re-Evaluation   Exercise Goals Review Increase Physical Activity;Increase Strength and Stamina;Able to understand and use rate of perceived exertion (RPE) scale;Able to understand and use Dyspnea scale;Knowledge and understanding of Target Heart Rate Range (THRR);Understanding of Exercise Prescription Increase Physical Activity;Increase Strength and Stamina;Able to understand and use rate of perceived exertion (RPE) scale;Able to understand and use Dyspnea scale;Knowledge and understanding of Target Heart Rate Range (THRR);Understanding of Exercise Prescription      Comments Vicki Mcmillan has completed 4 exercise sessions. She exercises for 15 min on the recumbent bike and track. Vicki Mcmillan averages 3.0 METs at level 1.5 on the recumbent bike and 3.32 METs on the track. I have discussed pacing strategies with  Vicki Mcmillan on the track so that she is progressing on the track. Since discussing pacing strategies, Vicki Mcmillan has increased her track laps from 16 to 20. She has also increased her level from 1 to 1.5 on the recumbent bike. She has tolerated these progressions well. Vicki Mcmillan performs the warmup and cooldown standing without limitations. She is motivated to exercise and increase her functional capacity. Vicki Mcmillan seems to enjoy Pulmonary Rehab and motivation from the staff. Will continue to monitor and progress as able. Vicki Mcmillan has completed 11 exercise sessions. She exercised for 15 min on the recumbent bike and treadmill. She averages 4.7 METs at level 2.5 on the recumbent bike and 3.5 METS at 2.4 @ 2.0% on the treadmill. She has progressed workloads for both exercise modes and has tolerated progressions well. We have an ongoing discussion about her home exercise. She has recently told me that she was able to use [employer] to join a fitness center. Vicki Mcmillan performs the warmup and cooldown standing without limitations. She has increased the resistance of her bands. Will continue to monitor and progress as able.      Expected Outcomes Through exercise at rehab and home, the patient will decrease shortness of breath with daily activities and feel confident in carrying out an exercise regimen at home. Through exercise at rehab and home, the patient will decrease shortness of breath with daily activities and feel confident in carrying out an exercise regimen at home.               Nutrition & Weight - Outcomes:  Pre Biometrics - 10/09/21 1115       Pre Biometrics   Grip Strength --             Post Biometrics - 12/12/21 1511        Post  Biometrics   Weight 58.8 kg    Grip Strength 22 kg             Nutrition:   Nutrition Discharge:   Education Questionnaire Score:  Knowledge Questionnaire Score - 12/10/21 0933       Knowledge Questionnaire Score   Post Score 17/18              Goals reviewed with patient; copy given to patient.

## 2021-12-16 ENCOUNTER — Encounter (HOSPITAL_COMMUNITY): Payer: Self-pay | Admitting: Psychiatry

## 2021-12-16 ENCOUNTER — Other Ambulatory Visit: Payer: Self-pay

## 2021-12-16 ENCOUNTER — Telehealth (HOSPITAL_BASED_OUTPATIENT_CLINIC_OR_DEPARTMENT_OTHER): Payer: Medicare HMO | Admitting: Psychiatry

## 2021-12-16 DIAGNOSIS — F419 Anxiety disorder, unspecified: Secondary | ICD-10-CM

## 2021-12-16 DIAGNOSIS — F331 Major depressive disorder, recurrent, moderate: Secondary | ICD-10-CM | POA: Diagnosis not present

## 2021-12-16 MED ORDER — SERTRALINE HCL 100 MG PO TABS: 150.0000 mg | ORAL_TABLET | Freq: Every day | ORAL | 2 refills | Status: DC

## 2021-12-16 MED ORDER — CLONAZEPAM 0.5 MG PO TABS: ORAL_TABLET | ORAL | 2 refills | Status: DC

## 2021-12-16 NOTE — Progress Notes (Signed)
Virtual Visit via Telephone Note  I connected with Vicki Mcmillan on 12/16/21 at  2:20 PM EST by telephone and verified that I am speaking with the correct person using two identifiers.  Location: Patient: Home Provider: Home Office   I discussed the limitations, risks, security and privacy concerns of performing an evaluation and management service by telephone and the availability of in person appointments. I also discussed with the patient that there may be a patient responsible charge related to this service. The patient expressed understanding and agreed to proceed.   History of Present Illness: Patient is evaluated by phone session.  She finished 9 weeks pulmonary classes to help with reading.  She was going to days a week and that helps her a lot.  Now she started gym and does not get shortness of breath.  Overall she reported her mood is good.  She takes the Klonopin more frequently to help with sleep.  She still have not heard from her son but enjoys the company of 52-year-old nephew who she sees him every day.  Patient also is in taking care of her mother who has back issues.  She denies any panic attack or any anxiety attack.  She denies any crying spells.  She feels her depression is a stable.  She has no tremor or shakes or any EPS.  Patient lives with her mother.  She denies any feeling of hopelessness or worthlessness.  Her energy level is better since he started going to gym.  Past Psychiatric History:  No h/o suicidal attempt or inpatient treatment.  Saw psychiatrist after Chantix caused increased anxiety, hallucination and memory impairment.  Saw Dr. Jessy Oto at [facility] and given Zoloft Abilify with good response.  PCP prescribed Xanax.   Psychiatric Specialty Exam: Physical Exam  Review of Systems  Weight 129 lb (58.5 kg).There is no height or weight on file to calculate BMI.  General Appearance: NA  Eye Contact:  NA  Speech:  Clear and Coherent and Normal Rate  Volume:   Normal  Mood:  Euthymic  Affect:  NA  Thought Process:  Goal Directed  Orientation:  Full (Time, Place, and Person)  Thought Content:  Logical  Suicidal Thoughts:  No  Homicidal Thoughts:  No  Memory:  Immediate;   Good Recent;   Good Remote;   Good  Judgement:  Intact  Insight:  Good  Psychomotor Activity:  NA  Concentration:  Concentration: Good and Attention Span: Good  Recall:  Good  Fund of Knowledge:  Good  Language:  Good  Akathisia:  No  Handed:  Right  AIMS (if indicated):     Assets:  Communication Skills Desire for Improvement Housing Transportation  ADL's:  Intact  Cognition:  WNL  Sleep:   good      Assessment and Plan: Major depressive disorder, recurrent.  Anxiety.  Patient is stable on Zoloft 150 mg daily.  Recommend to take Klonopin as needed as taking every night may cause dependency.  She is taking melatonin 5 mg and I recommend she can take up to 10 mg.  Patient understands and she will start taking Klonopin 0.5 mg to take as needed for severe anxiety.  Continue Zoloft 150 mg daily and melatonin 5-10 mg at bedtime.  Recommended to call us back if she is any question or any concern.  Follow-up in 3 months.  Follow Up Instructions:    I discussed the assessment and treatment plan with the patient. The patient was provided an  opportunity to ask questions and all were answered. The patient agreed with the plan and demonstrated an understanding of the instructions.   The patient was advised to call back or seek an in-person evaluation if the symptoms worsen or if the condition fails to improve as anticipated.  I provided 15 minutes of non-face-to-face time during this encounter.   [name], MD

## 2021-12-25 ENCOUNTER — Other Ambulatory Visit: Payer: Self-pay | Admitting: Student

## 2022-01-15 ENCOUNTER — Other Ambulatory Visit: Payer: Self-pay

## 2022-01-15 ENCOUNTER — Encounter: Payer: Self-pay | Admitting: Pulmonary Disease

## 2022-01-15 ENCOUNTER — Ambulatory Visit: Payer: Medicare HMO | Admitting: Pulmonary Disease

## 2022-01-15 VITALS — BP 108/62 | HR 103 | Temp 98.5°F | Ht 61.0 in | Wt 129.6 lb

## 2022-01-15 DIAGNOSIS — J449 Chronic obstructive pulmonary disease, unspecified: Secondary | ICD-10-CM

## 2022-01-15 MED ORDER — IPRATROPIUM-ALBUTEROL 0.5-2.5 (3) MG/3ML IN SOLN: 3.0000 mL | Freq: Two times a day (BID) | RESPIRATORY_TRACT | 11 refills | Status: DC

## 2022-01-15 MED ORDER — TRELEGY ELLIPTA 200-62.5-25 MCG/ACT IN AEPB: 1.0000 | INHALATION_SPRAY | Freq: Every day | RESPIRATORY_TRACT | 5 refills | Status: DC

## 2022-01-15 MED ORDER — ALBUTEROL SULFATE HFA 108 (90 BASE) MCG/ACT IN AERS: INHALATION_SPRAY | RESPIRATORY_TRACT | 11 refills | Status: DC

## 2022-01-15 NOTE — Progress Notes (Signed)
? ?      ?Vicki Mcmillan    [id number]    06/07/1971 ? ?Primary Care Physician:White, Delmar Landau, NP ? ?Referring Physician: [name], NP ?[address] ?[place],  [place] [address] ? ?Chief complaint: Follow up for COPD ? ?HPI: ?51 year old active smoker with history of COPD, anxiety, depression ?Told that she had mild COPD in 2017.  No PFTs on record ?Complains of increasing dyspnea on exertion with cough, congestion, wheezing.  She is on albuterol inhaler which she is using several times a day with some relief ?Recently seen by primary care and given a triple injection.  She was also prescribed trelegy but cannot afford the co-pay.  She plans on getting better inhaler with new insurance starting January. ? 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  No mold, hot tub, Jacuzzi ?Smoking history: 12.5-pack-year smoker.  Continues to smoke half pack per day ?Travel history: No significant family history ?Relevant family history: Aunt had asthma, COPD.  She was a smoker. ? ?Interim History: ?Finished pulmonary rehab in February 2023.  She states that this has helped her tremendously.  She intends on continuing exercise at the gym ?She also quit smoking 2 weeks ago ? ?Continues on Trelegy inhaler ? ?Outpatient Encounter Medications as of 01/15/2022  ?Medication Sig  ? albuterol (VENTOLIN HFA) 108 (90 Base) MCG/ACT inhaler INHALE 1 TO 2 PUFFS INTO THE LUNGS EVERY 6 HOURS AS NEEDED FOR WHEEZING OR SHORTNESS OF BREATH  ? clonazePAM (KLONOPIN) 0.5 MG tablet Take one tab as needed for panic attack  ? cyclobenzaprine (FLEXERIL) 5 MG tablet Take by mouth.  ? fluticasone (FLONASE) 50 MCG/ACT nasal spray Place 2 sprays into both nostrils daily.  ? Fluticasone-Umeclidin-Vilant (TRELEGY ELLIPTA) 200-62.5-25 MCG/INH AEPB Inhale 1 puff into the lungs daily.  ? ipratropium-albuterol (DUONEB) 0.5-2.5 (3) MG/3ML SOLN Take 3 mLs by nebulization 2 (two) times  daily.  ? melatonin 5 MG TABS Take 5 mg by mouth at bedtime as needed.  ? sertraline (ZOLOFT) 100 MG tablet Take 1.5 tablets (150 mg total) by mouth daily.  ? [DISCONTINUED] TRELEGY ELLIPTA 200-62.5-25 MCG/INH AEPB INHALE 1 PUFF INTO THE LUNGS DAILY (Patient not taking: Reported on 10/09/2021)  ? ?No facility-administered encounter medications on file as of 01/15/2022.  ? ?Physical Exam: ?Blood pressure 108/62, pulse (!) 103, temperature 98.5 ?F (36.9 ?C), temperature source Oral, height '5\' 1"'$  (1.549 m), weight 129 lb 9.6 oz (58.8 kg), SpO2 96 %. ?Gen:      No acute distress ?HEENT:  EOMI, sclera anicteric ?Neck:     No masses; no thyromegaly ?Lungs:    Clear to auscultation bilaterally; normal respiratory effort ?CV:         Regular rate and rhythm; no murmurs ?Abd:      + bowel sounds; soft, non-tender; no palpable masses, no distension ?Ext:    No edema; adequate peripheral perfusion ?Skin:      Warm and dry; no rash ?Neuro: alert and oriented x 3 ?Psych: normal mood and affect  ? ?Data Reviewed: ?Imaging: ?Chest x-ray 10/17/19-no acute cardiopulmonary abnormality. ?Chest x-ray 10/12/2020-hyperinflation, no active process ?Screening CT chest 02/13/2021: Tiny pulmonary nodule (1.8 mm) in LLL, evidence of emphysema but no evidence of malignancy.  ?I reviewed the images personally ? ?PFTs: ?01/11/2021 ?FVC 2.31 [72%], FEV1 1.16 [46%], F/F 50, TLC 5.17 [112%], DLCO 9.10 [47%] ?Severe obstruction with bronchodilator response, air trapping ?Severe diffusion defect ? ?Labs: ?CBC 07/13/2019-WBC  5.1, eos 5%, absolute eosinophil count 255 ?CBC 10/12/2020-WBC 6.3, eos 2.6%, absolute eosinophil count 164 ?IgE 10/12/2020-19 ?WBC 4.8 11/22/20 ? ?Alpha-1 antitrypsin 10/12/2020-150, PI MM ? ?Assessment:  ?Severe COPD ?Has good response to pulmonary rehab ?Continue Trelegy inhaler and exercise regimen ? ?Ex-smoker ?She is congratulated on quitting smoking. ?Continue low-dose screening CT ? ?Plan/Recommendations: ?Continue Trelegy,   albuterol ?Follow-up in 1 year ? ?Marshell Garfinkel MD ?[facility] ?01/15/2022, 3:30 PM ? ?CC: [name], NP ? ?

## 2022-01-15 NOTE — Addendum Note (Signed)
Addended by: Elton Sin on: 01/15/2022 03:41 PM ? ? Modules accepted: Orders ? ?

## 2022-01-15 NOTE — Patient Instructions (Signed)
I am glad your breathing is improved.  Continue to maintain exercise at the gym ?Congrats on quitting smoking ?Continue Trelegy inhaler ?Follow-up in 1 year ?

## 2022-02-24 ENCOUNTER — Ambulatory Visit (INDEPENDENT_AMBULATORY_CARE_PROVIDER_SITE_OTHER)
Admission: RE | Admit: 2022-02-24 | Discharge: 2022-02-24 | Disposition: A | Discharge status: Home / Self Care | Payer: Medicare HMO | Source: Ambulatory Visit | Attending: Acute Care | Admitting: Acute Care

## 2022-02-24 DIAGNOSIS — Z87891 Personal history of nicotine dependence: Secondary | ICD-10-CM | POA: Diagnosis not present

## 2022-02-24 DIAGNOSIS — F17210 Nicotine dependence, cigarettes, uncomplicated: Secondary | ICD-10-CM | POA: Diagnosis not present

## 2022-02-25 ENCOUNTER — Telehealth: Payer: Self-pay | Admitting: Acute Care

## 2022-02-25 NOTE — Telephone Encounter (Signed)
Noted  

## 2022-02-25 NOTE — Telephone Encounter (Signed)
Call report on LDCT from 02/24/22 ? ?Impression: ? ?IMPRESSION: ?1. Lung-RADS 4B, suspicious. Interval development of a new 15.7 mm ?left upper lobe pulmonary nodule. ?2. Coronary artery atherosclerosis. Additional imaging evaluation or ?consultation with Pulmonology or Thoracic Surgery recommended. ?3. Aortic Atherosclerosis (ICD10-I70.0) and Emphysema (ICD10-J43.9). ?  ?These results will be called to the ordering clinician or ?representative by the Radiologist Assistant, and communication ?documented in the PACS or [employer]. ?  ?  ?Electronically Signed ?  By: Misty Stanley M.D. ?  On: 02/25/2022 06:49 ? ?Sending to Judson Roch to be called through the screening program.  ?

## 2022-02-26 ENCOUNTER — Telehealth: Payer: Self-pay | Admitting: Acute Care

## 2022-02-26 DIAGNOSIS — R911 Solitary pulmonary nodule: Secondary | ICD-10-CM

## 2022-02-26 NOTE — Telephone Encounter (Signed)
CT results faxed to PCP with follow up plans included.  ?

## 2022-02-26 NOTE — Telephone Encounter (Signed)
I have called the patient with the results of her low dose CT Chest. I explained that there is a new nodule in her left upper lobe that was not there on last years scan. I explained that I have reviewed the nodule with Dr.Mannam and that he is I agreement with me that the next step is to do a PET scan. The patient is ion agreement. I have ordered the PET. Langley Gauss and [place], please watch for when PET is scheduled and schedule in either Dr. Valeta Harms of [place]'s nodule slots as an OV , whoever has the earliest availability to see the patient.  ? ?Thanks so much. Langley Gauss, please fax results to patient's PCP and let them know plan for follow up. Thanks so much ?

## 2022-03-10 ENCOUNTER — Ambulatory Visit (HOSPITAL_COMMUNITY)
Admission: RE | Admit: 2022-03-10 | Discharge: 2022-03-10 | Disposition: A | Discharge status: Home / Self Care | Payer: Medicare HMO | Source: Ambulatory Visit | Attending: Acute Care | Admitting: Acute Care

## 2022-03-10 DIAGNOSIS — R911 Solitary pulmonary nodule: Secondary | ICD-10-CM | POA: Insufficient documentation

## 2022-03-10 LAB — GLUCOSE, CAPILLARY: Glucose-Capillary: 105 mg/dL — ABNORMAL HIGH (ref 70–99)

## 2022-03-10 MED ORDER — FLUDEOXYGLUCOSE F - 18 (FDG) INJECTION
6.4200 | Freq: Once | INTRAVENOUS | Status: AC
  Administered 2022-03-10: 6.42 via INTRAVENOUS

## 2022-03-12 ENCOUNTER — Other Ambulatory Visit (HOSPITAL_COMMUNITY): Payer: Self-pay | Admitting: Psychiatry

## 2022-03-12 DIAGNOSIS — F419 Anxiety disorder, unspecified: Secondary | ICD-10-CM

## 2022-03-12 DIAGNOSIS — F331 Major depressive disorder, recurrent, moderate: Secondary | ICD-10-CM

## 2022-03-13 ENCOUNTER — Telehealth: Payer: Self-pay | Admitting: Pulmonary Disease

## 2022-03-13 DIAGNOSIS — R911 Solitary pulmonary nodule: Secondary | ICD-10-CM

## 2022-03-13 NOTE — Telephone Encounter (Signed)
PCCM: ? ?Contacted by Dr. Vaughan Browner. ? ?Patient with severe COPD and a hypermetabolic lung nodule requesting navigational bronchoscopy for tissue sampling. ? ?I called spoke with the patient she is agreeable to have this procedure completed on 03/25/2022. ? ?Orders placed for navigational bronchoscopy. ?Orders placed for super D CT scan. ? ?Garner Nash, DO ?[facility] ?03/13/2022 3:55 PM   ? ?

## 2022-03-13 NOTE — Telephone Encounter (Signed)
PET scan reviewed with hypermetabolic left lung nodule, I called and discussed results with patient. We will set up for nav biopsy for tissue diagnosis. ?

## 2022-03-14 ENCOUNTER — Encounter: Payer: Self-pay | Admitting: Pulmonary Disease

## 2022-03-14 DIAGNOSIS — R911 Solitary pulmonary nodule: Secondary | ICD-10-CM | POA: Diagnosis present

## 2022-03-17 ENCOUNTER — Telehealth (HOSPITAL_BASED_OUTPATIENT_CLINIC_OR_DEPARTMENT_OTHER): Payer: Medicare HMO | Admitting: Psychiatry

## 2022-03-17 ENCOUNTER — Encounter (HOSPITAL_COMMUNITY): Payer: Self-pay | Admitting: Psychiatry

## 2022-03-17 DIAGNOSIS — F331 Major depressive disorder, recurrent, moderate: Secondary | ICD-10-CM

## 2022-03-17 DIAGNOSIS — F419 Anxiety disorder, unspecified: Secondary | ICD-10-CM

## 2022-03-17 MED ORDER — CLONAZEPAM 0.5 MG PO TABS: ORAL_TABLET | ORAL | 2 refills | Status: DC

## 2022-03-17 MED ORDER — SERTRALINE HCL 100 MG PO TABS: 150.0000 mg | ORAL_TABLET | Freq: Every day | ORAL | 2 refills | Status: DC

## 2022-03-17 NOTE — Progress Notes (Signed)
Virtual Visit via Telephone Note ? ?I connected with Vicki Mcmillan on 03/17/22 at  3:20 PM EDT by telephone and verified that I am speaking with the correct person using two identifiers. ? ?Location: ?Patient: Home ?Provider: Home Office ?  ?I discussed the limitations, risks, security and privacy concerns of performing an evaluation and management service by telephone and the availability of in person appointments. I also discussed with the patient that there may be a patient responsible charge related to this service. The patient expressed understanding and agreed to proceed. ? ? ?History of Present Illness: ?Patient is evaluated by phone session.  She is anxious because recently her CT scan shows some lesion and she now going for biopsy next week.  She admitted since that news she is nervous and anxious and taking Klonopin almost every day.  She denies any crying spells or any feeling of hopelessness or worthlessness.  She is at high risk for lung cancer.  She feels the medicine is working and she denies any suicidal thoughts.  She has hopeful things will be good.  She lives with her mother and enjoyed the company of 43-1/2-year-old nephew who she see him every day.  She denies any other symptoms like weight loss, decreased energy, fatigue.  She denies any panic attack.  She has no tremor or shakes.  She like to keep the Zoloft and Klonopin.  She does not feel she needed any therapist at this time but willing to give [place] a call back if she needs some help. ? ? ?Past Psychiatric History:  ?No h/o suicidal attempt or inpatient treatment.  Saw psychiatrist after Chantix caused increased anxiety, hallucination and memory impairment.  Saw Dr. Jessy Oto at [facility] and given Zoloft Abilify with good response.  PCP prescribed Xanax.   ? ?Psychiatric Specialty Exam: ?Physical Exam  ?Review of Systems  ?Weight 129 lb (58.5 kg).There is no height or weight on file to calculate BMI.  ?General Appearance: NA  ?Eye Contact:  NA   ?Speech:  Clear and Coherent and Normal Rate  ?Volume:  Normal  ?Mood:  Anxious  ?Affect:  NA  ?Thought Process:  Goal Directed  ?Orientation:  Full (Time, Place, and Person)  ?Thought Content:  WDL  ?Suicidal Thoughts:  No  ?Homicidal Thoughts:  No  ?Memory:  Immediate;   Good ?Recent;   Good ?Remote;   Good  ?Judgement:  Good  ?Insight:  Present  ?Psychomotor Activity:  NA  ?Concentration:  Concentration: Good and Attention Span: Good  ?Recall:  Good  ?Fund of Knowledge:  Good  ?Language:  Good  ?Akathisia:  No  ?Handed:  Right  ?AIMS (if indicated):     ?Assets:  Communication Skills ?Desire for Improvement ?Housing ?Resilience ?Social Support ?Transportation  ?ADL's:  Intact  ?Cognition:  WNL  ?Sleep:   ok with melatonin  ? ? ? ? ?Assessment and Plan: ?Major depressive disorder, recurrent.  Anxiety. ? ?Discussed her anxiety which is related to her upcoming appointment for procedure.  She is not sure if it would be a lung cancer.  Reassurance given.  I offered therapy but patient told if she needed she will call us back.  She is sleeping okay with melatonin.  We will continue Zoloft 150 mg daily and Klonopin 0.5 mg to take as needed for severe anxiety.  Recommended to call us back if she has any question or any concern.  Follow-up in 3 months. ? ?Follow Up Instructions: ? ?  ?I discussed the assessment  and treatment plan with the patient. The patient was provided an opportunity to ask questions and all were answered. The patient agreed with the plan and demonstrated an understanding of the instructions. ?  ?The patient was advised to call back or seek an in-person evaluation if the symptoms worsen or if the condition fails to improve as anticipated. ? ?Collaboration of Care: Primary Care Provider AEB notes are available in epic to review. ? ?Patient/Guardian was advised Release of Information must be obtained prior to any record release in order to collaborate their care with an outside provider.  Patient/Guardian was advised if they have not already done so to contact the registration department to sign all necessary forms in order for [place] to release information regarding their care.  ? ?Consent: Patient/Guardian gives verbal consent for treatment and assignment of benefits for services provided during this visit. Patient/Guardian expressed understanding and agreed to proceed.   ? ?I provided 21 minutes of non-face-to-face time during this encounter. ? ? ?[name], MD  ?

## 2022-03-21 ENCOUNTER — Other Ambulatory Visit: Payer: Self-pay

## 2022-03-21 ENCOUNTER — Encounter (HOSPITAL_COMMUNITY): Payer: Self-pay | Admitting: Pulmonary Disease

## 2022-03-21 ENCOUNTER — Other Ambulatory Visit: Payer: Self-pay | Admitting: Pulmonary Disease

## 2022-03-21 LAB — SARS CORONAVIRUS 2 (TAT 6-24 HRS): SARS Coronavirus 2: NEGATIVE

## 2022-03-21 NOTE — Progress Notes (Signed)
Vicki Mcmillan denies chest pain or shortness of breath. Patient denies having any s/s of Covid in her household.  Patient denies any known exposure to Covid.  Vicki Mcmillan was tested for COVID today, I instructed patient to wear a mask when she is around people she does not live with.  Vicki Mcmillan's PCP is Bradly Bienenstock, NP, Pulmonologist is Dr. Vaughan Browner.  Patient was seen by Dr Celine Ahr  for palpations in 2020, Dr. Shirlee More released patient to follow up if needed.

## 2022-03-24 ENCOUNTER — Ambulatory Visit (INDEPENDENT_AMBULATORY_CARE_PROVIDER_SITE_OTHER)
Admission: RE | Admit: 2022-03-24 | Discharge: 2022-03-24 | Disposition: A | Discharge status: Home / Self Care | Payer: Medicare HMO | Source: Ambulatory Visit | Attending: Pulmonary Disease | Admitting: Pulmonary Disease

## 2022-03-24 DIAGNOSIS — R911 Solitary pulmonary nodule: Secondary | ICD-10-CM | POA: Diagnosis not present

## 2022-03-25 ENCOUNTER — Ambulatory Visit (HOSPITAL_COMMUNITY): Payer: Medicare HMO

## 2022-03-25 ENCOUNTER — Ambulatory Visit (HOSPITAL_COMMUNITY)
Admission: RE | Admit: 2022-03-25 | Discharge: 2022-03-25 | Disposition: A | Discharge status: Home / Self Care | Payer: Medicare HMO | Attending: Pulmonary Disease | Admitting: Pulmonary Disease

## 2022-03-25 ENCOUNTER — Encounter (HOSPITAL_COMMUNITY): Payer: Self-pay | Admitting: Pulmonary Disease

## 2022-03-25 ENCOUNTER — Other Ambulatory Visit: Payer: Self-pay

## 2022-03-25 ENCOUNTER — Ambulatory Visit (HOSPITAL_COMMUNITY): Payer: Medicare HMO | Admitting: Anesthesiology

## 2022-03-25 ENCOUNTER — Encounter (HOSPITAL_COMMUNITY)
Admission: RE | Disposition: A | Discharge status: Home / Self Care | Payer: Self-pay | Source: Home / Self Care | Attending: Pulmonary Disease

## 2022-03-25 ENCOUNTER — Ambulatory Visit (HOSPITAL_BASED_OUTPATIENT_CLINIC_OR_DEPARTMENT_OTHER): Payer: Medicare HMO | Admitting: Anesthesiology

## 2022-03-25 DIAGNOSIS — I252 Old myocardial infarction: Secondary | ICD-10-CM | POA: Insufficient documentation

## 2022-03-25 DIAGNOSIS — J449 Chronic obstructive pulmonary disease, unspecified: Secondary | ICD-10-CM | POA: Insufficient documentation

## 2022-03-25 DIAGNOSIS — F17210 Nicotine dependence, cigarettes, uncomplicated: Secondary | ICD-10-CM | POA: Insufficient documentation

## 2022-03-25 DIAGNOSIS — R911 Solitary pulmonary nodule: Secondary | ICD-10-CM | POA: Insufficient documentation

## 2022-03-25 DIAGNOSIS — F418 Other specified anxiety disorders: Secondary | ICD-10-CM

## 2022-03-25 HISTORY — PX: VIDEO BRONCHOSCOPY WITH RADIAL ENDOBRONCHIAL ULTRASOUND: SHX6849

## 2022-03-25 HISTORY — DX: Unspecified asthma, uncomplicated: J45.909

## 2022-03-25 HISTORY — DX: Acute myocardial infarction, unspecified: I21.9

## 2022-03-25 HISTORY — DX: Headache, unspecified: R51.9

## 2022-03-25 HISTORY — DX: Other specified postprocedural states: Z98.890

## 2022-03-25 HISTORY — DX: Nausea with vomiting, unspecified: R11.2

## 2022-03-25 HISTORY — DX: Gastro-esophageal reflux disease without esophagitis: K21.9

## 2022-03-25 HISTORY — DX: Other complications of anesthesia, initial encounter: T88.59XA

## 2022-03-25 HISTORY — PX: BRONCHIAL BRUSHINGS: SHX5108

## 2022-03-25 HISTORY — DX: Malignant (primary) neoplasm, unspecified: C80.1

## 2022-03-25 HISTORY — DX: Nausea with vomiting, unspecified: Z98.890

## 2022-03-25 HISTORY — PX: BRONCHIAL BIOPSY: SHX5109

## 2022-03-25 HISTORY — PX: BRONCHIAL NEEDLE ASPIRATION BIOPSY: SHX5106

## 2022-03-25 LAB — CBC
HCT: 42.3 % (ref 36.0–46.0)
Hemoglobin: 14.3 g/dL (ref 12.0–15.0)
MCH: 31.2 pg (ref 26.0–34.0)
MCHC: 33.8 g/dL (ref 30.0–36.0)
MCV: 92.4 fL (ref 80.0–100.0)
Platelets: 202 10*3/uL (ref 150–400)
RBC: 4.58 MIL/uL (ref 3.87–5.11)
RDW: 12.2 % (ref 11.5–15.5)
WBC: 4.6 10*3/uL (ref 4.0–10.5)
nRBC: 0 % (ref 0.0–0.2)

## 2022-03-25 LAB — BASIC METABOLIC PANEL
Anion gap: 9 (ref 5–15)
BUN: 20 mg/dL (ref 6–20)
CO2: 23 mmol/L (ref 22–32)
Calcium: 9 mg/dL (ref 8.9–10.3)
Chloride: 107 mmol/L (ref 98–111)
Creatinine, Ser: 0.91 mg/dL (ref 0.44–1.00)
GFR, Estimated: >60 mL/min (ref >60)
Glucose, Bld: 93 mg/dL (ref 70–99)
Potassium: 4.3 mmol/L (ref 3.5–5.1)
Sodium: 139 mmol/L (ref 135–145)

## 2022-03-25 SURGERY — BRONCHOSCOPY, WITH BIOPSY USING ELECTROMAGNETIC NAVIGATION
Anesthesia: General | Laterality: Left

## 2022-03-25 MED ORDER — ONDANSETRON HCL 4 MG/2ML IJ SOLN
INTRAMUSCULAR | Status: DC | PRN
  Administered 2022-03-25: 4 mg via INTRAVENOUS

## 2022-03-25 MED ORDER — CHLORHEXIDINE GLUCONATE 0.12 % MT SOLN
15.0000 mL | OROMUCOSAL | Status: AC
  Administered 2022-03-25: 15 mL via OROMUCOSAL
  Filled 2022-03-25 (×2): qty 15

## 2022-03-25 MED ORDER — LACTATED RINGERS IV SOLN: INTRAVENOUS | Status: DC

## 2022-03-25 MED ORDER — ACETAMINOPHEN 500 MG PO TABS
1000.0000 mg | ORAL_TABLET | Freq: Once | ORAL | Status: AC
  Administered 2022-03-25: 1000 mg via ORAL
  Filled 2022-03-25: qty 2

## 2022-03-25 MED ORDER — PROPOFOL 10 MG/ML IV BOLUS
INTRAVENOUS | Status: DC | PRN
  Administered 2022-03-25: 150 mg via INTRAVENOUS

## 2022-03-25 MED ORDER — FENTANYL CITRATE (PF) 100 MCG/2ML IJ SOLN
INTRAMUSCULAR | Status: DC | PRN
  Administered 2022-03-25: 50 ug via INTRAVENOUS

## 2022-03-25 MED ORDER — PROPOFOL 500 MG/50ML IV EMUL
INTRAVENOUS | Status: DC | PRN
  Administered 2022-03-25: 150 ug/kg/min via INTRAVENOUS

## 2022-03-25 MED ORDER — FENTANYL CITRATE (PF) 100 MCG/2ML IJ SOLN: 25.0000 ug | INTRAMUSCULAR | Status: DC | PRN

## 2022-03-25 MED ORDER — DEXAMETHASONE SODIUM PHOSPHATE 4 MG/ML IJ SOLN
INTRAMUSCULAR | Status: DC | PRN
  Administered 2022-03-25: 10 mg via INTRAVENOUS

## 2022-03-25 MED ORDER — MIDAZOLAM HCL 5 MG/5ML IJ SOLN
INTRAMUSCULAR | Status: DC | PRN
Start: 2022-03-25 — End: 2022-03-25
  Administered 2022-03-25: 2 mg via INTRAVENOUS

## 2022-03-25 MED ORDER — ROCURONIUM BROMIDE 100 MG/10ML IV SOLN
INTRAVENOUS | Status: DC | PRN
  Administered 2022-03-25: 60 mg via INTRAVENOUS

## 2022-03-25 MED ORDER — LIDOCAINE 2% (20 MG/ML) 5 ML SYRINGE
INTRAMUSCULAR | Status: DC | PRN
  Administered 2022-03-25: 60 mg via INTRAVENOUS
  Administered 2022-03-25: 40 mg via INTRAVENOUS

## 2022-03-25 MED ORDER — SUGAMMADEX SODIUM 200 MG/2ML IV SOLN
INTRAVENOUS | Status: DC | PRN
  Administered 2022-03-25: 200 mg via INTRAVENOUS

## 2022-03-25 SURGICAL SUPPLY — 1 items: superlock fiducial marker ×1 IMPLANT

## 2022-03-25 NOTE — Transfer of Care (Signed)
Immediate Anesthesia Transfer of Care Note  Patient: Vicki Mcmillan  Procedure(s) Performed: ROBOTIC ASSISTED NAVIGATIONAL BRONCHOSCOPY (Left) RADIAL ENDOBRONCHIAL ULTRASOUND BRONCHIAL BRUSHINGS BRONCHIAL BIOPSIES BRONCHIAL NEEDLE ASPIRATION BIOPSIES  Patient Location: Endoscopy Unit  Anesthesia Type:General  Level of Consciousness: awake and alert   Airway & Oxygen Therapy: Patient Spontanous Breathing and Patient connected to face mask oxygen  Post-op Assessment: Report given to RN and Post -op Vital signs reviewed and stable  Post vital signs: Reviewed and stable  Last Vitals:  Vitals Value Taken Time  BP    Temp    Pulse 84 03/25/22 1422  Resp 20 03/25/22 1422  SpO2 96 % 03/25/22 1422  Vitals shown include unvalidated device data.  Last Pain:  Vitals:   03/25/22 1235  TempSrc:   PainSc: 0-No pain         Complications: No notable events documented.

## 2022-03-25 NOTE — Interval H&P Note (Signed)
History and Physical Interval Note:  03/25/2022 1:18 PM  Vicki Mcmillan  has presented today for surgery, with the diagnosis of lung nodule.  The various methods of treatment have been discussed with the patient and family. After consideration of risks, benefits and other options for treatment, the patient has consented to  Procedure(s) with comments: ROBOTIC ASSISTED NAVIGATIONAL BRONCHOSCOPY (Left) - ION w/ CIOS as a surgical intervention.  The patient's history has been reviewed, patient examined, no change in status, stable for surgery.  I have reviewed the patient's chart and labs.  Questions were answered to the patient's satisfaction.     [place]

## 2022-03-25 NOTE — H&P (Signed)
Synopsis: Referred in May 2023 for lung nodule by No ref. provider found  Subjective:   PATIENT ID: Vicki Mcmillan GENDER: female DOB: [birth year], MRN: [id number]  No chief complaint on file.   51 year old active smoker with history of COPD, anxiety, depression Told that she had mild COPD in 2017.  No PFTs on record Complains of increasing dyspnea on exertion with cough, congestion, wheezing.  She is on albuterol inhaler which she is using several times a day with some relief Recently seen by primary care and given a triple injection.  She was also prescribed trelegy but cannot afford the co-pay.  She plans on getting better inhaler with new insurance starting January.   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  No mold, hot tub, Jacuzzi Smoking history: 12.5-pack-year smoker.  Continues to smoke half pack per day Travel history: No significant family history Relevant family history: Aunt had asthma, COPD.  She was a smoker.   OV 03/25/2022: Here today for planned outpatient bronchoscopy.  Reviewed nuclear medicine pet imaging.  Nodule concerning for malignancy.  Discussed risk benefits and alternatives of proceeding with robotic assisted navigational bronchoscopy tissue sampling.     Past Medical History:  Diagnosis Date   Anxiety    Asthma    Cancer ([place])    squamous skin cancer on arm; cervical cancer   Complication of anesthesia    COPD (chronic obstructive pulmonary disease) (HCC)    Depression    GERD (gastroesophageal reflux disease)    Headache    Myocardial infarction ([place])    Panic attacks    PONV (postoperative nausea and vomiting)      Family History  Problem Relation Age of Onset   COPD Sister        Died in her 56s from it.   COPD Maternal Aunt    COPD Father      Past Surgical History:  Procedure Laterality Date   TUBAL LIGATION     WRIST SURGERY      Social History   Socioeconomic  History   Marital status: Legally Separated    Spouse name: Not on file   Number of children: 1   Years of education: Not on file   Highest education level: Not on file  Occupational History   Not on file  Tobacco Use   Smoking status: Every Day    Packs/day: 0.25    Years: 15.00    Pack years: 3.75    Types: Cigarettes   Smokeless tobacco: Never   Tobacco comments:    Stopped 01/01/22-lt 01/15/22  Vaping Use   Vaping Use: Former  Substance and Sexual Activity   Alcohol use: No   Drug use: No   Sexual activity: Yes    Birth control/protection: None  Other Topics Concern   Not on file  Social History Narrative   Not on file   Social Determinants of Health   Financial Resource Strain: Not on file  Food Insecurity: Not on file  Transportation Needs: Not on file  Physical Activity: Not on file  Stress: Not on file  Social Connections: Not on file  Intimate Partner Violence: Not on file     Allergies  Allergen Reactions   Hydrocodone-Acetaminophen Nausea And Vomiting   Tomato Rash   Chantix [Varenicline]     hallucination   Oxycodone Nausea And Vomiting and Other (See Comments)    Stomach upset   Wellbutrin [Bupropion]  hallucinations     '@ENCMEDSTART'$ @  Review of Systems  Constitutional:  Negative for chills, fever, malaise/fatigue and weight loss.  HENT:  Negative for hearing loss, sore throat and tinnitus.   Eyes:  Negative for blurred vision and double vision.  Respiratory:  Negative for cough, hemoptysis, sputum production, shortness of breath, wheezing and stridor.   Cardiovascular:  Negative for chest pain, palpitations, orthopnea, leg swelling and PND.  Gastrointestinal:  Negative for abdominal pain, constipation, diarrhea, heartburn, nausea and vomiting.  Genitourinary:  Negative for dysuria, hematuria and urgency.  Musculoskeletal:  Negative for joint pain and myalgias.  Skin:  Negative for itching and rash.  Neurological:  Negative for dizziness,  tingling, weakness and headaches.  Endo/Heme/Allergies:  Negative for environmental allergies. Does not bruise/bleed easily.  Psychiatric/Behavioral:  Negative for depression. The patient is not nervous/anxious and does not have insomnia.   All other systems reviewed and are negative.   Objective:  Physical Exam Vitals reviewed.  Constitutional:      General: She is not in acute distress.    Appearance: She is well-developed.  HENT:     Head: Normocephalic and atraumatic.  Eyes:     General: No scleral icterus.    Conjunctiva/sclera: Conjunctivae normal.     Pupils: Pupils are equal, round, and reactive to light.  Neck:     Vascular: No JVD.     Trachea: No tracheal deviation.  Cardiovascular:     Rate and Rhythm: Normal rate and regular rhythm.     Heart sounds: Normal heart sounds. No murmur heard. Pulmonary:     Effort: Pulmonary effort is normal. No tachypnea, accessory muscle usage or respiratory distress.     Breath sounds: No stridor. No wheezing, rhonchi or rales.  Abdominal:     Palpations: Abdomen is soft.  Musculoskeletal:        General: No tenderness.     Cervical back: Neck supple.  Lymphadenopathy:     Cervical: No cervical adenopathy.  Skin:    General: Skin is warm and dry.     Capillary Refill: Capillary refill takes less than 2 seconds.     Findings: No rash.  Neurological:     Mental Status: She is alert and oriented to person, place, and time.  Psychiatric:        Behavior: Behavior normal.     Vitals:   03/25/22 1157  BP: 106/68  Pulse: 98  Resp: 17  Temp: 98 F (36.7 C)  TempSrc: Oral  SpO2: 96%  Weight: 58.5 kg  Height: '5\' 1"'$  (1.549 m)   96% on RA BMI Readings from Last 3 Encounters:  03/25/22 24.37 kg/m  03/17/22 24.37 kg/m  01/15/22 24.49 kg/m   Wt Readings from Last 3 Encounters:  03/25/22 58.5 kg  03/17/22 58.5 kg  01/15/22 58.8 kg     CBC    Component Value Date/Time   WBC 4.6 03/25/2022 1239   RBC 4.58  03/25/2022 1239   HGB 14.3 03/25/2022 1239   HCT 42.3 03/25/2022 1239   PLT 202 03/25/2022 1239   MCV 92.4 03/25/2022 1239   MCH 31.2 03/25/2022 1239   MCHC 33.8 03/25/2022 1239   RDW 12.2 03/25/2022 1239   LYMPHSABS 2.2 10/12/2020 1156   MONOABS 0.5 10/12/2020 1156   EOSABS 0.2 10/12/2020 1156   BASOSABS 0.0 10/12/2020 1156     Chest Imaging: Nuclear medicine pet imaging, hypermetabolic lung nodule concerning for malignancy.  Pulmonary Functions Testing Results:    Latest Ref  Rng & Units 01/11/2021   12:56 PM  PFT Results  FVC-Pre L 1.79    FVC-Predicted Pre % 56    FVC-Post L 2.31    FVC-Predicted Post % 72    Pre FEV1/FVC % % 44    Post FEV1/FCV % % 50    FEV1-Pre L 0.78    FEV1-Predicted Pre % 30    FEV1-Post L 1.16    DLCO uncorrected ml/min/mmHg 9.10    DLCO UNC% % 47    DLCO corrected ml/min/mmHg 9.10    DLCO COR %Predicted % 47    DLVA Predicted % 49    TLC L 5.17    TLC % Predicted % 112    RV % Predicted % 193      FeNO:   Pathology:   Echocardiogram:   Heart Catheterization:     Assessment & Plan:     ICD-10-CM   1. Lung nodule  R91.1    Added automatically from request for surgery [id number]      Discussion:  Patient presents today for planned robotic assisted navigational bronchoscopy concerning for a hypermetabolic lung nodule concerning for malignancy.  Plan: Today we discussed risk-benefit alternatives of proceeding with bronchoscopy. Patient agreeable to proceed at this time. We discussed the risk of bleeding and pneumothorax.    Current Facility-Administered Medications:    lactated ringers infusion, , Intravenous, Continuous, Roderic Palau, MD, Last Rate: 10 mL/hr at 03/25/22 1239, New Bag at 03/25/22 1239    Garner Nash, DO [facility] 03/25/2022 1:16 PM

## 2022-03-25 NOTE — Discharge Instructions (Signed)
Flexible Bronchoscopy, Care After This sheet gives you information about how to care for yourself after your test. Your doctor may also give you more specific instructions. If you have problems or questions, contact your doctor. Follow these instructions at home: Eating and drinking Do not eat or drink anything (not even water) for 2 hours after your test, or until your numbing medicine (local anesthetic) wears off. When your numbness is gone and your cough and gag reflexes have come back, you may: Eat only soft foods. Slowly drink liquids. The day after the test, go back to your normal diet. Driving Do not drive for 24 hours if you were given a medicine to help you relax (sedative). Do not drive or use heavy machinery while taking prescription pain medicine. General instructions  Take over-the-counter and prescription medicines only as told by your doctor. Return to your normal activities as told. Ask what activities are safe for you. Do not use any products that have nicotine or tobacco in them. This includes cigarettes and e-cigarettes. If you need help quitting, ask your doctor. Keep all follow-up visits as told by your doctor. This is important. It is very important if you had a tissue sample (biopsy) taken. Get help right away if: You have shortness of breath that gets worse. You get light-headed. You feel like you are going to pass out (faint). You have chest pain. You cough up: More than a little blood. More blood than before. Summary Do not eat or drink anything (not even water) for 2 hours after your test, or until your numbing medicine wears off. Do not use cigarettes. Do not use e-cigarettes. Get help right away if you have chest pain.  This information is not intended to replace advice given to you by your health care provider. Make sure you discuss any questions you have with your health care provider. Document Released: 08/17/2009 Document Revised: 10/02/2017 Document  Reviewed: 11/07/2016 Elsevier Patient Education  2020 [employer].

## 2022-03-25 NOTE — Op Note (Signed)
Video Bronchoscopy with Robotic Assisted Bronchoscopic Navigation   Date of Operation: 03/25/2022   Pre-op Diagnosis: Lung nodule   Post-op Diagnosis: Lung nodule   Surgeon: Garner Nash, DO   Assistants: None   Anesthesia: General endotracheal anesthesia  Operation: Flexible video fiberoptic bronchoscopy with robotic assistance and biopsies.  Estimated Blood Loss: Minimal  Complications: None  Indications and History: Vicki Mcmillan is a 51 y.o. female with history of lung nodule. The risks, benefits, complications, treatment options and expected outcomes were discussed with the patient.  The possibilities of pneumothorax, pneumonia, reaction to medication, pulmonary aspiration, perforation of a viscus, bleeding, failure to diagnose a condition and creating a complication requiring transfusion or operation were discussed with the patient who freely signed the consent.    Description of Procedure: The patient was seen in the Preoperative Area, was examined and was deemed appropriate to proceed.  The patient was taken to [facility] endoscopy room 3, identified as Vicki Mcmillan and the procedure verified as Flexible Video Fiberoptic Bronchoscopy.  A Time Out was held and the above information confirmed.   Prior to the date of the procedure a high-resolution CT scan of the chest was performed. Utilizing ION software program a virtual tracheobronchial tree was generated to allow the creation of distinct navigation pathways to the patient's parenchymal abnormalities. After being taken to the operating room general anesthesia was initiated and the patient  was orally intubated. The video fiberoptic bronchoscope was introduced via the endotracheal tube and a general inspection was performed which showed normal right and left lung anatomy, aspiration of the bilateral mainstems was completed to remove any remaining secretions. Robotic catheter inserted into patient's endotracheal tube.   Target #1  left upper lobe: The distinct navigation pathways prepared prior to this procedure were then utilized to navigate to patient's lesion identified on CT scan. The robotic catheter was secured into place and the vision probe was withdrawn.  Lesion location was approximated using fluoroscopy, three-dimensional cone beam CT imaging, and radial endobronchial ultrasound for peripheral targeting. Under fluoroscopic guidance transbronchial needle brushings, transbronchial needle biopsies, and transbronchial forceps biopsies were performed to be sent for cytology and pathology.  Additional transbronchial biopsies were sent for tissue culture.  Following tissue sampling a single fiducial was placed using the fluoroscopic guided fiducial catheter wire and delivery kit.  At the end of the procedure a general airway inspection was performed and there was no evidence of active bleeding. The bronchoscope was removed.  The patient tolerated the procedure well. There was no significant blood loss and there were no obvious complications. A post-procedural chest x-ray is pending.  Samples Target #1: 1. Transbronchial needle brushings from LUL 2. Transbronchial Wang needle biopsies from LUL 3. Transbronchial forceps biopsies from LUL  Plans:  The patient will be discharged from the PACU to home when recovered from anesthesia and after chest x-ray is reviewed. We will review the cytology, pathology and microbiology results with the patient when they become available. Outpatient followup will be with Garner Nash, DO.   Garner Nash, DO [facility] 03/25/2022 2:22 PM

## 2022-03-25 NOTE — Anesthesia Procedure Notes (Signed)
Procedure Name: Intubation Date/Time: 03/25/2022 1:28 PM Performed by: Eulas Post, Vernia W, CRNA Pre-anesthesia Checklist: Patient identified, Emergency Drugs available, Suction available and Patient being monitored Patient Re-evaluated:Patient Re-evaluated prior to induction Oxygen Delivery Method: Circle system utilized Preoxygenation: Pre-oxygenation with 100% oxygen Induction Type: IV induction Ventilation: Mask ventilation without difficulty Laryngoscope Size: Miller and 2 Grade View: Grade I Tube type: Oral Tube size: 8.5 mm Number of attempts: 1 Airway Equipment and Method: Stylet Placement Confirmation: ETT inserted through vocal cords under direct vision, positive ETCO2 and breath sounds checked- equal and bilateral Secured at: 23 cm Tube secured with: Tape Dental Injury: Teeth and Oropharynx as per pre-operative assessment

## 2022-03-25 NOTE — Anesthesia Preprocedure Evaluation (Addendum)
Anesthesia Evaluation  Patient identified by MRN, date of birth, ID band Patient awake    Reviewed: Allergy & Precautions, H&P , NPO status , Patient's Chart, lab work & pertinent test results  History of Anesthesia Complications (+) PONV and history of anesthetic complications  Airway Mallampati: II  TM Distance: >3 FB Neck ROM: Full    Dental no notable dental hx. (+) Teeth Intact, Dental Advisory Given   Pulmonary asthma , COPD,  COPD inhaler, Current Smoker and Patient abstained from smoking.,    Pulmonary exam normal breath sounds clear to auscultation       Cardiovascular + Past MI   Rhythm:Regular Rate:Normal     Neuro/Psych  Headaches, Anxiety Depression    GI/Hepatic Neg liver ROS, GERD  Controlled,  Endo/Other  negative endocrine ROS  Renal/GU negative Renal ROS  negative genitourinary   Musculoskeletal   Abdominal   Peds  Hematology negative hematology ROS (+)   Anesthesia Other Findings   Reproductive/Obstetrics negative OB ROS                            Anesthesia Physical Anesthesia Plan  ASA: 3  Anesthesia Plan: General   Post-op Pain Management: Tylenol PO (pre-op)* and Minimal or no pain anticipated   Induction: Intravenous  PONV Risk Score and Plan: 3 and Ondansetron, Propofol infusion, TIVA and Midazolam  Airway Management Planned: Oral ETT  Additional Equipment:   Intra-op Plan:   Post-operative Plan: Extubation in OR  Informed Consent: I have reviewed the patients History and Physical, chart, labs and discussed the procedure including the risks, benefits and alternatives for the proposed anesthesia with the patient or authorized representative who has indicated his/her understanding and acceptance.     Dental advisory given  Plan Discussed with: CRNA  Anesthesia Plan Comments:         Anesthesia Quick Evaluation

## 2022-03-26 ENCOUNTER — Encounter (HOSPITAL_COMMUNITY): Payer: Self-pay | Admitting: Pulmonary Disease

## 2022-03-26 LAB — CYTOLOGY - NON PAP

## 2022-03-26 NOTE — Anesthesia Postprocedure Evaluation (Signed)
Anesthesia Post Note  Patient: Vicki Mcmillan  Procedure(s) Performed: ROBOTIC ASSISTED NAVIGATIONAL BRONCHOSCOPY (Left) RADIAL ENDOBRONCHIAL ULTRASOUND BRONCHIAL BRUSHINGS BRONCHIAL BIOPSIES BRONCHIAL NEEDLE ASPIRATION BIOPSIES     Patient location during evaluation: PACU Anesthesia Type: General Level of consciousness: awake and alert Pain management: pain level controlled Vital Signs Assessment: post-procedure vital signs reviewed and stable Respiratory status: spontaneous breathing, nonlabored ventilation, respiratory function stable and patient connected to nasal cannula oxygen Cardiovascular status: blood pressure returned to baseline and stable Postop Assessment: no apparent nausea or vomiting Anesthetic complications: no   No notable events documented.  Last Vitals:  Vitals:   03/25/22 1440 03/25/22 1450  BP: 107/63 98/62  Pulse: 77 73  Resp: 18 19  Temp:    SpO2: 92% 94%    Last Pain:  Vitals:   03/25/22 1450  TempSrc:   PainSc: 0-No pain                 Belenda Cruise P [place]

## 2022-03-28 LAB — ACID FAST SMEAR (AFB, MYCOBACTERIA): Acid Fast Smear: NEGATIVE

## 2022-03-30 LAB — AEROBIC/ANAEROBIC CULTURE W GRAM STAIN (SURGICAL/DEEP WOUND)
Culture: NO GROWTH
Gram Stain: NONE SEEN

## 2022-04-04 ENCOUNTER — Encounter: Payer: Self-pay | Admitting: Pulmonary Disease

## 2022-04-04 ENCOUNTER — Ambulatory Visit: Payer: Medicare HMO | Admitting: Pulmonary Disease

## 2022-04-04 VITALS — BP 104/64 | HR 86 | Temp 98.3°F | Ht 61.0 in | Wt 132.0 lb

## 2022-04-04 DIAGNOSIS — F17210 Nicotine dependence, cigarettes, uncomplicated: Secondary | ICD-10-CM | POA: Diagnosis not present

## 2022-04-04 DIAGNOSIS — R911 Solitary pulmonary nodule: Secondary | ICD-10-CM

## 2022-04-04 DIAGNOSIS — J449 Chronic obstructive pulmonary disease, unspecified: Secondary | ICD-10-CM | POA: Diagnosis not present

## 2022-04-04 DIAGNOSIS — J8410 Pulmonary fibrosis, unspecified: Secondary | ICD-10-CM

## 2022-04-04 NOTE — Patient Instructions (Signed)
Thank you for visiting Dr. Valeta Harms at [facility]. Today we recommend the following:  Orders Placed This Encounter  Procedures   CT Chest Wo Contrast   See [place] after your ct chest complete   Return in about 3 months (around 07/05/2022) for with Eric Form, NP, or Dr. Valeta Harms.    Please do your part to reduce the spread of COVID-19.

## 2022-04-04 NOTE — Progress Notes (Signed)
Synopsis: Referred in June 2023 for post bronchoscopy follow-up by [name], NP  Subjective:   PATIENT ID: Vicki Mcmillan GENDER: female DOB: Nov 14, 1970, MRN: [id number]  Chief Complaint  Patient presents with   Follow-up    Follow up. Patient has no complaints.     This is a 51 year old female, past medical history of COPD, asthma, anxiety.  Patient was found to have a left upper lobe pulmonary nodule.  This was initially found on a lung cancer screening CT on 02/24/2022.  Patient had a subsequent nuclear medicine PET scan on 03/10/2022 left upper lobe 12 x 9 mm pulmonary nodule with an SUV max of 11.2 concerning for primary bronchogenic carcinoma.  Patient had a super D CT image on 03/24/2022 prior to bronchoscopy which showed possibly slight decrease in size of that left upper lobe nodule.  But felt that the pulmonary neoplasm could not be excluded.  She opted to move forward with bronchoscopy.  Bronchoscopy was completed on 03/25/2022.  Cytology from bronchoscopy report revealed atypical cells suggestive of noncaseating granulomatous inflammation.  Fungal cultures are still pending, aerobic and anaerobic cultures with no growth to date, acid-fast smear negative however acid-fast cultures are still in process.   Past Medical History:  Diagnosis Date   Anxiety    Asthma    Cancer ([place])    squamous skin cancer on arm; cervical cancer   Complication of anesthesia    COPD (chronic obstructive pulmonary disease) (HCC)    Depression    GERD (gastroesophageal reflux disease)    Headache    Myocardial infarction ([place])    Panic attacks    PONV (postoperative nausea and vomiting)      Family History  Problem Relation Age of Onset   COPD Sister        Died in her 31s from it.   COPD Maternal Aunt    COPD Father      Past Surgical History:  Procedure Laterality Date   BRONCHIAL BIOPSY  03/25/2022   Procedure: BRONCHIAL BIOPSIES;  Surgeon: Garner Nash, DO;  Location: [place];  Service: Pulmonary;;   BRONCHIAL BRUSHINGS  03/25/2022   Procedure: BRONCHIAL BRUSHINGS;  Surgeon: Garner Nash, DO;  Location: [place];  Service: Pulmonary;;   BRONCHIAL NEEDLE ASPIRATION BIOPSY  03/25/2022   Procedure: BRONCHIAL NEEDLE ASPIRATION BIOPSIES;  Surgeon: Garner Nash, DO;  Location: [place];  Service: Pulmonary;;   TUBAL LIGATION     VIDEO BRONCHOSCOPY WITH RADIAL ENDOBRONCHIAL ULTRASOUND  03/25/2022   Procedure: RADIAL ENDOBRONCHIAL ULTRASOUND;  Surgeon: Garner Nash, DO;  Location: [facility];  Service: Pulmonary;;   WRIST SURGERY      Social History   Socioeconomic History   Marital status: Legally Separated    Spouse name: Not on file   Number of children: 1   Years of education: Not on file   Highest education level: Not on file  Occupational History   Not on file  Tobacco Use   Smoking status: Every Day    Packs/day: 0.25    Years: 15.00    Pack years: 3.75    Types: Cigarettes   Smokeless tobacco: Never   Tobacco comments:    Stopped 01/01/22-lt 01/15/22  Vaping Use   Vaping Use: Former  Substance and Sexual Activity   Alcohol use: No   Drug use: No   Sexual activity: Yes    Birth control/protection: None  Other Topics Concern   Not on file  Social  History Narrative   Not on file   Social Determinants of Health   Financial Resource Strain: Not on file  Food Insecurity: Not on file  Transportation Needs: Not on file  Physical Activity: Not on file  Stress: Not on file  Social Connections: Not on file  Intimate Partner Violence: Not on file     Allergies  Allergen Reactions   Hydrocodone-Acetaminophen Nausea And Vomiting   Tomato Rash   Chantix [Varenicline]     hallucination   Oxycodone Nausea And Vomiting and Other (See Comments)    Stomach upset   Wellbutrin [Bupropion]     hallucinations     Outpatient Medications Prior to Visit  Medication Sig Dispense Refill   albuterol (VENTOLIN HFA) 108 (90  Base) MCG/ACT inhaler INHALE 1 TO 2 PUFFS INTO THE LUNGS EVERY 6 HOURS AS NEEDED FOR WHEEZING OR SHORTNESS OF BREATH 18 g 11   clonazePAM (KLONOPIN) 0.5 MG tablet Take one tab as needed for panic attack 30 tablet 2   cyclobenzaprine (FLEXERIL) 5 MG tablet Take 5 mg by mouth at bedtime.     fluticasone (FLONASE) 50 MCG/ACT nasal spray Place 2 sprays into both nostrils daily. (Patient taking differently: Place 2 sprays into both nostrils daily as needed for allergies.) 15.8 g 0   Fluticasone-Umeclidin-Vilant (TRELEGY ELLIPTA) 200-62.5-25 MCG/ACT AEPB Inhale 1 puff into the lungs daily. 60 each 5   ipratropium-albuterol (DUONEB) 0.5-2.5 (3) MG/3ML SOLN Take 3 mLs by nebulization 2 (two) times daily. (Patient taking differently: Take 3 mLs by nebulization 2 (two) times daily as needed (asthma).) 360 mL 11   melatonin 5 MG TABS Take 5 mg by mouth at bedtime.     sertraline (ZOLOFT) 100 MG tablet Take 1.5 tablets (150 mg total) by mouth daily. 45 tablet 2   No facility-administered medications prior to visit.    Review of Systems  Constitutional:  Negative for chills, fever, malaise/fatigue and weight loss.  HENT:  Negative for hearing loss, sore throat and tinnitus.   Eyes:  Negative for blurred vision and double vision.  Respiratory:  Negative for cough, hemoptysis, sputum production, shortness of breath, wheezing and stridor.   Cardiovascular:  Negative for chest pain, palpitations, orthopnea, leg swelling and PND.  Gastrointestinal:  Negative for abdominal pain, constipation, diarrhea, heartburn, nausea and vomiting.  Genitourinary:  Negative for dysuria, hematuria and urgency.  Musculoskeletal:  Negative for joint pain and myalgias.  Skin:  Negative for itching and rash.  Neurological:  Negative for dizziness, tingling, weakness and headaches.  Endo/Heme/Allergies:  Negative for environmental allergies. Does not bruise/bleed easily.  Psychiatric/Behavioral:  Negative for depression. The  patient is not nervous/anxious and does not have insomnia.   All other systems reviewed and are negative.   Objective:  Physical Exam Vitals reviewed.  Constitutional:      General: She is not in acute distress.    Appearance: She is well-developed.  HENT:     Head: Normocephalic and atraumatic.  Eyes:     General: No scleral icterus.    Conjunctiva/sclera: Conjunctivae normal.     Pupils: Pupils are equal, round, and reactive to light.  Neck:     Vascular: No JVD.     Trachea: No tracheal deviation.  Cardiovascular:     Rate and Rhythm: Normal rate and regular rhythm.     Heart sounds: Normal heart sounds. No murmur heard. Pulmonary:     Effort: Pulmonary effort is normal. No tachypnea, accessory muscle usage or respiratory distress.  Breath sounds: No stridor. No wheezing, rhonchi or rales.  Abdominal:     General: There is no distension.     Palpations: Abdomen is soft.     Tenderness: There is no abdominal tenderness.  Musculoskeletal:        General: No tenderness.     Cervical back: Neck supple.  Lymphadenopathy:     Cervical: No cervical adenopathy.  Skin:    General: Skin is warm and dry.     Capillary Refill: Capillary refill takes less than 2 seconds.     Findings: No rash.  Neurological:     Mental Status: She is alert and oriented to person, place, and time.  Psychiatric:        Behavior: Behavior normal.     Vitals:   04/04/22 0908  BP: 104/64  Pulse: 86  Temp: 98.3 F (36.8 C)  TempSrc: Oral  SpO2: 95%  Weight: 132 lb (59.9 kg)  Height: '5\' 1"'$  (1.549 m)   95% on RA BMI Readings from Last 3 Encounters:  04/04/22 24.94 kg/m  03/25/22 24.37 kg/m  01/15/22 24.49 kg/m   Wt Readings from Last 3 Encounters:  04/04/22 132 lb (59.9 kg)  03/25/22 129 lb (58.5 kg)  01/15/22 129 lb 9.6 oz (58.8 kg)     CBC    Component Value Date/Time   WBC 4.6 03/25/2022 1239   RBC 4.58 03/25/2022 1239   HGB 14.3 03/25/2022 1239   HCT 42.3 03/25/2022  1239   PLT 202 03/25/2022 1239   MCV 92.4 03/25/2022 1239   MCH 31.2 03/25/2022 1239   MCHC 33.8 03/25/2022 1239   RDW 12.2 03/25/2022 1239   LYMPHSABS 2.2 10/12/2020 1156   MONOABS 0.5 10/12/2020 1156   EOSABS 0.2 10/12/2020 1156   BASOSABS 0.0 10/12/2020 1156     Chest Imaging: 03/24/2022 CT chest: Partial decrease in size of hypermetabolic left upper lobe lung nodule. The patient's images have been independently reviewed by me.    Pulmonary Functions Testing Results:    Latest Ref Rng & Units 01/11/2021   12:56 PM  PFT Results  FVC-Pre L 1.79    FVC-Predicted Pre % 56    FVC-Post L 2.31    FVC-Predicted Post % 72    Pre FEV1/FVC % % 44    Post FEV1/FCV % % 50    FEV1-Pre L 0.78    FEV1-Predicted Pre % 30    FEV1-Post L 1.16    DLCO uncorrected ml/min/mmHg 9.10    DLCO UNC% % 47    DLCO corrected ml/min/mmHg 9.10    DLCO COR %Predicted % 47    DLVA Predicted % 49    TLC L 5.17    TLC % Predicted % 112    RV % Predicted % 193      FeNO:   Pathology:   Echocardiogram:   Heart Catheterization:     Assessment & Plan:     ICD-10-CM   1. 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 Lung nodule  R91.1 CT Chest Wo Contrast    3. COPD (chronic obstructive pulmonary disease) with chronic bronchitis ([place])  J44.9     4. Cigarette smoker  F17.210       Discussion:  This is a 51 year old female, longstanding history of smoking, COPD, current tobacco abuse on triple therapy inhaler.  Found to have a hypermetabolic lung nodule taken for tissue biopsy has noncaseating granulomas, fungal cultures and AFB cultures  have not finalized.  Thankfully I do not believe this is a malignancy.  Plan: We will await final cultures. We will call patient if there is anything that does appear. In the meantime she needs at least a 18-monthnoncontrasted repeat CT scan. We will have this complete and have her set up to see SEric Form NP to follow-up  results.    Current Outpatient Medications:    albuterol (VENTOLIN HFA) 108 (90 Base) MCG/ACT inhaler, INHALE 1 TO 2 PUFFS INTO THE LUNGS EVERY 6 HOURS AS NEEDED FOR WHEEZING OR SHORTNESS OF BREATH, Disp: 18 g, Rfl: 11   clonazePAM (KLONOPIN) 0.5 MG tablet, Take one tab as needed for panic attack, Disp: 30 tablet, Rfl: 2   cyclobenzaprine (FLEXERIL) 5 MG tablet, Take 5 mg by mouth at bedtime., Disp: , Rfl:    fluticasone (FLONASE) 50 MCG/ACT nasal spray, Place 2 sprays into both nostrils daily. (Patient taking differently: Place 2 sprays into both nostrils daily as needed for allergies.), Disp: 15.8 g, Rfl: 0   Fluticasone-Umeclidin-Vilant (TRELEGY ELLIPTA) 200-62.5-25 MCG/ACT AEPB, Inhale 1 puff into the lungs daily., Disp: 60 each, Rfl: 5   ipratropium-albuterol (DUONEB) 0.5-2.5 (3) MG/3ML SOLN, Take 3 mLs by nebulization 2 (two) times daily. (Patient taking differently: Take 3 mLs by nebulization 2 (two) times daily as needed (asthma).), Disp: 360 mL, Rfl: 11   melatonin 5 MG TABS, Take 5 mg by mouth at bedtime., Disp: , Rfl:    sertraline (ZOLOFT) 100 MG tablet, Take 1.5 tablets (150 mg total) by mouth daily., Disp: 45 tablet, Rfl: 2    BGarner Nash DO [facility] 04/04/2022 9:33 AM

## 2022-04-15 ENCOUNTER — Ambulatory Visit: Payer: Medicare HMO | Admitting: Acute Care

## 2022-04-24 LAB — FUNGUS CULTURE RESULT

## 2022-04-24 LAB — FUNGAL ORGANISM REFLEX

## 2022-04-24 LAB — FUNGUS CULTURE WITH STAIN

## 2022-05-08 LAB — ACID FAST CULTURE WITH REFLEXED SENSITIVITIES (MYCOBACTERIA): Acid Fast Culture: NEGATIVE

## 2022-05-22 ENCOUNTER — Telehealth: Payer: Self-pay | Admitting: Pulmonary Disease

## 2022-05-22 MED ORDER — ALBUTEROL SULFATE HFA 108 (90 BASE) MCG/ACT IN AERS: INHALATION_SPRAY | RESPIRATORY_TRACT | 11 refills | Status: DC

## 2022-05-22 NOTE — Telephone Encounter (Signed)
Spoke with pt who stated that she was needing a refill of her albuterol inhaler. Stated that the pharmacy said they sent request to our office for the refill but had not heard anything about it. Pt said that she was currently out. I stated to pt that I could refill her inhaler and she verbalized understanding. Rx sent to preferred pharmacy for pt. Nothing further needed.

## 2022-06-16 ENCOUNTER — Encounter (HOSPITAL_COMMUNITY): Payer: Self-pay | Admitting: Psychiatry

## 2022-06-16 ENCOUNTER — Telehealth (HOSPITAL_BASED_OUTPATIENT_CLINIC_OR_DEPARTMENT_OTHER): Payer: Medicare HMO | Admitting: Psychiatry

## 2022-06-16 DIAGNOSIS — F419 Anxiety disorder, unspecified: Secondary | ICD-10-CM | POA: Diagnosis not present

## 2022-06-16 DIAGNOSIS — F331 Major depressive disorder, recurrent, moderate: Secondary | ICD-10-CM

## 2022-06-16 MED ORDER — SERTRALINE HCL 100 MG PO TABS: 150.0000 mg | ORAL_TABLET | Freq: Every day | ORAL | 2 refills | Status: DC

## 2022-06-16 NOTE — Progress Notes (Signed)
Virtual Visit via Telephone Note  I connected with Vicki Mcmillan on 06/16/22 at  4:00 PM EDT by telephone and verified that I am speaking with the correct person using two identifiers.  Location: Patient: Home Provider: Home Office   I discussed the limitations, risks, security and privacy concerns of performing an evaluation and management service by telephone and the availability of in person appointments. I also discussed with the patient that there may be a patient responsible charge related to this service. The patient expressed understanding and agreed to proceed.   History of Present Illness: Patient is evaluated by phone session.  She is more relaxed and calm because of recent test shows that she has no cancerous cells in her lungs.  She has some atypical and she may need one more test next month and after that she may go back to annual checkup.  Patient told things are going very well.  She is sleeping good.  She lives with her mother who has some health issues but overall things are manageable.  She denies any crying spells or any feeling of hopelessness or worthlessness.  She is taking Zoloft and Klonopin as needed.  Her last prescription is still has refill of Klonopin and does not need a new prescription.  She has no tremor or shakes or any EPS.  Her rage level is good.  Her appetite is okay.   Past Psychiatric History:  No h/o suicidal attempt or inpatient treatment.  Saw psychiatrist after Chantix caused increased anxiety, hallucination and memory impairment.  Saw Dr. Jessy Oto at [facility] and given Zoloft Abilify with good response.  PCP prescribed Xanax.     Psychiatric Specialty Exam: Physical Exam  Review of Systems  Weight 130 lb (59 kg).There is no height or weight on file to calculate BMI.  General Appearance: NA  Eye Contact:  NA  Speech:  Clear and Coherent and Normal Rate  Volume:  Normal  Mood:  Euthymic  Affect:  NA  Thought Process:  Goal Directed  Orientation:   Full (Time, Place, and Person)  Thought Content:  Logical  Suicidal Thoughts:  No  Homicidal Thoughts:  No  Memory:  Immediate;   Good Recent;   Good Remote;   Good  Judgement:  Good  Insight:  Present  Psychomotor Activity:  NA  Concentration:  Concentration: Good and Attention Span: Good  Recall:  Good  Fund of Knowledge:  Good  Language:  Good  Akathisia:  No  Handed:  Right  AIMS (if indicated):     Assets:  Communication Skills Desire for Improvement Housing Resilience Social Support Transportation  ADL's:  Intact  Cognition:  WNL  Sleep:   ok      Assessment and Plan: Major depressive disorder, recurrent.  Anxiety.  Patient is more relaxed and calm since the last test shows that she has no more cancer cells.  She wants to keep the Zoloft 150 mg daily.  She does not need a new prescription of Klonopin as she still has refills remaining.  Discussed medication side effects and benefits.  Recommended to call us back if she has any question or any concern.  Follow-up in 3 months.  Follow Up Instructions:    I discussed the assessment and treatment plan with the patient. The patient was provided an opportunity to ask questions and all were answered. The patient agreed with the plan and demonstrated an understanding of the instructions.   The patient was advised to call back or  seek an in-person evaluation if the symptoms worsen or if the condition fails to improve as anticipated.  Collaboration of Care: Primary Care Provider AEB notes are available in epic to review.  Patient/Guardian was advised Release of Information must be obtained prior to any record release in order to collaborate their care with an outside provider. Patient/Guardian was advised if they have not already done so to contact the registration department to sign all necessary forms in order for [place] to release information regarding their care.   Consent: Patient/Guardian gives verbal consent for treatment  and assignment of benefits for services provided during this visit. Patient/Guardian expressed understanding and agreed to proceed.    I provided 18 minutes of non-face-to-face time during this encounter.   [name], MD

## 2022-07-01 ENCOUNTER — Other Ambulatory Visit: Payer: Self-pay | Admitting: *Deleted

## 2022-07-01 MED ORDER — TRELEGY ELLIPTA 200-62.5-25 MCG/ACT IN AEPB: 1.0000 | INHALATION_SPRAY | Freq: Every day | RESPIRATORY_TRACT | 3 refills | Status: DC

## 2022-07-04 ENCOUNTER — Ambulatory Visit (HOSPITAL_BASED_OUTPATIENT_CLINIC_OR_DEPARTMENT_OTHER)
Admission: RE | Admit: 2022-07-04 | Discharge: 2022-07-04 | Disposition: A | Discharge status: Home / Self Care | Payer: Medicare HMO | Source: Ambulatory Visit | Attending: Pulmonary Disease | Admitting: Pulmonary Disease

## 2022-07-04 DIAGNOSIS — R911 Solitary pulmonary nodule: Secondary | ICD-10-CM

## 2022-07-14 ENCOUNTER — Other Ambulatory Visit: Payer: Self-pay | Admitting: *Deleted

## 2022-07-14 ENCOUNTER — Ambulatory Visit: Payer: Medicare HMO | Admitting: Pulmonary Disease

## 2022-07-14 ENCOUNTER — Encounter: Payer: Self-pay | Admitting: Pulmonary Disease

## 2022-07-14 VITALS — BP 102/80 | HR 107 | Ht 61.0 in | Wt 129.4 lb

## 2022-07-14 DIAGNOSIS — R911 Solitary pulmonary nodule: Secondary | ICD-10-CM | POA: Diagnosis not present

## 2022-07-14 DIAGNOSIS — Z87891 Personal history of nicotine dependence: Secondary | ICD-10-CM

## 2022-07-14 DIAGNOSIS — J449 Chronic obstructive pulmonary disease, unspecified: Secondary | ICD-10-CM | POA: Diagnosis not present

## 2022-07-14 DIAGNOSIS — Z720 Tobacco use: Secondary | ICD-10-CM

## 2022-07-14 DIAGNOSIS — J8410 Pulmonary fibrosis, unspecified: Secondary | ICD-10-CM | POA: Diagnosis not present

## 2022-07-14 DIAGNOSIS — K449 Diaphragmatic hernia without obstruction or gangrene: Secondary | ICD-10-CM | POA: Diagnosis not present

## 2022-07-14 DIAGNOSIS — Z122 Encounter for screening for malignant neoplasm of respiratory organs: Secondary | ICD-10-CM

## 2022-07-14 NOTE — Patient Instructions (Signed)
Thank you for visiting Dr. Valeta Harms at [facility]. Today we recommend the following:  Orders Placed This Encounter  Procedures   Ambulatory referral to Cardiothoracic Surgery   Continue trelegy and albuterol  Repeat LDCT in Sept 2024  Return in about 1 year (around 07/15/2023) for with Eric Form, NP, or Dr. Valeta Harms.    Please do your part to reduce the spread of COVID-19.

## 2022-07-14 NOTE — Progress Notes (Signed)
Synopsis: Referred in June 2023 for post bronchoscopy follow-up by [name], NP  Subjective:   PATIENT ID: Vicki Mcmillan GENDER: female DOB: Nov 09, 1970, MRN: [id number]  Chief Complaint  Patient presents with   Follow-up    This is a 51 year old female, past medical history of COPD, asthma, anxiety.  Patient was found to have a left upper lobe pulmonary nodule.  This was initially found on a lung cancer screening CT on 02/24/2022.  Patient had a subsequent nuclear medicine PET scan on 03/10/2022 left upper lobe 12 x 9 mm pulmonary nodule with an SUV max of 11.2 concerning for primary bronchogenic carcinoma.  Patient had a super D CT image on 03/24/2022 prior to bronchoscopy which showed possibly slight decrease in size of that left upper lobe nodule.  But felt that the pulmonary neoplasm could not be excluded.  She opted to move forward with bronchoscopy.  Bronchoscopy was completed on 03/25/2022.  Cytology from bronchoscopy report revealed atypical cells suggestive of noncaseating granulomatous inflammation.  Fungal cultures are still pending, aerobic and anaerobic cultures with no growth to date, acid-fast smear negative however acid-fast cultures are still in process.  OV 07/14/2022: Here today for follow-up.Patient's initial bronchoscopy and biopsy was consistent with granulomatous inflammation.  Follow-up CT imaging completed on 07/06/2022 shows only a 5 mm nodular density that is residual with its adjacent fiducial marker.  This is consistent with a benign lesion.  She does have evidence of emphysema.  She has no respiratory complaints today.  Using her Trelegy daily.  She does have reflux symptoms.  They have bothered her for some time she occasionally has some pain in her epigastrium.  CT did confirm small hiatal hernia.     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   COPD Maternal Aunt    COPD Father      Past Surgical History:  Procedure Laterality Date   BRONCHIAL BIOPSY  03/25/2022   Procedure: BRONCHIAL BIOPSIES;  Surgeon: Garner Nash, DO;  Location: [facility];  Service: Pulmonary;;   BRONCHIAL BRUSHINGS  03/25/2022   Procedure: BRONCHIAL BRUSHINGS;  Surgeon: Garner Nash, DO;  Location: [facility];  Service: Pulmonary;;   BRONCHIAL NEEDLE ASPIRATION BIOPSY  03/25/2022   Procedure: BRONCHIAL NEEDLE ASPIRATION BIOPSIES;  Surgeon: Garner Nash, DO;  Location[facility];  Service: Pulmonary;;   TUBAL LIGATION     VIDEO BRONCHOSCOPY WITH RADIAL ENDOBRONCHIAL ULTRASOUND  03/25/2022   Procedure: RADIAL ENDOBRONCHIAL ULTRASOUND;  Surgeon: Garner Nash, DO;  Location: [facility];  Service: Pulmonary;;   WRIST SURGERY      Social History   Socioeconomic History   Marital status: Legally Separated    Spouse name: Not on file   Number of children: 1   Years of education: Not on file   Highest education level: Not on file  Occupational History   Not on file  Tobacco Use  Smoking status: Former    Packs/day: 0.25    Years: 15.00    Total pack years: 3.75    Types: Cigarettes    Quit date: 06/09/2022    Years since quitting: 0.0   Smokeless tobacco: Never   Tobacco comments:    Quit smoking cigarettes 8/72023. Tay 07/14/2022  Vaping Use   Vaping Use: Former  Substance and Sexual Activity   Alcohol use: No   Drug use: No   Sexual activity: Yes    Birth control/protection: None  Other Topics Concern   Not on file  Social History Narrative   Not on file   Social Determinants of Health   Financial Resource  Strain: High Risk (12/21/2018)   Overall Financial Resource Strain (CARDIA)    Difficulty of Paying Living Expenses: Very hard  Food Insecurity: Food Insecurity Present (12/21/2018)   Hunger Vital Sign    Worried About Running Out of Food in the Last Year: Sometimes true    Ran Out of Food in the Last Year: Sometimes true  Transportation Needs: Unmet Transportation Needs (12/21/2018)   PRAPARE - Transportation    Lack of Transportation (Medical): Yes    Lack of Transportation (Non-Medical): Yes  Physical Activity: Inactive (12/21/2018)   Exercise Vital Sign    Days of Exercise per Week: 0 days    Minutes of Exercise per Session: 0 min  Stress: Stress Concern Present (12/21/2018)   [place]    Feeling of Stress : Very much  Social Connections: Moderately Isolated (12/21/2018)   Social Connection and Isolation Panel [NHANES]    Frequency of Communication with Friends and Family: Never    Frequency of Social Gatherings with Friends and Family: More than three times a week    Attends Religious Services: Never    Marine scientist or Organizations: No    Attends Archivist Meetings: Never    Marital Status: Separated  Intimate Partner Violence: Not At Risk (12/21/2018)   Humiliation, Afraid, Rape, and Kick questionnaire    Fear of Current or Ex-Partner: No    Emotionally Abused: No    Physically Abused: No    Sexually Abused: No     Allergies  Allergen Reactions   Hydrocodone-Acetaminophen Nausea And Vomiting   Tomato Rash   Chantix [Varenicline]     hallucination   Oxycodone Nausea And Vomiting and Other (See Comments)    Stomach upset   Wellbutrin [Bupropion]     hallucinations     Outpatient Medications Prior to Visit  Medication Sig Dispense Refill   albuterol (VENTOLIN HFA) 108 (90 Base) MCG/ACT inhaler INHALE 1 TO 2 PUFFS INTO THE LUNGS EVERY 6 HOURS AS NEEDED FOR WHEEZING OR SHORTNESS OF  BREATH 18 g 11   fluticasone (FLONASE) 50 MCG/ACT nasal spray Place 2 sprays into both nostrils daily. (Patient taking differently: Place 2 sprays into both nostrils daily as needed for allergies.) 15.8 g 0   Fluticasone-Umeclidin-Vilant (TRELEGY ELLIPTA) 200-62.5-25 MCG/ACT AEPB Inhale 1 puff into the lungs daily. 180 each 3   ipratropium-albuterol (DUONEB) 0.5-2.5 (3) MG/3ML SOLN Take 3 mLs by nebulization 2 (two) times daily. (Patient taking differently: Take 3 mLs by nebulization 2 (two) times daily as needed (asthma).) 360 mL 11   loratadine (CLARITIN) 10 MG tablet Take 10 mg by mouth daily.     clonazePAM (KLONOPIN) 0.5 MG tablet Take one tab as needed for panic attack 30 tablet 2   cyclobenzaprine (  FLEXERIL) 5 MG tablet Take 5 mg by mouth at bedtime.     melatonin 5 MG TABS Take 5 mg by mouth at bedtime.     sertraline (ZOLOFT) 100 MG tablet Take 1.5 tablets (150 mg total) by mouth daily. 45 tablet 2   No facility-administered medications prior to visit.    Review of Systems  Constitutional:  Negative for chills, fever, malaise/fatigue and weight loss.  HENT:  Negative for hearing loss, sore throat and tinnitus.   Eyes:  Negative for blurred vision and double vision.  Respiratory:  Negative for cough, hemoptysis, sputum production, shortness of breath, wheezing and stridor.   Cardiovascular:  Negative for chest pain, palpitations, orthopnea, leg swelling and PND.  Gastrointestinal:  Positive for abdominal pain and heartburn. Negative for constipation, diarrhea, nausea and vomiting.  Genitourinary:  Negative for dysuria, hematuria and urgency.  Musculoskeletal:  Negative for joint pain and myalgias.  Skin:  Negative for itching and rash.  Neurological:  Negative for dizziness, tingling, weakness and headaches.  Endo/Heme/Allergies:  Negative for environmental allergies. Does not bruise/bleed easily.  Psychiatric/Behavioral:  Negative for depression. The patient is not nervous/anxious  and does not have insomnia.   All other systems reviewed and are negative.    Objective:  Physical Exam Vitals reviewed.  Constitutional:      General: She is not in acute distress.    Appearance: She is well-developed.  HENT:     Head: Normocephalic and atraumatic.  Eyes:     General: No scleral icterus.    Conjunctiva/sclera: Conjunctivae normal.     Pupils: Pupils are equal, round, and reactive to light.  Neck:     Vascular: No JVD.     Trachea: No tracheal deviation.  Cardiovascular:     Rate and Rhythm: Normal rate and regular rhythm.     Heart sounds: Normal heart sounds. No murmur heard. Pulmonary:     Effort: Pulmonary effort is normal. No tachypnea, accessory muscle usage or respiratory distress.     Breath sounds: No stridor. No wheezing, rhonchi or rales.  Abdominal:     General: There is no distension.     Palpations: Abdomen is soft.     Tenderness: There is no abdominal tenderness.  Musculoskeletal:        General: No tenderness.     Cervical back: Neck supple.  Lymphadenopathy:     Cervical: No cervical adenopathy.  Skin:    General: Skin is warm and dry.     Capillary Refill: Capillary refill takes less than 2 seconds.     Findings: No rash.  Neurological:     Mental Status: She is alert and oriented to person, place, and time.  Psychiatric:        Behavior: Behavior normal.      Vitals:   07/14/22 1424  BP: 102/80  Pulse: (!) 107  SpO2: 93%  Weight: 129 lb 6.4 oz (58.7 kg)  Height: '5\' 1"'$  (1.549 m)   93% on RA BMI Readings from Last 3 Encounters:  07/14/22 24.45 kg/m  04/04/22 24.94 kg/m  03/25/22 24.37 kg/m   Wt Readings from Last 3 Encounters:  07/14/22 129 lb 6.4 oz (58.7 kg)  04/04/22 132 lb (59.9 kg)  03/25/22 129 lb (58.5 kg)     CBC    Component Value Date/Time   WBC 4.6 03/25/2022 1239   RBC 4.58 03/25/2022 1239   HGB 14.3 03/25/2022 1239   HCT 42.3 03/25/2022 1239   PLT 202 03/25/2022  1239   MCV 92.4 03/25/2022  1239   MCH 31.2 03/25/2022 1239   MCHC 33.8 03/25/2022 1239   RDW 12.2 03/25/2022 1239   LYMPHSABS 2.2 10/12/2020 1156   MONOABS 0.5 10/12/2020 1156   EOSABS 0.2 10/12/2020 1156   BASOSABS 0.0 10/12/2020 1156     Chest Imaging: 03/24/2022 CT chest: Partial decrease in size of hypermetabolic left upper lobe lung nodule. The patient's images have been independently reviewed by me.    September 2023 CT chest: 5 mm density in the left upper lobe adjacent to prior fiducial marker.  Nodule has significantly involuted in size. Consistent with granulomatous findings that was found on previous biopsy results. The patient's images have been independently reviewed by me.    Pulmonary Functions Testing Results:    Latest Ref Rng & Units 01/11/2021   12:56 PM  PFT Results  FVC-Pre L 1.79   FVC-Predicted Pre % 56   FVC-Post L 2.31   FVC-Predicted Post % 72   Pre FEV1/FVC % % 44   Post FEV1/FCV % % 50   FEV1-Pre L 0.78   FEV1-Predicted Pre % 30   FEV1-Post L 1.16   DLCO uncorrected ml/min/mmHg 9.10   DLCO UNC% % 47   DLCO corrected ml/min/mmHg 9.10   DLCO COR %Predicted % 47   DLVA Predicted % 49   TLC L 5.17   TLC % Predicted % 112   RV % Predicted % 193     FeNO:   Pathology:   Echocardiogram:   Heart Catheterization:     Assessment & Plan:     ICD-10-CM   1. Hiatal hernia  K44.9 Ambulatory referral to Cardiothoracic Surgery    2. Granuloma present on biopsy of lung ([place] [facility])  J84.10     3. Lung nodule  R91.1     4. COPD (chronic obstructive pulmonary disease) with chronic bronchitis ([place])  J44.9     5. Tobacco use  Z72.0        Discussion:  This is a 51 year old female longstanding history of tobacco use, COPD currently on triple therapy inhaler.  Biopsy with noncaseating granulomas.  Cultures no growth to date.  She does have a small hiatal hernia.  Plan: Lung nodule no additional follow-up needed. Can reenroll in lung cancer screening CT in September  2024. Small hiatal hernia, referral to cardiothoracic surgery to discuss pros and cons for consideration of repair. She does have reflux symptoms. Follow-up with [place] in 1 year after lung cancer screening CT. Continue Trelegy daily and as needed albuterol for COPD management.    Current Outpatient Medications:    albuterol (VENTOLIN HFA) 108 (90 Base) MCG/ACT inhaler, INHALE 1 TO 2 PUFFS INTO THE LUNGS EVERY 6 HOURS AS NEEDED FOR WHEEZING OR SHORTNESS OF BREATH, Disp: 18 g, Rfl: 11   fluticasone (FLONASE) 50 MCG/ACT nasal spray, Place 2 sprays into both nostrils daily. (Patient taking differently: Place 2 sprays into both nostrils daily as needed for allergies.), Disp: 15.8 g, Rfl: 0   Fluticasone-Umeclidin-Vilant (TRELEGY ELLIPTA) 200-62.5-25 MCG/ACT AEPB, Inhale 1 puff into the lungs daily., Disp: 180 each, Rfl: 3   ipratropium-albuterol (DUONEB) 0.5-2.5 (3) MG/3ML SOLN, Take 3 mLs by nebulization 2 (two) times daily. (Patient taking differently: Take 3 mLs by nebulization 2 (two) times daily as needed (asthma).), Disp: 360 mL, Rfl: 11   loratadine (CLARITIN) 10 MG tablet, Take 10 mg by mouth daily., Disp: , Rfl:    clonazePAM (KLONOPIN) 0.5 MG tablet, Take one tab  as needed for panic attack, Disp: 30 tablet, Rfl: 2   cyclobenzaprine (FLEXERIL) 5 MG tablet, Take 5 mg by mouth at bedtime., Disp: , Rfl:    melatonin 5 MG TABS, Take 5 mg by mouth at bedtime., Disp: , Rfl:    sertraline (ZOLOFT) 100 MG tablet, Take 1.5 tablets (150 mg total) by mouth daily., Disp: 45 tablet, Rfl: 2    Garner Nash, DO [facility] 07/14/2022 2:51 PM

## 2022-07-17 NOTE — Progress Notes (Unsigned)
[address]       [place] [address]             [id number]                    Taleen Risinger [place] Medical Record #[id number] Date of Birth: 28-Dec-1970  Referring: [name], NP Primary Care: [name], NP Primary Cardiologist: None  Chief Complaint:   No chief complaint on file.   History of Present Illness:    Vicki Mcmillan 51 y.o. female ***   This is a 51 year old female, past medical history of COPD, asthma, anxiety.  Patient was found to have a left upper lobe pulmonary nodule.  This was initially found on a lung cancer screening CT on 02/24/2022.  Patient had a subsequent nuclear medicine PET scan on 03/10/2022 left upper lobe 12 x 9 mm pulmonary nodule with an SUV max of 11.2 concerning for primary bronchogenic carcinoma.  Patient had a super D CT image on 03/24/2022 prior to bronchoscopy which showed possibly slight decrease in size of that left upper lobe nodule.  But felt that the pulmonary neoplasm could not be excluded.  She opted to move forward with bronchoscopy.  Bronchoscopy was completed on 03/25/2022.  Cytology from bronchoscopy report revealed atypical cells suggestive of noncaseating granulomatous inflammation.  Fungal cultures are still pending, aerobic and anaerobic cultures with no growth to date, acid-fast smear negative however acid-fast cultures are still in process.   OV 07/14/2022: Here today for follow-up.Patient's initial bronchoscopy and biopsy was consistent with granulomatous inflammation.  Follow-up CT imaging completed on 07/06/2022 shows only a 5 mm nodular density that is residual with its adjacent fiducial marker.  This is consistent with a benign lesion.  She does have evidence of emphysema.  She has no respiratory complaints today.  Using her Trelegy daily.  She does have reflux symptoms.  They have bothered her for some time she occasionally has some pain in her epigastrium.  CT did confirm small hiatal  hernia.  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; cervical cancer   Complication of anesthesia    COPD (chronic obstructive pulmonary disease) (HCC)    Depression    GERD (gastroesophageal reflux disease)    Headache    Myocardial infarction ([place])    Panic attacks    PONV (postoperative nausea and vomiting)     Past Surgical History:  Procedure Laterality Date   BRONCHIAL BIOPSY  03/25/2022   Procedure: BRONCHIAL BIOPSIES;  Surgeon: Garner Nash, DO;  Location: [facility];  Service: Pulmonary;;   BRONCHIAL BRUSHINGS  03/25/2022   Procedure: BRONCHIAL BRUSHINGS;  Surgeon: Garner Nash, DO;  Location: [facility];  Service: Pulmonary;;   BRONCHIAL NEEDLE ASPIRATION BIOPSY  03/25/2022   Procedure: BRONCHIAL NEEDLE ASPIRATION BIOPSIES;  Surgeon: Garner Nash, DO;  Location[facility];  Service: Pulmonary;;   TUBAL LIGATION     VIDEO BRONCHOSCOPY WITH RADIAL ENDOBRONCHIAL ULTRASOUND  03/25/2022   Procedure: RADIAL ENDOBRONCHIAL ULTRASOUND;  Surgeon: Garner Nash, DO;  Location[facility];  Service: Pulmonary;;   WRIST SURGERY      Family History  Problem Relation Age of Onset   COPD Sister  Died in her 83s from it.   COPD Maternal Aunt    COPD Father      Social History   Tobacco Use  Smoking Status Former   Packs/day: 0.25   Years: 15.00   Total pack years: 3.75   Types: Cigarettes   Quit date: 06/09/2022   Years since quitting: 0.1  Smokeless Tobacco Never  Tobacco Comments   Quit smoking cigarettes 8/72023. Patty Sermons 07/14/2022    Social History   Substance and Sexual Activity  Alcohol Use No     Allergies  Allergen Reactions   Hydrocodone-Acetaminophen Nausea And Vomiting   Tomato Rash   Chantix [Varenicline]      hallucination   Oxycodone Nausea And Vomiting and Other (See Comments)    Stomach upset   Wellbutrin [Bupropion]     hallucinations    Current Outpatient Medications  Medication Sig Dispense Refill   albuterol (VENTOLIN HFA) 108 (90 Base) MCG/ACT inhaler INHALE 1 TO 2 PUFFS INTO THE LUNGS EVERY 6 HOURS AS NEEDED FOR WHEEZING OR SHORTNESS OF BREATH 18 g 11   clonazePAM (KLONOPIN) 0.5 MG tablet Take one tab as needed for panic attack 30 tablet 2   cyclobenzaprine (FLEXERIL) 5 MG tablet Take 5 mg by mouth at bedtime.     fluticasone (FLONASE) 50 MCG/ACT nasal spray Place 2 sprays into both nostrils daily. (Patient taking differently: Place 2 sprays into both nostrils daily as needed for allergies.) 15.8 g 0   Fluticasone-Umeclidin-Vilant (TRELEGY ELLIPTA) 200-62.5-25 MCG/ACT AEPB Inhale 1 puff into the lungs daily. 180 each 3   ipratropium-albuterol (DUONEB) 0.5-2.5 (3) MG/3ML SOLN Take 3 mLs by nebulization 2 (two) times daily. (Patient taking differently: Take 3 mLs by nebulization 2 (two) times daily as needed (asthma).) 360 mL 11   loratadine (CLARITIN) 10 MG tablet Take 10 mg by mouth daily.     melatonin 5 MG TABS Take 5 mg by mouth at bedtime.     sertraline (ZOLOFT) 100 MG tablet Take 1.5 tablets (150 mg total) by mouth daily. 45 tablet 2   No current facility-administered medications for this visit.    ROS   PHYSICAL EXAMINATION: There were no vitals taken for this visit. Physical Exam  Diagnostic Studies & Laboratory data:    CT Scan:  Mediastinum/Nodes: No mediastinal or axillary lymphadenopathy. Evaluation of the hila is limited due to lack of IV contrast. The thyroid gland, trachea, and esophagus are within normal limits. There is a small hiatal hernia.  EGD: *** Manometry: *** Path: ***      I have independently reviewed the above radiology studies  and reviewed the findings with the patient.   Recent Lab Findings: Lab Results  Component Value Date   WBC  4.6 03/25/2022   HGB 14.3 03/25/2022   HCT 42.3 03/25/2022   PLT 202 03/25/2022   GLUCOSE 93 03/25/2022   CHOL 181 09/09/2018   TRIG 86 09/09/2018   HDL 72 09/09/2018   LDLCALC 92 09/09/2018   ALT 18 07/15/2019   AST 20 07/15/2019   NA 139 03/25/2022   K 4.3 03/25/2022   CL 107 03/25/2022   CREATININE 0.91 03/25/2022   BUN 20 03/25/2022   CO2 23 03/25/2022   INR 1.07 09/08/2018      Assessment / Plan:   51yo female with small hiatal hernia.  Currently not on any reflux medication.  Will refer to GI.     I  spent {CHL ONC TIME VISIT - DPOEU:[id number]} with  the  patient face to face and greater then 50% of the time was spent in counseling and coordination of care.    Lajuana Matte 07/17/2022 3:31 PM

## 2022-07-18 ENCOUNTER — Institutional Professional Consult (permissible substitution): Payer: Medicare HMO | Admitting: Thoracic Surgery (Cardiothoracic Vascular Surgery)

## 2022-07-18 VITALS — BP 108/69 | HR 90 | Resp 20 | Ht 61.0 in | Wt 129.0 lb

## 2022-07-18 DIAGNOSIS — K449 Diaphragmatic hernia without obstruction or gangrene: Secondary | ICD-10-CM | POA: Diagnosis not present

## 2022-07-28 ENCOUNTER — Encounter: Payer: Self-pay | Admitting: Gastroenterology

## 2022-08-03 ENCOUNTER — Other Ambulatory Visit (HOSPITAL_COMMUNITY): Payer: Self-pay | Admitting: Psychiatry

## 2022-08-03 DIAGNOSIS — F419 Anxiety disorder, unspecified: Secondary | ICD-10-CM

## 2022-08-04 ENCOUNTER — Other Ambulatory Visit (HOSPITAL_COMMUNITY): Payer: Self-pay | Admitting: Psychiatry

## 2022-08-04 DIAGNOSIS — F419 Anxiety disorder, unspecified: Secondary | ICD-10-CM

## 2022-08-04 DIAGNOSIS — F331 Major depressive disorder, recurrent, moderate: Secondary | ICD-10-CM

## 2022-08-08 ENCOUNTER — Ambulatory Visit: Payer: Medicare HMO | Admitting: Gastroenterology

## 2022-08-08 ENCOUNTER — Encounter: Payer: Self-pay | Admitting: Gastroenterology

## 2022-08-08 VITALS — BP 116/68 | HR 101 | Ht 61.0 in | Wt 128.5 lb

## 2022-08-08 DIAGNOSIS — K219 Gastro-esophageal reflux disease without esophagitis: Secondary | ICD-10-CM

## 2022-08-08 DIAGNOSIS — K449 Diaphragmatic hernia without obstruction or gangrene: Secondary | ICD-10-CM

## 2022-08-08 DIAGNOSIS — K5900 Constipation, unspecified: Secondary | ICD-10-CM | POA: Diagnosis not present

## 2022-08-08 DIAGNOSIS — R1013 Epigastric pain: Secondary | ICD-10-CM

## 2022-08-08 DIAGNOSIS — R1032 Left lower quadrant pain: Secondary | ICD-10-CM | POA: Diagnosis not present

## 2022-08-08 MED ORDER — CLENPIQ 10-3.5-12 MG-GM -GM/175ML PO SOLN: 1.0000 | Freq: Once | ORAL | 0 refills | Status: AC

## 2022-08-08 NOTE — Patient Instructions (Addendum)
You have been scheduled for an endoscopy and colonoscopy. Please follow the written instructions given to you at your visit today. Please pick up your prep supplies at the pharmacy within the next 1-3 days. If you use inhalers (even only as needed), please bring them with you on the day of your procedure.   We have sent the following medications to your pharmacy for you to pick up at your convenience: Clenpiq  Take Miralax 1 capful daily with 8 ounces of water.  Due to recent changes in healthcare laws, you may see the results of your imaging and laboratory studies on MyChart before your provider has had a chance to review them.  We understand that in some cases there may be results that are confusing or concerning to you. Not all laboratory results come back in the same time frame and the provider may be waiting for multiple results in order to interpret others.  Please give [place] 48 hours in order for your provider to thoroughly review all the results before contacting the office for clarification of your results.     Thank you for choosing me and [facility].  Vito Cirigliano, D.O.

## 2022-08-08 NOTE — Progress Notes (Signed)
Chief Complaint: GERD, hiatal hernia, regurgitation, dysphagia, belching   Referring Provider:     Lajuana Matte, MD   HPI:     Vicki Mcmillan is a 51 y.o. female with a history of COPD with emphysema, tobacco use disorder (quit 06/2022), asthma, anxiety referred to the Gastroenterology Clinic for evaluation of GERD, regurgitation, belching, dysphagia, and hiatal hernia.  She was seen by Dr. Kipp Brood in [facility] on 07/18/2022.  Recent CT chest on 07/04/2022 with small hiatal hernia.  Referred to the GI clinic for additional evaluation.  She has had reflux sxs for a few years, but progressively worse over the last year. Will use Tums prn. No prior PPI, H2RA that she knows.   "Raw" feeling in MEG.  Can now occur independent of meals.  Intermittent dysphagia, pointing to same area.  Separately with constipation for "my entire life". Will have BM every 2-3 days. +straining. No hematochezia. Can go up to 1 week w/o BM. Sxs seem to be worsening with associated lower abdominal cramping.    Endoscopic History: - EGD (04/22/2016, [facility]): Normal per report - Colonoscopy (05/07/2016, [facility]. Indication diarrhea): Normal per report (no bxs).  Recommend continued repeat   Past Medical History:  Diagnosis Date   Anxiety    Asthma    Cancer ([place])    squamous skin cancer on arm; cervical cancer   Complication of anesthesia    COPD (chronic obstructive pulmonary disease) (HCC)    Depression    GERD (gastroesophageal reflux disease)    Headache    Myocardial infarction ([place])    Panic attacks    PONV (postoperative nausea and vomiting)      Past Surgical History:  Procedure Laterality Date   BRONCHIAL BIOPSY  03/25/2022   Procedure: BRONCHIAL BIOPSIES;  Surgeon: Garner Nash, DO;  Location: [place];  Service: Pulmonary;;   BRONCHIAL BRUSHINGS  03/25/2022   Procedure: BRONCHIAL BRUSHINGS;  Surgeon: Garner Nash, DO;  Location: [place];   Service: Pulmonary;;   BRONCHIAL NEEDLE ASPIRATION BIOPSY  03/25/2022   Procedure: BRONCHIAL NEEDLE ASPIRATION BIOPSIES;  Surgeon: Garner Nash, DO;  Location: [facility];  Service: Pulmonary;;   TUBAL LIGATION     VIDEO BRONCHOSCOPY WITH RADIAL ENDOBRONCHIAL ULTRASOUND  03/25/2022   Procedure: RADIAL ENDOBRONCHIAL ULTRASOUND;  Surgeon: Garner Nash, DO;  Location: [facility];  Service: Pulmonary;;   WRIST SURGERY     Family History  Problem Relation Age of Onset   COPD Sister        Died in her 75s from it.   COPD Maternal Aunt    COPD Father    Social History   Tobacco Use   Smoking status: Former    Packs/day: 0.25    Years: 15.00    Total pack years: 3.75    Types: Cigarettes    Quit date: 06/09/2022    Years since quitting: 0.1   Smokeless tobacco: Never   Tobacco comments:    Quit smoking cigarettes 8/72023. Tay 07/14/2022  Vaping Use   Vaping Use: Former  Substance Use Topics   Alcohol use: No   Drug use: No   Current Outpatient Medications  Medication Sig Dispense Refill   albuterol (VENTOLIN HFA) 108 (90 Base) MCG/ACT inhaler INHALE 1 TO 2 PUFFS INTO THE LUNGS EVERY 6 HOURS AS NEEDED FOR WHEEZING OR SHORTNESS OF BREATH 18 g 11   clonazePAM (KLONOPIN) 0.5  MG tablet Take one tab as needed for panic attack 30 tablet 2   cyclobenzaprine (FLEXERIL) 5 MG tablet Take 5 mg by mouth at bedtime.     fluticasone (FLONASE) 50 MCG/ACT nasal spray Place 2 sprays into both nostrils daily. (Patient taking differently: Place 2 sprays into both nostrils daily as needed for allergies.) 15.8 g 0   Fluticasone-Umeclidin-Vilant (TRELEGY ELLIPTA) 200-62.5-25 MCG/ACT AEPB Inhale 1 puff into the lungs daily. 180 each 3   ipratropium-albuterol (DUONEB) 0.5-2.5 (3) MG/3ML SOLN Take 3 mLs by nebulization 2 (two) times daily. (Patient taking differently: Take 3 mLs by nebulization 2 (two) times daily as needed (asthma).) 360 mL 11   loratadine (CLARITIN) 10 MG tablet Take 10 mg by mouth  daily.     melatonin 5 MG TABS Take 5 mg by mouth at bedtime.     sertraline (ZOLOFT) 100 MG tablet Take 1.5 tablets (150 mg total) by mouth daily. 45 tablet 2   No current facility-administered medications for this visit.   Allergies  Allergen Reactions   Hydrocodone-Acetaminophen Nausea And Vomiting   Tomato Rash   Chantix [Varenicline]     hallucination   Oxycodone Nausea And Vomiting and Other (See Comments)    Stomach upset   Wellbutrin [Bupropion]     hallucinations     Review of Systems: All systems reviewed and negative except where noted in HPI.     Physical Exam:    Wt Readings from Last 3 Encounters:  08/08/22 128 lb 8 oz (58.3 kg)  07/18/22 129 lb (58.5 kg)  07/14/22 129 lb 6.4 oz (58.7 kg)    BP 116/68   Pulse (!) 101   Ht '5\' 1"'$  (1.549 m)   Wt 128 lb 8 oz (58.3 kg)   BMI 24.28 kg/m  Constitutional:  Pleasant, in no acute distress. Psychiatric: Normal mood and affect. Behavior is normal. Cardiovascular: Normal rate, regular rhythm. No edema Pulmonary/chest: Effort normal and breath sounds normal. No wheezing, rales or rhonchi. Abdominal: Soft, nondistended, nontender. Bowel sounds active throughout. There are no masses palpable. No hepatomegaly. Neurological: Alert and oriented to person place and time. Skin: Skin is warm and dry. No rashes noted.   ASSESSMENT AND PLAN;   1) GERD 2) Epigastric pain 3) Dysphagia 4) Hiatal hernia - EGD to evaluate for erosive esophagitis along with LES laxity and size/grade of hiatal hernia - Discussed role/utility of trial of high-dose PPI for diagnostic and therapeutic intent.  Prefers to hold off until after EGD - Continue antireflux lifestyle/dietary modifications in the interim  5) Change in bowel habits 6) Constipation 7) Lower abdominal pain - Start MiraLAX 1 cap/day for goal of soft stools without straining to BM - Colonoscopy to evaluate for medical/luminal pathology - Extended 2-day bowel  perforation  The indications, risks, and benefits of EGD and colonoscopy were explained to the patient in detail. Risks include but are not limited to bleeding, perforation, adverse reaction to medications, and cardiopulmonary compromise. Sequelae include but are not limited to the possibility of surgery, hospitalization, and mortality. The patient verbalized understanding and wished to proceed. All questions answered, referred to scheduler and bowel prep ordered. Further recommendations pending results of the exam.     Lavena Bullion, DO, FACG  08/08/2022, 11:05 AM   Lightfoot, Lucile Crater, MD

## 2022-08-09 ENCOUNTER — Encounter: Payer: Self-pay | Admitting: Certified Registered Nurse Anesthetist

## 2022-08-11 ENCOUNTER — Ambulatory Visit (AMBULATORY_SURGERY_CENTER): Payer: Medicare HMO | Admitting: Gastroenterology

## 2022-08-11 ENCOUNTER — Encounter: Payer: Self-pay | Admitting: Gastroenterology

## 2022-08-11 VITALS — BP 100/52 | HR 79 | Temp 97.8°F | Resp 17 | Ht 61.0 in | Wt 128.0 lb

## 2022-08-11 DIAGNOSIS — K5900 Constipation, unspecified: Secondary | ICD-10-CM | POA: Diagnosis not present

## 2022-08-11 DIAGNOSIS — K449 Diaphragmatic hernia without obstruction or gangrene: Secondary | ICD-10-CM | POA: Diagnosis not present

## 2022-08-11 DIAGNOSIS — R1319 Other dysphagia: Secondary | ICD-10-CM

## 2022-08-11 DIAGNOSIS — K2100 Gastro-esophageal reflux disease with esophagitis, without bleeding: Secondary | ICD-10-CM | POA: Diagnosis not present

## 2022-08-11 DIAGNOSIS — R1032 Left lower quadrant pain: Secondary | ICD-10-CM

## 2022-08-11 DIAGNOSIS — K319 Disease of stomach and duodenum, unspecified: Secondary | ICD-10-CM

## 2022-08-11 DIAGNOSIS — R1013 Epigastric pain: Secondary | ICD-10-CM

## 2022-08-11 DIAGNOSIS — D128 Benign neoplasm of rectum: Secondary | ICD-10-CM

## 2022-08-11 DIAGNOSIS — K621 Rectal polyp: Secondary | ICD-10-CM

## 2022-08-11 HISTORY — PX: COLONOSCOPY WITH ESOPHAGOGASTRODUODENOSCOPY (EGD): SHX5779

## 2022-08-11 MED ORDER — SODIUM CHLORIDE 0.9 % IV SOLN: 500.0000 mL | INTRAVENOUS | Status: DC

## 2022-08-11 MED ORDER — PANTOPRAZOLE SODIUM 40 MG PO TBEC: 40.0000 mg | DELAYED_RELEASE_TABLET | Freq: Two times a day (BID) | ORAL | 4 refills | Status: DC

## 2022-08-11 NOTE — Progress Notes (Signed)
GASTROENTEROLOGY PROCEDURE H&P NOTE   Primary Care Physician: [name], NP    Reason for Procedure:  GERD, hiatal hernia, regurgitation, dysphagia, epigastric pain, belching, change in bowel habits, constipation, lower abdominal pain  Plan:    EGD, colonoscopy  Patient is appropriate for endoscopic procedure(s) in the ambulatory ([place]) setting.  The nature of the procedure, as well as the risks, benefits, and alternatives were carefully and thoroughly reviewed with the patient. Ample time for discussion and questions allowed. The patient understood, was satisfied, and agreed to proceed.     HPI: Vicki Mcmillan is a 51 y.o. female who presents for EGD for evaluation of GERD, epigastric pain, dysphagia, hiatal hernia along with colonoscopy for evaluation of change in bowel habits, constipation, lower abdominal pain.  Patient was most recently seen in the Gastroenterology Clinic on 08/08/2022 by me.  No interval change in medical history since that appointment. Please refer to that note for full details regarding GI history and clinical presentation.   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; cervical cancer   Complication of anesthesia    COPD (chronic obstructive pulmonary disease) (HCC)    Depression    GERD (gastroesophageal reflux disease)    Headache    Myocardial infarction ([place])    Panic attacks    PONV (postoperative nausea and vomiting)     Past Surgical History:  Procedure Laterality Date   BRONCHIAL BIOPSY  03/25/2022   Procedure: BRONCHIAL BIOPSIES;  Surgeon: Garner Nash, DO;  Location: [facility] [facility];  Service: Pulmonary;;   BRONCHIAL BRUSHINGS  03/25/2022   Procedure: BRONCHIAL BRUSHINGS;  Surgeon: Garner Nash, DO;  Location: [facility];  Service: Pulmonary;;   BRONCHIAL NEEDLE ASPIRATION BIOPSY  03/25/2022   Procedure: BRONCHIAL NEEDLE ASPIRATION BIOPSIES;  Surgeon: Garner Nash, DO;  Location: [place] [facility];  Service: Pulmonary;;   TUBAL LIGATION     VIDEO BRONCHOSCOPY WITH RADIAL ENDOBRONCHIAL ULTRASOUND  03/25/2022   Procedure: RADIAL ENDOBRONCHIAL ULTRASOUND;  Surgeon: Garner Nash, DO;  Location: [facility];  Service: Pulmonary;;   WRIST SURGERY      Prior to Admission medications   Medication Sig Start Date End Date Taking? Authorizing Provider  albuterol (VENTOLIN HFA) 108 (90 Base) MCG/ACT inhaler INHALE 1 TO 2 PUFFS INTO THE LUNGS EVERY 6 HOURS AS NEEDED FOR WHEEZING OR SHORTNESS OF BREATH 05/22/22   Mannam, Hart Robinsons, MD  clonazePAM (KLONOPIN) 0.5 MG tablet Take one tab as needed for panic attack 03/17/22   Arfeen, Arlyce Harman, MD  cyclobenzaprine (FLEXERIL) 5 MG tablet Take 5 mg by mouth at bedtime. 12/11/20   [provider]  fluticasone (FLONASE) 50 MCG/ACT nasal spray Place 2 sprays into both nostrils daily. Patient taking differently: Place 2 sprays into both nostrils daily as needed for allergies. 10/18/19   Tedd Sias, PA  Fluticasone-Umeclidin-Vilant (TRELEGY ELLIPTA) 200-62.5-25 MCG/ACT AEPB Inhale 1 puff into the lungs daily. 07/01/22   Mannam, Hart Robinsons, MD  ipratropium-albuterol (DUONEB) 0.5-2.5 (3) MG/3ML SOLN Take 3 mLs by nebulization 2 (two) times daily. Patient taking differently: Take 3 mLs by nebulization 2 (two) times daily as needed (asthma). 01/15/22   Mannam, Hart Robinsons, MD  loratadine (CLARITIN) 10 MG tablet Take 10 mg by mouth daily.    [provider]  melatonin 5 MG TABS Take 5 mg by mouth at bedtime.    [provider]  sertraline (  ZOLOFT) 100 MG tablet Take 1.5 tablets (150 mg total) by mouth daily. 06/16/22   Arfeen, Arlyce Harman, MD    Current Outpatient Medications  Medication Sig Dispense Refill   albuterol (VENTOLIN HFA) 108 (90 Base) MCG/ACT inhaler INHALE 1 TO 2 PUFFS INTO THE LUNGS EVERY 6 HOURS AS NEEDED FOR WHEEZING OR SHORTNESS OF BREATH 18 g 11   clonazePAM (KLONOPIN) 0.5 MG tablet Take one tab  as needed for panic attack 30 tablet 2   cyclobenzaprine (FLEXERIL) 5 MG tablet Take 5 mg by mouth at bedtime.     fluticasone (FLONASE) 50 MCG/ACT nasal spray Place 2 sprays into both nostrils daily. (Patient taking differently: Place 2 sprays into both nostrils daily as needed for allergies.) 15.8 g 0   Fluticasone-Umeclidin-Vilant (TRELEGY ELLIPTA) 200-62.5-25 MCG/ACT AEPB Inhale 1 puff into the lungs daily. 180 each 3   ipratropium-albuterol (DUONEB) 0.5-2.5 (3) MG/3ML SOLN Take 3 mLs by nebulization 2 (two) times daily. (Patient taking differently: Take 3 mLs by nebulization 2 (two) times daily as needed (asthma).) 360 mL 11   loratadine (CLARITIN) 10 MG tablet Take 10 mg by mouth daily.     melatonin 5 MG TABS Take 5 mg by mouth at bedtime.     sertraline (ZOLOFT) 100 MG tablet Take 1.5 tablets (150 mg total) by mouth daily. 45 tablet 2   Current Facility-Administered Medications  Medication Dose Route Frequency Provider Last Rate Last Admin   0.9 %  sodium chloride infusion  500 mL Intravenous Continuous Eleora Sutherland V, DO        Allergies as of 08/11/2022 - Review Complete 08/11/2022  Allergen Reaction Noted   Hydrocodone-acetaminophen Nausea And Vomiting 05/15/2015   Tomato Rash 12/24/2018   Chantix [varenicline]  05/11/2016   Oxycodone Nausea And Vomiting and Other (See Comments) 07/27/2017   Wellbutrin [bupropion]  05/11/2016    Family History  Problem Relation Age of Onset   COPD Sister        Died in her 63s from it.   COPD Maternal Aunt    COPD Father     Social History   Socioeconomic History   Marital status: Legally Separated    Spouse name: Not on file   Number of children: 1   Years of education: Not on file   Highest education level: Not on file  Occupational History   Not on file  Tobacco Use   Smoking status: Former    Packs/day: 0.25    Years: 15.00    Total pack years: 3.75    Types: Cigarettes    Quit date: 06/09/2022    Years since quitting:  0.1   Smokeless tobacco: Never   Tobacco comments:    Quit smoking cigarettes 8/72023. Tay 07/14/2022  Vaping Use   Vaping Use: Former  Substance and Sexual Activity   Alcohol use: No   Drug use: No   Sexual activity: Yes    Birth control/protection: None  Other Topics Concern   Not on file  Social History Narrative   Not on file   Social Determinants of Health   Financial Resource Strain: High Risk (12/21/2018)   Overall Financial Resource Strain (CARDIA)    Difficulty of Paying Living Expenses: Very hard  Food Insecurity: Food Insecurity Present (12/21/2018)   Hunger Vital Sign    Worried About Running Out of Food in the Last Year: Sometimes true    Ran Out of Food in the Last Year: Sometimes true  Transportation Needs: Unmet Transportation  Needs (12/21/2018)   PRAPARE - Hydrologist (Medical): Yes    Lack of Transportation (Non-Medical): Yes  Physical Activity: Inactive (12/21/2018)   Exercise Vital Sign    Days of Exercise per Week: 0 days    Minutes of Exercise per Session: 0 min  Stress: Stress Concern Present (12/21/2018)   [place]    Feeling of Stress : Very much  Social Connections: Moderately Isolated (12/21/2018)   Social Connection and Isolation Panel [NHANES]    Frequency of Communication with Friends and Family: Never    Frequency of Social Gatherings with Friends and Family: More than three times a week    Attends Religious Services: Never    Marine scientist or Organizations: No    Attends Archivist Meetings: Never    Marital Status: Separated  Intimate Partner Violence: Not At Risk (12/21/2018)   Humiliation, Afraid, Rape, and Kick questionnaire    Fear of Current or Ex-Partner: No    Emotionally Abused: No    Physically Abused: No    Sexually Abused: No    Physical Exam: Vital signs in last 24 hours: '@BP'$  94/60   Pulse 94   Temp 97.8 F  (36.6 C)   Ht '5\' 1"'$  (1.549 m)   Wt 128 lb (58.1 kg)   SpO2 92%   BMI 24.19 kg/m  GEN: NAD EYE: Sclerae anicteric ENT: MMM CV: Non-tachycardic Pulm: CTA b/l GI: Soft, NT/ND NEURO:  Alert & Oriented x 3   Gerrit Heck, DO [facility]   08/11/2022 2:11 PM

## 2022-08-11 NOTE — Progress Notes (Signed)
1518 Ephedrine 10 mg given IV due to low BP, MD updated.

## 2022-08-11 NOTE — Op Note (Signed)
[facility] Patient Name: Vicki Mcmillan Procedure Date: 08/11/2022 2:46 PM MRN: [id number] Endoscopist: Gerrit Heck , MD Age: 51 Referring MD:  Date of Birth: 11/05/1970 Gender: Female Account #: [id number] Procedure:                Upper GI endoscopy Indications:              Epigastric abdominal pain, Dysphagia, Heartburn,                            Suspected esophageal reflux Medicines:                Monitored Anesthesia Care Procedure:                Pre-Anesthesia Assessment:                           - Prior to the procedure, a History and Physical                            was performed, and patient medications and                            allergies were reviewed. The patient's tolerance of                            previous anesthesia was also reviewed. The risks                            and benefits of the procedure and the sedation                            options and risks were discussed with the patient.                            All questions were answered, and informed consent                            was obtained. Prior Anticoagulants: The patient has                            taken no previous anticoagulant or antiplatelet                            agents. ASA Grade Assessment: III - A patient with                            severe systemic disease. After reviewing the risks                            and benefits, the patient was deemed in                            satisfactory condition to undergo the procedure.  After obtaining informed consent, the endoscope was                            passed under direct vision. Throughout the                            procedure, the patient's blood pressure, pulse, and                            oxygen saturations were monitored continuously. The                            GIF [id number] #[id number] was introduced through the                            mouth, and advanced to the  second part of duodenum.                            The upper GI endoscopy was accomplished without                            difficulty. The patient tolerated the procedure                            well. Scope In: Scope Out: Findings:                 LA Grade A (one or more mucosal breaks less than 5                            mm, not extending between tops of 2 mucosal folds)                            esophagitis with no bleeding was found at the                            gastroesophageal junction.                           The upper third of the esophagus and middle third                            of the esophagus were normal. A guidewire was                            placed and the scope was withdrawn. Dilation was                            performed with a Savary dilator with no resistance                            at 17 mm. The dilation site was examined following  endoscope reinsertion and showed no bleeding,                            mucosal tear or perforation. Estimated blood loss:                            none.                           The gastroesophageal flap valve was visualized                            endoscopically and classified as Hill Grade III                            (minimal fold, loose to endoscope, hiatal hernia                            likely).                           A very small, <1 cm, sliding type hiatal hernia was                            present.                           The entire examined stomach was normal. Biopsies                            were taken with a cold forceps for Helicobacter                            pylori testing. Estimated blood loss was minimal.                           The examined duodenum was normal. Complications:            No immediate complications. Estimated Blood Loss:     Estimated blood loss was minimal. Impression:               - LA Grade A reflux esophagitis with no  bleeding.                           - Normal upper third of esophagus and middle third                            of esophagus. Dilated with 17 mm Savary dilator.                           - Gastroesophageal flap valve classified as Hill                            Grade III (minimal fold, loose to endoscope, hiatal  hernia likely).                           - <1 cm, sliding type hiatal hernia.                           - Normal stomach. Biopsied.                           - Normal examined duodenum. Recommendation:           - Patient has a contact number available for                            emergencies. The signs and symptoms of potential                            delayed complications were discussed with the                            patient. Return to normal activities tomorrow.                            Written discharge instructions were provided to the                            patient.                           - Resume previous diet.                           - Continue present medications.                           - Await pathology results.                           - Repeat upper endoscopy PRN.                           - Use Protonix (pantoprazole) 40 mg PO BID for 4                            weeks then reduce to 40 mg daily.                           - If continued symptoms, will plan on Esophageal                            Manometry and pH/Impedance testing.                           - Perform a colonoscopy today. Gerrit Heck, MD 08/11/2022 3:27:37 PM

## 2022-08-11 NOTE — Progress Notes (Signed)
1500 Ephedrine 10 mg given IV due to low BP, MD updated.

## 2022-08-11 NOTE — Op Note (Signed)
[place] Patient Name: Vicki Mcmillan Procedure Date: 08/11/2022 2:46 PM MRN: [id number] Endoscopist: Gerrit Heck , MD Age: 51 Referring MD:  Date of Birth: [birth year] Gender: Female Account #: [id number] Procedure:                Colonoscopy Indications:              Lower abdominal pain, Change in bowel habits,                            Constipation Medicines:                Monitored Anesthesia Care Procedure:                Pre-Anesthesia Assessment:                           - Prior to the procedure, a History and Physical                            was performed, and patient medications and                            allergies were reviewed. The patient's tolerance of                            previous anesthesia was also reviewed. The risks                            and benefits of the procedure and the sedation                            options and risks were discussed with the patient.                            All questions were answered, and informed consent                            was obtained. Prior Anticoagulants: The patient has                            taken no previous anticoagulant or antiplatelet                            agents. ASA Grade Assessment: III - A patient with                            severe systemic disease. After reviewing the risks                            and benefits, the patient was deemed in                            satisfactory condition to undergo the procedure.  After obtaining informed consent, the colonoscope                            was passed under direct vision. Throughout the                            procedure, the patient's blood pressure, pulse, and                            oxygen saturations were monitored continuously. The                            Olympus PCF-H190DL (#[id number]) Colonoscope was                            introduced through the anus and advanced to the  the                            terminal ileum. The colonoscopy was performed                            without difficulty. The patient tolerated the                            procedure well. The quality of the bowel                            preparation was good. The terminal ileum, ileocecal                            valve, appendiceal orifice, and rectum were                            photographed. Scope In: 2:57:18 PM Scope Out: 3:20:50 PM Scope Withdrawal Time: 0 hours 20 minutes 1 second  Total Procedure Duration: 0 hours 23 minutes 32 seconds  Findings:                 The perianal and digital rectal examinations were                            normal.                           The colon appeared normal throughout. No areas of                            mucosal erythema, edema, erosions, or ulceration,                            and no luminal narrowing or stricture.                           Two sessile polyps were found in the rectum. The  polyps were 2 to 3 mm in size. These polyps were                            removed with a cold snare. Resection and retrieval                            were complete. Estimated blood loss was minimal.                           The retroflexed view of the distal rectum and anal                            verge was normal and showed no anal or rectal                            abnormalities.                           The terminal ileum appeared normal. Complications:            No immediate complications. Estimated Blood Loss:     Estimated blood loss was minimal. Impression:               - The entire examined colon is normal.                           - Two 2 to 3 mm polyps in the rectum, removed with                            a cold snare. Resected and retrieved.                           - The distal rectum and anal verge are normal on                            retroflexion view.                            - The examined portion of the ileum was normal. Recommendation:           - Patient has a contact number available for                            emergencies. The signs and symptoms of potential                            delayed complications were discussed with the                            patient. Return to normal activities tomorrow.                            Written discharge instructions were provided to the  patient.                           - Resume previous diet.                           - Continue present medications.                           - Await pathology results.                           - Repeat colonoscopy for surveillance based on                            pathology results.                           - If lower GI symptoms persist, will plan for                            Anorectal Manometry.                           - Return to GI office PRN. Gerrit Heck, MD 08/11/2022 3:31:37 PM

## 2022-08-11 NOTE — Patient Instructions (Signed)
Please read handouts provided. Continue present medications. Await pathology results. Repeat upper endoscopy as needed. Resume previous diet. Protonix ( pantoprazole ) 40 mg twice daily for 4 weeks, then reduce to 40 mg daily. Return to GI office as needed.   YOU HAD AN ENDOSCOPIC PROCEDURE TODAY AT [facility]:   Refer to the procedure report that was given to you for any specific questions about what was found during the examination.  If the procedure report does not answer your questions, please call your gastroenterologist to clarify.  If you requested that your care partner not be given the details of your procedure findings, then the procedure report has been included in a sealed envelope for you to review at your convenience later.  YOU SHOULD EXPECT: Some feelings of bloating in the abdomen. Passage of more gas than usual.  Walking can help get rid of the air that was put into your GI tract during the procedure and reduce the bloating. If you had a lower endoscopy (such as a colonoscopy or flexible sigmoidoscopy) you may notice spotting of blood in your stool or on the toilet paper. If you underwent a bowel prep for your procedure, you may not have a normal bowel movement for a few days.  Please Note:  You might notice some irritation and congestion in your nose or some drainage.  This is from the oxygen used during your procedure.  There is no need for concern and it should clear up in a day or so.  SYMPTOMS TO REPORT IMMEDIATELY:  Following lower endoscopy (colonoscopy or flexible sigmoidoscopy):  Excessive amounts of blood in the stool  Significant tenderness or worsening of abdominal pains  Swelling of the abdomen that is new, acute  Fever of 100F or higher  Following upper endoscopy (EGD)  Vomiting of blood or coffee ground material  New chest pain or pain under the shoulder blades  Painful or persistently difficult swallowing  New shortness of breath  Fever  of 100F or higher  Black, tarry-looking stools  For urgent or emergent issues, a gastroenterologist can be reached at any hour by calling [phone]. Do not use MyChart messaging for urgent concerns.    DIET:  We do recommend a small meal at first, but then you may proceed to your regular diet.  Drink plenty of fluids but you should avoid alcoholic beverages for 24 hours.  ACTIVITY:  You should plan to take it easy for the rest of today and you should NOT DRIVE or use heavy machinery until tomorrow (because of the sedation medicines used during the test).    FOLLOW UP: Our staff will call the number listed on your records the next business day following your procedure.  We will call around 7:15- 8:00 am to check on you and address any questions or concerns that you may have regarding the information given to you following your procedure. If we do not reach you, we will leave a message.     If any biopsies were taken you will be contacted by phone or by letter within the next 1-3 weeks.  Please call us at [phone] if you have not heard about the biopsies in 3 weeks.    SIGNATURES/CONFIDENTIALITY: You and/or your care partner have signed paperwork which will be entered into your electronic medical record.  These signatures attest to the fact that that the information above on your After Visit Summary has been reviewed and is understood.  Full responsibility of  the confidentiality of this discharge information lies with you and/or your care-partner.

## 2022-08-11 NOTE — Progress Notes (Signed)
Report given to PACU, vss 

## 2022-08-11 NOTE — Progress Notes (Signed)
Called to room to assist during endoscopic procedure.  Patient ID and intended procedure confirmed with present staff. Received instructions for my participation in the procedure from the performing physician.  

## 2022-08-12 ENCOUNTER — Telehealth: Payer: Self-pay

## 2022-08-12 NOTE — Telephone Encounter (Signed)
  Follow up Call-     08/11/2022    2:06 PM  Call back number  Post procedure Call Back phone  # [phone]  Permission to leave phone message Yes     Patient questions:  Do you have a fever, pain , or abdominal swelling? No. Pain Score  0 *  Have you tolerated food without any problems? Yes.    Have you been able to return to your normal activities? Yes.    Do you have any questions about your discharge instructions: Diet   No. Medications  No. Follow up visit  No.  Do you have questions or concerns about your Care? No.  Actions: * If pain score is 4 or above: No action needed, pain <4.

## 2022-08-22 ENCOUNTER — Ambulatory Visit (INDEPENDENT_AMBULATORY_CARE_PROVIDER_SITE_OTHER): Payer: Medicare HMO | Admitting: Thoracic Surgery (Cardiothoracic Vascular Surgery)

## 2022-08-22 DIAGNOSIS — K449 Diaphragmatic hernia without obstruction or gangrene: Secondary | ICD-10-CM | POA: Diagnosis not present

## 2022-08-22 NOTE — Progress Notes (Signed)
     [address]       [place] [address]             [phone]       Patient: Home Provider: Office Consent for Telemedicine visit obtained.  Today's visit was completed via a real-time telehealth (see specific modality noted below). The patient/authorized person provided oral consent at the time of the visit to engage in a telemedicine encounter with the present provider at [facility] [employer]. The patient/authorized person was informed of the potential benefits, limitations, and risks of telemedicine. The patient/authorized person expressed understanding that the laws that protect confidentiality also apply to telemedicine. The patient/authorized person acknowledged understanding that telemedicine does not provide emergency services and that he or she would need to call 911 or proceed to the nearest hospital for help if such a need arose.   Total time spent in the clinical discussion 10 minutes.  Telehealth Modality: Phone visit (audio only)  I had a telephone visit with Vicki Mcmillan.  She was referred to gastroenterology and underwent an upper endoscopy which showed a small hiatal hernia and grade a esophagitis.  She has been placed on Protonix with some relief.  She continues to have some regurgitation but it is not burning in sensation.  She will follow-up as needed.

## 2022-09-15 ENCOUNTER — Encounter (HOSPITAL_COMMUNITY): Payer: Self-pay | Admitting: Psychiatry

## 2022-09-15 ENCOUNTER — Telehealth (HOSPITAL_BASED_OUTPATIENT_CLINIC_OR_DEPARTMENT_OTHER): Payer: Medicare HMO | Admitting: Psychiatry

## 2022-09-15 DIAGNOSIS — F331 Major depressive disorder, recurrent, moderate: Secondary | ICD-10-CM | POA: Diagnosis not present

## 2022-09-15 DIAGNOSIS — F419 Anxiety disorder, unspecified: Secondary | ICD-10-CM | POA: Diagnosis not present

## 2022-09-15 MED ORDER — SERTRALINE HCL 100 MG PO TABS: 150.0000 mg | ORAL_TABLET | Freq: Every day | ORAL | 2 refills | Status: DC

## 2022-09-15 NOTE — Progress Notes (Signed)
Virtual Visit via Telephone Note  I connected with Vicki Mcmillan on 09/15/22 at  3:40 PM EST by telephone and verified that I am speaking with the correct person using two identifiers.  Location: Patient: Home Provider: Home Office   I discussed the limitations, risks, security and privacy concerns of performing an evaluation and management service by telephone and the availability of in person appointments. I also discussed with the patient that there may be a patient responsible charge related to this service. The patient expressed understanding and agreed to proceed.   History of Present Illness: Patient is evaluated by phone session.  She is doing well on her Zoloft.  She has not taken Klonopin recently but picked up few days ago and does not need any refill.  She is pleased that her tests are normal.  Recently she had a colonoscopy and endoscopy and she had removal of polyps but no other concerns.  Patient denies any crying spells or any feeling of hopelessness or worthlessness.  She denies any panic attack.  Her plan is to spend Thanksgiving with her brother, mother and her nephews.  She has no tremors, shakes or any EPS.  She sleeps good.  She wants to continue her Zoloft.   Past Psychiatric History:  No h/o suicidal attempt or inpatient treatment.  Saw psychiatrist after Chantix caused increased anxiety, hallucination and memory impairment.  Saw Dr. Jessy Oto at [facility] and given Zoloft Abilify with good response.  PCP prescribed Xanax.    Psychiatric Specialty Exam: Physical Exam  Review of Systems  Weight 128 lb (58.1 kg).There is no height or weight on file to calculate BMI.  General Appearance: NA  Eye Contact:  NA  Speech:  Clear and Coherent  Volume:  Normal  Mood:  Euthymic  Affect:  NA  Thought Process:  Goal Directed  Orientation:  Full (Time, Place, and Person)  Thought Content:  Logical  Suicidal Thoughts:  No  Homicidal Thoughts:  No  Memory:  Immediate;    Good Recent;   Good Remote;   Good  Judgement:  Intact  Insight:  Good  Psychomotor Activity:  NA  Concentration:  Concentration: Good and Attention Span: Good  Recall:  Good  Fund of Knowledge:  Good  Language:  Good  Akathisia:  No  Handed:  Right  AIMS (if indicated):     Assets:  Communication Skills Desire for Improvement Housing Social Support Transportation  ADL's:  Intact  Cognition:  WNL  Sleep:   ok, 8 hrs      Assessment and Plan: Major depressive disorder, recurrent.  Anxiety.  Patient is stable on Zoloft 150 mg daily.  She does not need a new prescription of Klonopin as recently picked up few days ago.  Discussed medication side effects and benefits.  Recommended to call us back if she is any question or any concern.  Follow-up in 3 months.  Follow Up Instructions:    I discussed the assessment and treatment plan with the patient. The patient was provided an opportunity to ask questions and all were answered. The patient agreed with the plan and demonstrated an understanding of the instructions.   The patient was advised to call back or seek an in-person evaluation if the symptoms worsen or if the condition fails to improve as anticipated.  Collaboration of Care: Other provider involved in patient's care AEB notes are available in epic to review.  Patient/Guardian was advised Release of Information must be obtained prior to any record  release in order to collaborate their care with an outside provider. Patient/Guardian was advised if they have not already done so to contact the registration department to sign all necessary forms in order for [place] to release information regarding their care.   Consent: Patient/Guardian gives verbal consent for treatment and assignment of benefits for services provided during this visit. Patient/Guardian expressed understanding and agreed to proceed.    I provided 18 minutes of non-face-to-face time during this encounter.   [name], MD

## 2022-11-10 ENCOUNTER — Other Ambulatory Visit (HOSPITAL_COMMUNITY): Payer: Self-pay | Admitting: Psychiatry

## 2022-11-10 DIAGNOSIS — F419 Anxiety disorder, unspecified: Secondary | ICD-10-CM

## 2022-11-13 ENCOUNTER — Telehealth (HOSPITAL_COMMUNITY): Payer: Self-pay | Admitting: *Deleted

## 2022-11-13 DIAGNOSIS — F419 Anxiety disorder, unspecified: Secondary | ICD-10-CM

## 2022-11-13 MED ORDER — CLONAZEPAM 0.5 MG PO TABS: ORAL_TABLET | ORAL | 0 refills | Status: DC

## 2022-11-13 NOTE — Telephone Encounter (Signed)
Pt called requesting a refill of Klonopin 0.5 mg used for panic attacks. Last fill was on 09/12/22 per your note on 09/15/22 for #30 at [facility] in [place]. Pt has an appointment scheduled for 12/15/22. Please review.

## 2022-11-13 NOTE — Telephone Encounter (Signed)
Prescription sent.  Informed the patient

## 2022-11-14 ENCOUNTER — Telehealth (HOSPITAL_COMMUNITY): Payer: Self-pay

## 2022-11-14 DIAGNOSIS — F419 Anxiety disorder, unspecified: Secondary | ICD-10-CM

## 2022-11-14 MED ORDER — CLONAZEPAM 0.5 MG PO TABS: 0.5000 mg | ORAL_TABLET | Freq: Every day | ORAL | 0 refills | Status: DC | PRN

## 2022-11-14 NOTE — Telephone Encounter (Signed)
Sent a new prescription.  Please inform the patient.

## 2022-11-14 NOTE — Telephone Encounter (Signed)
Pharmacy called stating that the patients Clonazepam needs specific directions it needs to say she can 1 or up to however many tablets a day please advise.

## 2022-12-07 ENCOUNTER — Other Ambulatory Visit (HOSPITAL_COMMUNITY): Payer: Self-pay | Admitting: Psychiatry

## 2022-12-07 DIAGNOSIS — F331 Major depressive disorder, recurrent, moderate: Secondary | ICD-10-CM

## 2022-12-07 DIAGNOSIS — F419 Anxiety disorder, unspecified: Secondary | ICD-10-CM

## 2022-12-15 ENCOUNTER — Encounter (HOSPITAL_COMMUNITY): Payer: Self-pay | Admitting: Psychiatry

## 2022-12-15 ENCOUNTER — Telehealth (HOSPITAL_BASED_OUTPATIENT_CLINIC_OR_DEPARTMENT_OTHER): Payer: Medicare HMO | Admitting: Psychiatry

## 2022-12-15 DIAGNOSIS — F331 Major depressive disorder, recurrent, moderate: Secondary | ICD-10-CM

## 2022-12-15 DIAGNOSIS — F419 Anxiety disorder, unspecified: Secondary | ICD-10-CM

## 2022-12-15 MED ORDER — CLONAZEPAM 0.5 MG PO TABS: 0.5000 mg | ORAL_TABLET | Freq: Every day | ORAL | 0 refills | Status: DC | PRN

## 2022-12-15 MED ORDER — SERTRALINE HCL 100 MG PO TABS: 150.0000 mg | ORAL_TABLET | Freq: Every day | ORAL | 2 refills | Status: DC

## 2022-12-15 NOTE — Progress Notes (Signed)
Virtual Visit via Telephone Note  I connected with Tabytha Cuyler on 12/15/22 at  1:20 PM EST by telephone and verified that I am speaking with the correct person using two identifiers.  Location: Patient: Home Provider: Home Office   I discussed the limitations, risks, security and privacy concerns of performing an evaluation and management service by telephone and the availability of in person appointments. I also discussed with the patient that there may be a patient responsible charge related to this service. The patient expressed understanding and agreed to proceed.   History of Present Illness: Patient is evaluated by phone session.  She is compliant with Zoloft 150 mg.  She admitted taking Klonopin 3-4 times a week because sometimes she gets very anxious.  She is not sure what causing the anxiety but admitted to feeling boredom and living most of the time inside the house.  She admitted does not go outside as much.  Patient lives with her mother.  Patient told lately mother got sick but she is pleased that she is feeling better.  Patient is on disability.  She denies any feeling of hopelessness or worthlessness.  She denies any crying spells, suicidal thoughts or any agitation.  She has no tremor or shakes or any EPS.  Recently she had a visit with primary care physician and her labs are normal.  No new medication added.  She sleeps at least 8 to 9 hours.   Past Psychiatric History:  No h/o suicidal attempt or inpatient treatment.  Saw psychiatrist after Chantix caused increased anxiety, hallucination and memory impairment.  Saw Dr. Jessy Oto at [facility] and given Zoloft Abilify with good response.  PCP prescribed Xanax.    Psychiatric Specialty Exam: Physical Exam  Review of Systems  Weight 130 lb (59 kg).There is no height or weight on file to calculate BMI.  General Appearance: NA  Eye Contact:  NA  Speech:  Clear and Coherent  Volume:  Normal  Mood:  Anxious  Affect:  NA  Thought  Process:  Goal Directed  Orientation:  Full (Time, Place, and Person)  Thought Content:  Logical  Suicidal Thoughts:  No  Homicidal Thoughts:  No  Memory:  Immediate;   Good Recent;   Good Remote;   Good  Judgement:  Intact  Insight:  Present  Psychomotor Activity:  NA  Concentration:  Concentration: Good and Attention Span: Good  Recall:  Good  Fund of Knowledge:  Good  Language:  Good  Akathisia:  No  Handed:  Right  AIMS (if indicated):     Assets:  Communication Skills Desire for Improvement Housing Social Support Transportation  ADL's:  Intact  Cognition:  WNL  Sleep:   8 to 9 hours      Assessment and Plan: Major depressive disorder, recurrent.  Anxiety.  Discuss more frequent use of Klonopin.  Discussed benzodiazepine dependency, tolerance and withdrawal.  I encourage walking, going outside and pick up some hobbies to keep herself busy.  She admitted boredom may be the reason that she is feeling anxious.  Patient promised to work on it.  I reviewed blood work results from recent visit to her primary care physician.  Labs are normal.  We will continue Zoloft 150 mg daily and Klonopin 0.5 mg to take as needed for severe anxiety and panic attack.  Recommended to call us back if there is any question or any concern.  Follow-up in 3 months.  Follow Up Instructions:    I discussed the assessment  and treatment plan with the patient. The patient was provided an opportunity to ask questions and all were answered. The patient agreed with the plan and demonstrated an understanding of the instructions.   The patient was advised to call back or seek an in-person evaluation if the symptoms worsen or if the condition fails to improve as anticipated.  Collaboration of Care: Other provider involved in patient's care AEB notes are available in epic to review.  Patient/Guardian was advised Release of Information must be obtained prior to any record release in order to collaborate  their care with an outside provider. Patient/Guardian was advised if they have not already done so to contact the registration department to sign all necessary forms in order for [place] to release information regarding their care.   Consent: Patient/Guardian gives verbal consent for treatment and assignment of benefits for services provided during this visit. Patient/Guardian expressed understanding and agreed to proceed.    I provided 12 minutes of non-face-to-face time during this encounter.   [name], MD

## 2023-01-19 ENCOUNTER — Other Ambulatory Visit: Payer: Self-pay | Admitting: Gastroenterology

## 2023-02-11 ENCOUNTER — Other Ambulatory Visit (HOSPITAL_COMMUNITY): Payer: Self-pay | Admitting: Psychiatry

## 2023-02-11 DIAGNOSIS — F419 Anxiety disorder, unspecified: Secondary | ICD-10-CM

## 2023-02-16 ENCOUNTER — Telehealth: Payer: Self-pay | Admitting: Pulmonary Disease

## 2023-02-16 NOTE — Telephone Encounter (Signed)
Pt wants a letter for jury duty so she wont have to go. Informed pt we haven't seen her since 2023, and she may have to get it from her PCP.

## 2023-02-16 NOTE — Telephone Encounter (Signed)
Spoke with patient she is requesting a letter for [employer] so she wont have to attend. She is worried of being down there all day and potentially needing her nebulizer machine or inhaler since she has COPD. Dr. Tonia Brooms can you please advise if this is okay?

## 2023-02-18 NOTE — Telephone Encounter (Signed)
Have the letter for jury duty ready for pt. Called pt to inform her she can pick up letter any time pt verbalized understanding. Nothing further needed.

## 2023-02-25 ENCOUNTER — Telehealth (HOSPITAL_COMMUNITY): Payer: Self-pay | Admitting: *Deleted

## 2023-02-25 DIAGNOSIS — F419 Anxiety disorder, unspecified: Secondary | ICD-10-CM

## 2023-02-25 MED ORDER — CLONAZEPAM 0.5 MG PO TABS: 0.5000 mg | ORAL_TABLET | Freq: Every day | ORAL | 0 refills | Status: DC | PRN

## 2023-02-25 NOTE — Telephone Encounter (Signed)
Pt called requesting refill of Klonopin 0.5 mg taken ! Tablet QD. Last e-scribed #30 on 12/15/22. Pt has an upcoming appointment scheduled on 03/16/23. Please review and advise.

## 2023-02-25 NOTE — Telephone Encounter (Signed)
Done

## 2023-03-13 ENCOUNTER — Telehealth: Payer: Self-pay | Admitting: *Deleted

## 2023-03-13 NOTE — Telephone Encounter (Signed)
Pt called and left VM regarding scheduling a lung screening CT. Attempted to contact pt but had to leave another message for pt to call back. Record shows that next CT is not due until 07/2023.

## 2023-03-16 ENCOUNTER — Telehealth (HOSPITAL_BASED_OUTPATIENT_CLINIC_OR_DEPARTMENT_OTHER): Payer: Medicare HMO | Admitting: Psychiatry

## 2023-03-16 ENCOUNTER — Encounter (HOSPITAL_COMMUNITY): Payer: Self-pay | Admitting: Psychiatry

## 2023-03-16 VITALS — Wt 130.0 lb

## 2023-03-16 DIAGNOSIS — F331 Major depressive disorder, recurrent, moderate: Secondary | ICD-10-CM

## 2023-03-16 DIAGNOSIS — F419 Anxiety disorder, unspecified: Secondary | ICD-10-CM

## 2023-03-16 MED ORDER — SERTRALINE HCL 100 MG PO TABS: 150.0000 mg | ORAL_TABLET | Freq: Every day | ORAL | 2 refills | Status: DC

## 2023-03-16 NOTE — Progress Notes (Signed)
Health MD Virtual Progress Note   Patient Location: Home Provider Location: Home Health  I connect with patient by telephone and verified that I am speaking with correct person by using two identifiers. I discussed the limitations of evaluation and management by telemedicine and the availability of in person appointments. I also discussed with the patient that there may be a patient responsible charge related to this service. The patient expressed understanding and agreed to proceed.  Vicki Mcmillan [id number] 52 y.o.  03/16/2023 2:26 PM  History of Present Illness:  Patient is evaluated by phone session.  She had not taking the Klonopin as frequently because her anxiety is somewhat better.  She is taking Zoloft 150 mg daily.  She denies any crying spells or any feeling of hopelessness or worthlessness.  Occasionally she cannot sleep but also reported not taking melatonin which she used to take before.  She is pleased that mother is doing well and patient decided to go outside with the dog and trying to spend time with the 87-year-old great grandnephew.  Patient told now they have a trampoline in the backyard and that helps and give a reason to come outside to watch her great grandnephew playing.  She denies any suicidal thoughts or homicidal thoughts.  She denies any paranoia, hallucination or any panic attack.  Her appetite is okay.  She has no tremors or shakes or any EPS.  She does not need the new prescription of Klonopin but promised to give [place] a call back if she needed.  Patient has not seen her PCP in a while and she would like to get an appointment for physical and blood work.  Patient lives with her mother.  Patient is on disability.  Past Psychiatric History: No h/o suicidal attempt or inpatient treatment.  Saw psychiatrist after Chantix caused increased anxiety, hallucination and memory impairment.  Saw Dr. Marnette Burgess at [facility] and given Zoloft Abilify with good response.   PCP prescribed Xanax.      Outpatient Encounter Medications as of 03/16/2023  Medication Sig   albuterol (VENTOLIN HFA) 108 (90 Base) MCG/ACT inhaler INHALE 1 TO 2 PUFFS INTO THE LUNGS EVERY 6 HOURS AS NEEDED FOR WHEEZING OR SHORTNESS OF BREATH   clonazePAM (KLONOPIN) 0.5 MG tablet Take 1 tablet (0.5 mg total) by mouth daily as needed.   cyclobenzaprine (FLEXERIL) 5 MG tablet Take 5 mg by mouth at bedtime.   fluticasone (FLONASE) 50 MCG/ACT nasal spray Place 2 sprays into both nostrils daily. (Patient taking differently: Place 2 sprays into both nostrils daily as needed for allergies.)   Fluticasone-Umeclidin-Vilant (TRELEGY ELLIPTA) 200-62.5-25 MCG/ACT AEPB Inhale 1 puff into the lungs daily.   ipratropium-albuterol (DUONEB) 0.5-2.5 (3) MG/3ML SOLN Take 3 mLs by nebulization 2 (two) times daily. (Patient taking differently: Take 3 mLs by nebulization 2 (two) times daily as needed (asthma).)   loratadine (CLARITIN) 10 MG tablet Take 10 mg by mouth daily.   melatonin 5 MG TABS Take 5 mg by mouth at bedtime.   pantoprazole (PROTONIX) 40 MG tablet TAKE 1 TABLET BY MOUTH TWICE DAILY FOR 4 WEEKS, THEN REDUCE TO 1 TABLET DAILY   sertraline (ZOLOFT) 100 MG tablet Take 1.5 tablets (150 mg total) by mouth daily.   No facility-administered encounter medications on file as of 03/16/2023.    No results found for this or any previous visit (from the past 2160 hour(s)).   Psychiatric Specialty Exam: Physical Exam  Review of Systems  Weight 130 lb (59 kg).There  is no height or weight on file to calculate BMI.  General Appearance: NA  Eye Contact:  NA  Speech:  Clear and Coherent and Normal Rate  Volume:  Normal  Mood:  Euthymic  Affect:  NA  Thought Process:  Goal Directed  Orientation:  Full (Time, Place, and Person)  Thought Content:  WDL and Logical  Suicidal Thoughts:  No  Homicidal Thoughts:  No  Memory:  Immediate;   Good Recent;   Good Remote;   Good  Judgement:  Good  Insight:  Good   Psychomotor Activity:  Normal  Concentration:  Concentration: Good and Attention Span: Good  Recall:  Good  Fund of Knowledge:  Good  Language:  Good  Akathisia:  No  Handed:  Right  AIMS (if indicated):     Assets:  Communication Skills Desire for Improvement Housing Resilience Social Support Transportation  ADL's:  Intact  Cognition:  WNL  Sleep:  ok     Assessment/Plan: MDD (major depressive disorder), recurrent episode, moderate (HCC) - Plan: sertraline (ZOLOFT) 100 MG tablet  Anxiety - Plan: sertraline (ZOLOFT) 100 MG tablet  Patient doing well on current medication.  She had cut down her Klonopin and only take as needed and does not need a new refill at this time.  She also endorsed that she will take the melatonin first if she cannot sleep but lately his sleep has been better than before.  Since starting going outside every day she does not feel as boredom.  She lives with her mother.  Continue Zoloft 150 mg daily and Klonopin 0.5 mg to take as needed for severe anxiety panic attack.  Recommend to call us back if she has any question or any concern.  Follow-up in 3 months.   Follow Up Instructions:     I discussed the assessment and treatment plan with the patient. The patient was provided an opportunity to ask questions and all were answered. The patient agreed with the plan and demonstrated an understanding of the instructions.   The patient was advised to call back or seek an in-person evaluation if the symptoms worsen or if the condition fails to improve as anticipated.    Collaboration of Care: Other provider involved in patient's care AEB notes are available in epic to review  Patient/Guardian was advised Release of Information must be obtained prior to any record release in order to collaborate their care with an outside provider. Patient/Guardian was advised if they have not already done so to contact the registration department to sign all necessary forms in  order for [place] to release information regarding their care.   Consent: Patient/Guardian gives verbal consent for treatment and assignment of benefits for services provided during this visit. Patient/Guardian expressed understanding and agreed to proceed.     I provided 11 minutes of non face to face time during this encounter.  Note: This document was prepared by [employer] voice dictation technology and any errors that results from this process are unintentional.    Cleotis Nipper, MD 03/16/2023

## 2023-03-19 ENCOUNTER — Telehealth (HOSPITAL_COMMUNITY): Payer: Self-pay | Admitting: *Deleted

## 2023-03-19 NOTE — Telephone Encounter (Signed)
Rx REFILL REQUEST :: sertraline (ZOLOFT) 100 MG tablet [[id number]] **Last Fill date:: 02/17/2023   Order Details Dose: 150 mg Route: Oral Frequency: Daily  Dispense Quantity: 45 tablet Refills: 2   Note to Pharmacy: No auto refill  Indications of Use: Major Depressive Disorder       Sig: Take 1.5 tablets (150 mg total) by mouth daily.       Start Date: 03/16/23 End Date: --  Written Date: 03/16/23 Expiration Date: 03/15/24     Associated Diagnoses: Anxiety [F41.9]; MDD (major depressive disorder), recurrent episode, moderate (HCC) [F33.1]  Original Order: sertraline (ZOLOFT) 100 MG tablet [[id number]]   [id number] - [place], [place] - [address] [place] [address] AT [facility] [address]  [address], [place] [address]

## 2023-03-19 NOTE — Telephone Encounter (Signed)
Please check the chart.  Prescription was given two days ago with two additional refills.

## 2023-04-02 ENCOUNTER — Other Ambulatory Visit: Payer: Self-pay | Admitting: Pulmonary Disease

## 2023-04-02 ENCOUNTER — Telehealth: Payer: Self-pay | Admitting: Pulmonary Disease

## 2023-04-02 NOTE — Telephone Encounter (Signed)
albuterol (VENTOLIN HFA) 108 (90 Base) MCG/ACT inhaler   [facility] in [place] on [address]

## 2023-04-02 NOTE — Telephone Encounter (Signed)
Spoke with patient. Advised albuterol inhaler has been sent to pharmacy. NFN 

## 2023-04-15 ENCOUNTER — Other Ambulatory Visit (HOSPITAL_COMMUNITY): Payer: Self-pay | Admitting: Psychiatry

## 2023-04-15 DIAGNOSIS — F419 Anxiety disorder, unspecified: Secondary | ICD-10-CM

## 2023-04-18 ENCOUNTER — Other Ambulatory Visit: Payer: Self-pay | Admitting: Pulmonary Disease

## 2023-04-30 ENCOUNTER — Other Ambulatory Visit (HOSPITAL_COMMUNITY): Payer: Self-pay

## 2023-04-30 DIAGNOSIS — F419 Anxiety disorder, unspecified: Secondary | ICD-10-CM

## 2023-04-30 MED ORDER — CLONAZEPAM 0.5 MG PO TABS: 0.5000 mg | ORAL_TABLET | Freq: Every day | ORAL | 0 refills | Status: DC | PRN

## 2023-06-11 ENCOUNTER — Telehealth (HOSPITAL_BASED_OUTPATIENT_CLINIC_OR_DEPARTMENT_OTHER): Payer: Medicare HMO | Admitting: Psychiatry

## 2023-06-11 ENCOUNTER — Encounter (HOSPITAL_COMMUNITY): Payer: Self-pay | Admitting: Psychiatry

## 2023-06-11 VITALS — Wt 130.0 lb

## 2023-06-11 DIAGNOSIS — F419 Anxiety disorder, unspecified: Secondary | ICD-10-CM | POA: Diagnosis not present

## 2023-06-11 DIAGNOSIS — F331 Major depressive disorder, recurrent, moderate: Secondary | ICD-10-CM

## 2023-06-11 MED ORDER — SERTRALINE HCL 100 MG PO TABS
150.0000 mg | ORAL_TABLET | Freq: Every day | ORAL | 2 refills | Status: DC
Start: 2023-06-11 — End: 2023-09-10

## 2023-06-11 NOTE — Progress Notes (Signed)
[facility] MD Virtual Progress Note   Patient Location: Home Provider Location: Home Office  I connect with patient by telephone and verified that I am speaking with correct person by using two identifiers. I discussed the limitations of evaluation and management by telemedicine and the availability of in person appointments. I also discussed with the patient that there may be a patient responsible charge related to this service. The patient expressed understanding and agreed to proceed.  Vicki Mcmillan [id number] 52 y.o.  06/11/2023 2:35 PM  History of Present Illness:  Patient is evaluated by phone session.  She has been doing well on her current medication.  She had cut down her Klonopin.  Recently she has a visit with the neurology for migraine headaches.  She was prescribed low-dose nortriptyline and that has been very helpful for her headaches.  She also taking Zoloft 150.  She denies any crying spells or any feeling of hopelessness or worthlessness.  She enjoys the company of 63-year-old grandnephew.  Patient lives with her mother who works as a Facilities manager.  Patient feels the current medicine is working and she denied any suicidal thoughts.  Energy level is good.  Patient is on disability.  She has no tremor or shakes or any EPS.  She like to continue current dose of Zoloft.  She still has Klonopin left but her plan is not to continue in the future.  Past Psychiatric History: No h/o suicidal attempt or inpatient treatment.  Saw psychiatrist after Chantix caused increased anxiety, hallucination and memory impairment.  Saw Dr. Marnette Burgess at [facility] and given Zoloft Abilify with good response.  PCP prescribed Xanax.      Outpatient Encounter Medications as of 06/11/2023  Medication Sig   albuterol (VENTOLIN HFA) 108 (90 Base) MCG/ACT inhaler INHALE 1 TO 2 PUFFS INTO THE LUNGS EVERY 6 HOURS AS NEEDED FOR WHEEZING OR SHORTNESS OF BREATH   clonazePAM (KLONOPIN) 0.5 MG tablet Take 1  tablet (0.5 mg total) by mouth daily as needed.   cyclobenzaprine (FLEXERIL) 5 MG tablet Take 5 mg by mouth at bedtime.   fluticasone (FLONASE) 50 MCG/ACT nasal spray Place 2 sprays into both nostrils daily. (Patient taking differently: Place 2 sprays into both nostrils daily as needed for allergies.)   ipratropium-albuterol (DUONEB) 0.5-2.5 (3) MG/3ML SOLN Take 3 mLs by nebulization 2 (two) times daily. (Patient taking differently: Take 3 mLs by nebulization 2 (two) times daily as needed (asthma).)   loratadine (CLARITIN) 10 MG tablet Take 10 mg by mouth daily.   melatonin 5 MG TABS Take 5 mg by mouth at bedtime.   pantoprazole (PROTONIX) 40 MG tablet TAKE 1 TABLET BY MOUTH TWICE DAILY FOR 4 WEEKS, THEN REDUCE TO 1 TABLET DAILY   sertraline (ZOLOFT) 100 MG tablet Take 1.5 tablets (150 mg total) by mouth daily.   TRELEGY ELLIPTA 200-62.5-25 MCG/ACT AEPB INHALE 1 PUFF EVERY DAY   No facility-administered encounter medications on file as of 06/11/2023.    No results found for this or any previous visit (from the past 2160 hour(s)).   Psychiatric Specialty Exam: Physical Exam  Review of Systems  Weight 130 lb (59 kg).There is no height or weight on file to calculate BMI.  General Appearance: NA  Eye Contact:  NA  Speech:  Normal Rate  Volume:  Normal  Mood:  Euthymic  Affect:  NA  Thought Process:  Goal Directed  Orientation:  Full (Time, Place, and Person)  Thought Content:  Logical  Suicidal Thoughts:  No  Homicidal Thoughts:  No  Memory:  Immediate;   Good Recent;   Good Remote;   Good  Judgement:  Good  Insight:  Present  Psychomotor Activity:  Normal  Concentration:  Concentration: Good and Attention Span: Good  Recall:  Good  Fund of Knowledge:  Good  Language:  Good  Akathisia:  No  Handed:  Right  AIMS (if indicated):     Assets:  Communication Skills Desire for Improvement Housing Social Support Transportation  ADL's:  Intact  Cognition:  WNL  Sleep:  ok      Assessment/Plan: MDD (major depressive disorder), recurrent episode, moderate (HCC) - Plan: sertraline (ZOLOFT) 100 MG tablet  Anxiety - Plan: sertraline (ZOLOFT) 100 MG tablet  I reviewed the chart and Care Everywhere.  She is on Maxalt and nortriptyline for headaches.  She is doing better.  I discussed that nortriptyline is also an antidepressant and she need to watch carefully the tremors and shakes.  So far patient has no issue.  Continue Zoloft and 50 mg daily.  She does not need a new prescription of Klonopin as a panic attacks or less frequent and less intense.  Recommend to call us back if she has any question or any concern.  Continue Zoloft 150 mg daily.  Follow-up in 3 months   Follow Up Instructions:     I discussed the assessment and treatment plan with the patient. The patient was provided an opportunity to ask questions and all were answered. The patient agreed with the plan and demonstrated an understanding of the instructions.   The patient was advised to call back or seek an in-person evaluation if the symptoms worsen or if the condition fails to improve as anticipated.    Collaboration of Care: Other provider involved in patient's care AEB notes are available in epic to review  Patient/Guardian was advised Release of Information must be obtained prior to any record release in order to collaborate their care with an outside provider. Patient/Guardian was advised if they have not already done so to contact the registration department to sign all necessary forms in order for [place] to release information regarding their care.   Consent: Patient/Guardian gives verbal consent for treatment and assignment of benefits for services provided during this visit. Patient/Guardian expressed understanding and agreed to proceed.     I provided 29 minutes of non face to face time during this encounter.  Note: This document was prepared by [employer] voice dictation technology and any errors  that results from this process are unintentional.    Cleotis Nipper, MD 06/11/2023

## 2023-07-07 ENCOUNTER — Ambulatory Visit (HOSPITAL_BASED_OUTPATIENT_CLINIC_OR_DEPARTMENT_OTHER)
Admission: RE | Admit: 2023-07-07 | Discharge: 2023-07-07 | Disposition: A | Discharge status: Home / Self Care | Payer: Medicare HMO | Source: Ambulatory Visit | Attending: Acute Care | Admitting: Acute Care

## 2023-07-07 DIAGNOSIS — Z87891 Personal history of nicotine dependence: Secondary | ICD-10-CM | POA: Insufficient documentation

## 2023-07-07 DIAGNOSIS — Z122 Encounter for screening for malignant neoplasm of respiratory organs: Secondary | ICD-10-CM | POA: Diagnosis present

## 2023-07-07 DIAGNOSIS — J439 Emphysema, unspecified: Secondary | ICD-10-CM | POA: Insufficient documentation

## 2023-07-16 ENCOUNTER — Telehealth: Payer: Self-pay | Admitting: Acute Care

## 2023-07-16 ENCOUNTER — Other Ambulatory Visit: Payer: Self-pay

## 2023-07-16 DIAGNOSIS — Z87891 Personal history of nicotine dependence: Secondary | ICD-10-CM

## 2023-07-16 DIAGNOSIS — R911 Solitary pulmonary nodule: Secondary | ICD-10-CM

## 2023-07-16 NOTE — Telephone Encounter (Signed)
Spoke with patient by phone, using two patient identifiers, to review results of LDCT.  New lung nodule with recommendation for 6 months follow up LDCT.  Likely benign but would recommend reviewing nodule again sooner than one year, as precaution. Patient has had repeat LDCT previously for nodules.  She is in agreement with plan.  Emphysema noted, as previously seen.  Results/plan routed to PCP and order placed for repeat scan.  Will contact patient closer to date for scheduling .  Patient verbalized understanding.

## 2023-08-19 IMAGING — CT CT CHEST LUNG CANCER SCREENING LOW DOSE W/O CM
2 of 4 series · 15 of 36 positions shown, 18 images · non-contrast
Comparison: 02/11/2021

CLINICAL DATA: 50-year-old female with 20 pack-year history of
smoking. Lung cancer screening.



[Series 3: lung thins 1.0 · axial · 0.66mm/px · z∈[-335,-31]mm · 12 of 336 slices shown, 15 images]
[im 16/336  mediastinal]
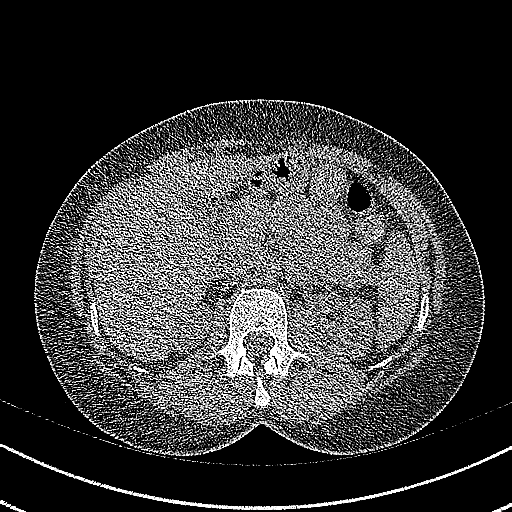
[im 16/336  lung]
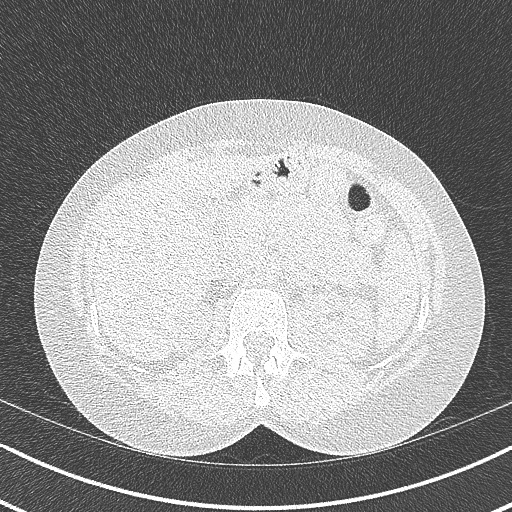
[im 46/336  lung]
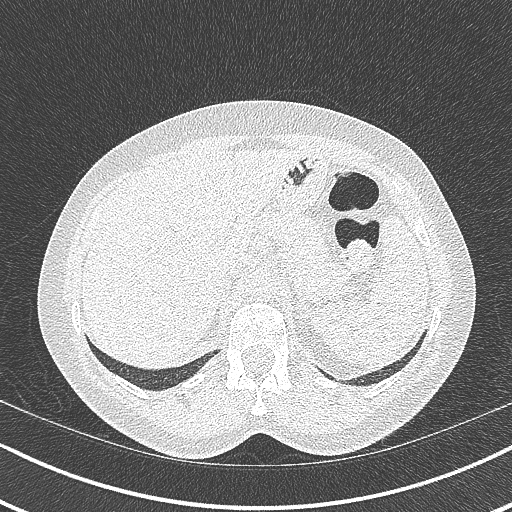
[im 77/336  lung]
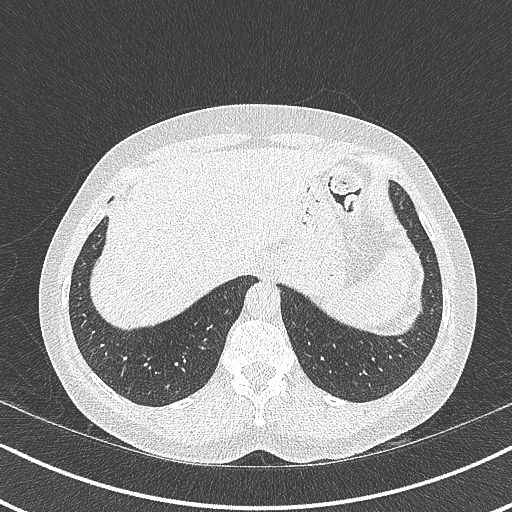
[im 107/336  lung]
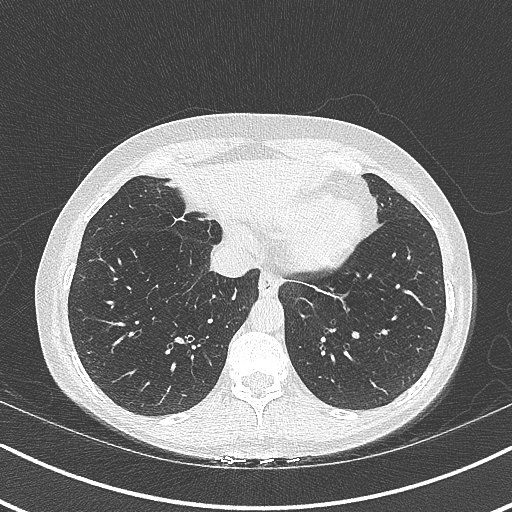
[im 122/336  mediastinal]
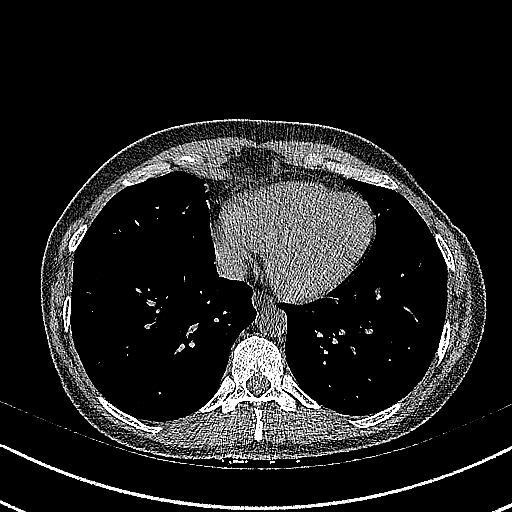
[im 122/336  lung]
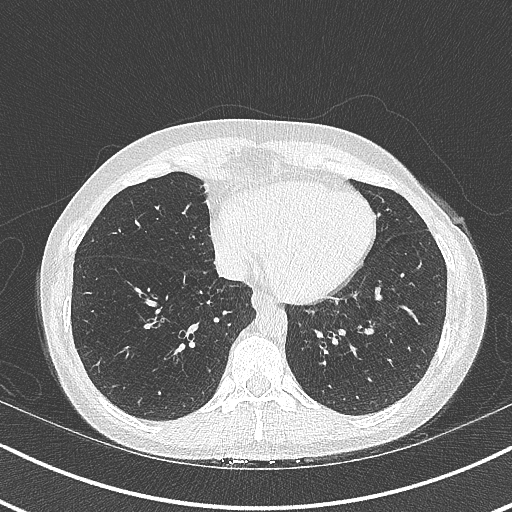
[im 153/336  lung]
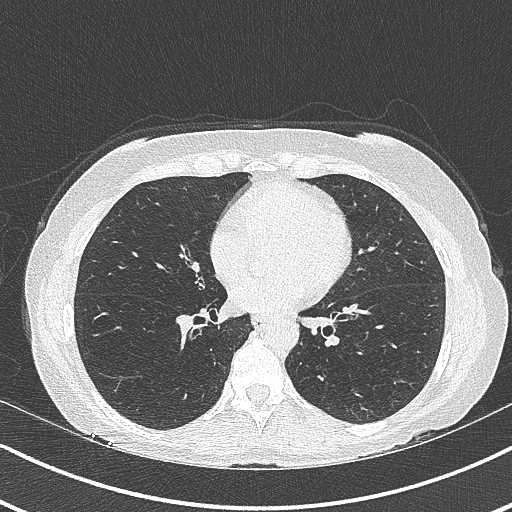
[im 183/336  lung]
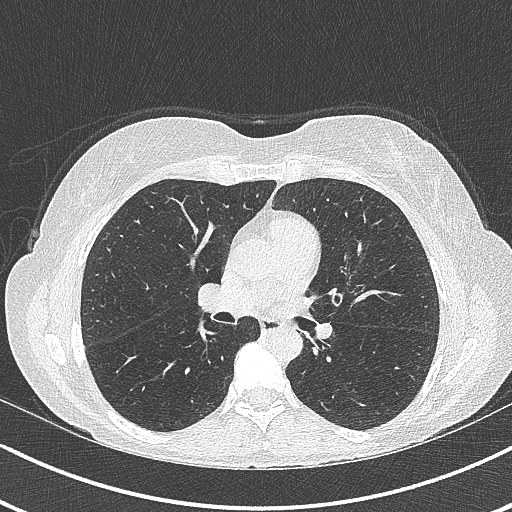
[im 214/336  lung]
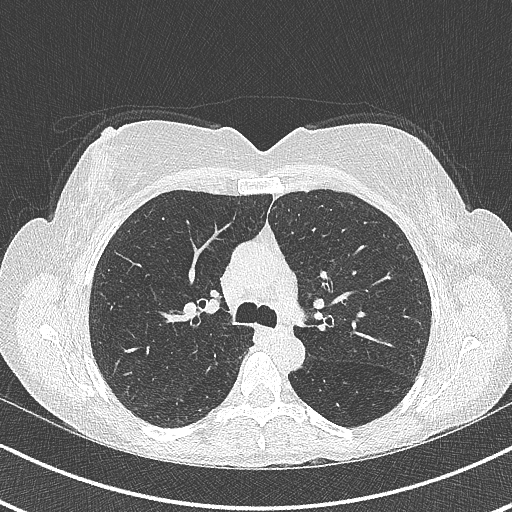
[im 229/336  mediastinal]
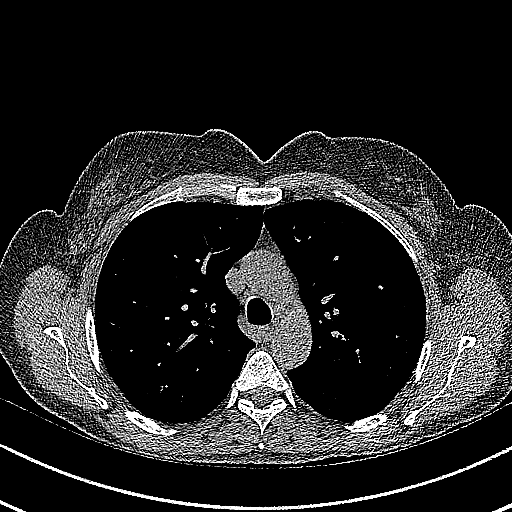
[im 229/336  lung]
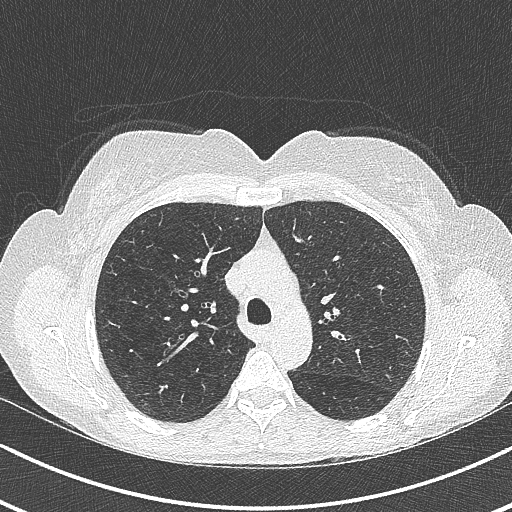
[im 259/336  lung]
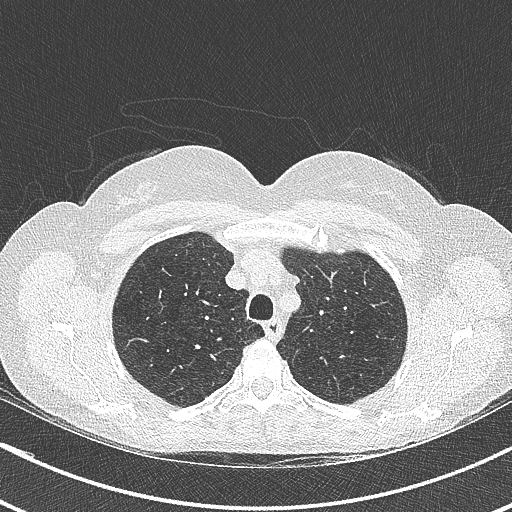
[im 290/336  lung]
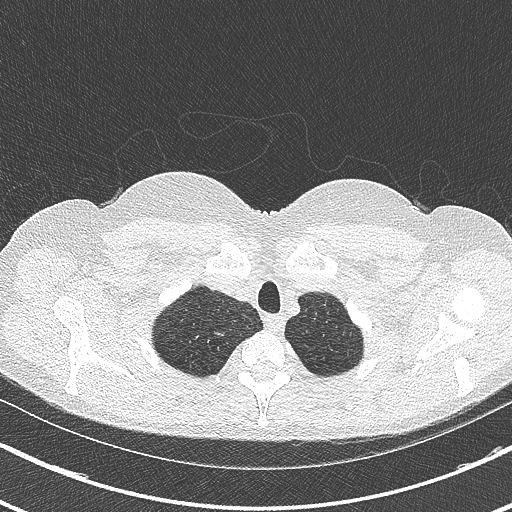
[im 320/336  lung]
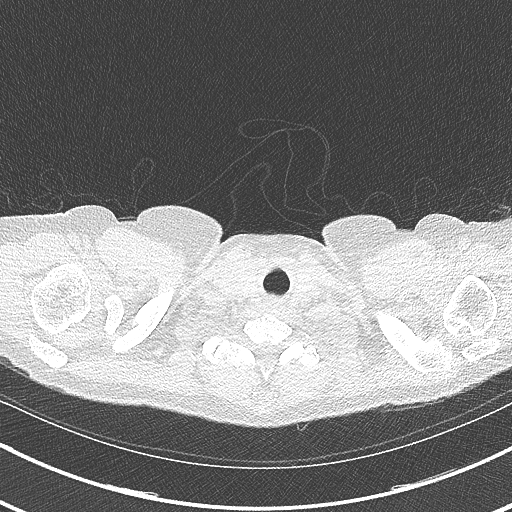

[Series 5: coronal · coronal · 0.59mm/px · 3 of 108 slices shown]
[im 22/108  lung]
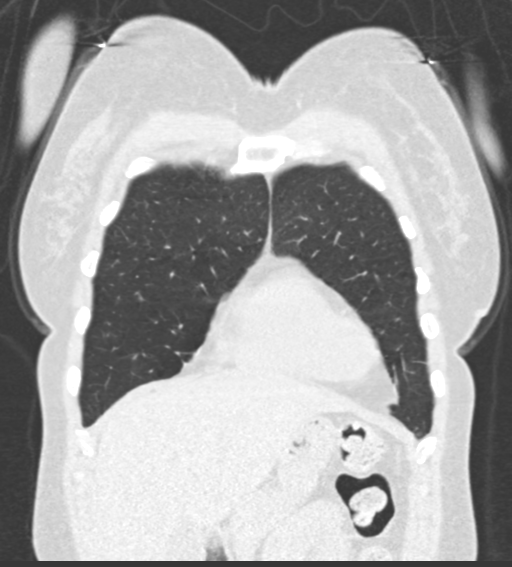
[im 43/108  lung]
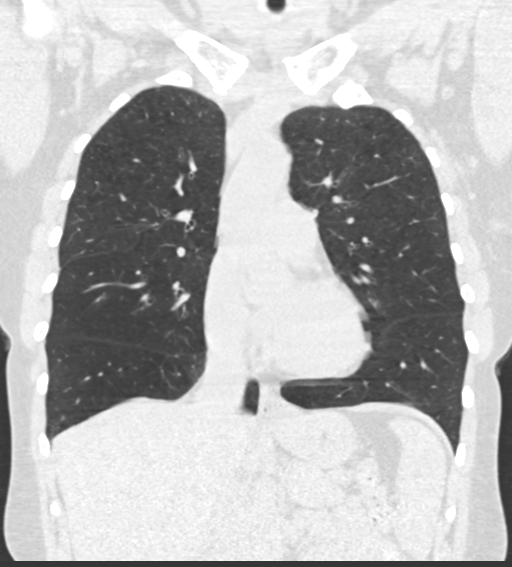
[im 65/108  lung]
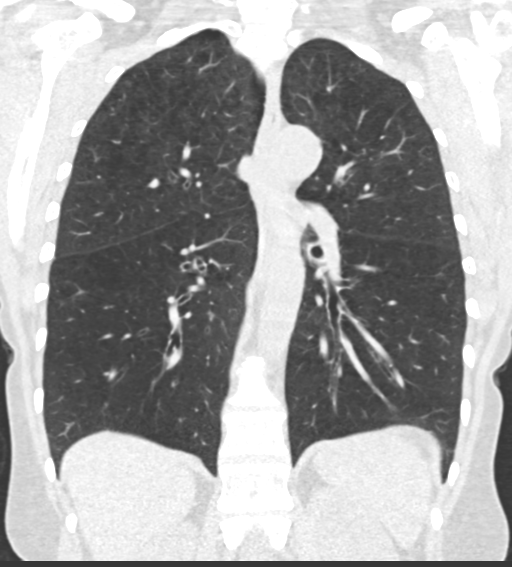

[15 of 36 positions shown; findings below may reference images not displayed]

FINDINGS: Cardiovascular: The heart size is normal. No substantial pericardial
effusion. Insert calcium thoracic normal

Mediastinum/Nodes: No mediastinal lymphadenopathy. No evidence for
gross hilar lymphadenopathy although assessment is limited by the
lack of intravenous contrast on the current study. The esophagus has
normal imaging features. There is no axillary lymphadenopathy.

Lungs/Pleura: Centrilobular and paraseptal emphysema evident.
Interval development of a new 15.7 mm nodule in the left upper lobe
(image 96). No additional suspicious pulmonary nodule or mass. No
focal airspace consolidation. No pleural effusion.

Upper Abdomen: Unremarkable.

Musculoskeletal: No worrisome lytic or sclerotic osseous
abnormality.
IMPRESSION: 1. Lung-RADS 4B, suspicious. Interval development of a new 15.7 mm
left upper lobe pulmonary nodule.
2. Coronary artery atherosclerosis. Additional imaging evaluation or
consultation with Pulmonology or Thoracic Surgery recommended.
3. Aortic Atherosclerosis ([id number].X) and Emphysema ([id number].5).

These results will be called to the ordering clinician or
representative by the Radiologist Assistant, and communication
documented in the PACS or [REDACTED].

## 2023-09-01 ENCOUNTER — Other Ambulatory Visit: Payer: Self-pay | Admitting: Gastroenterology

## 2023-09-05 ENCOUNTER — Emergency Department (HOSPITAL_COMMUNITY): Payer: Medicare HMO

## 2023-09-05 ENCOUNTER — Encounter (HOSPITAL_COMMUNITY): Payer: Self-pay

## 2023-09-05 ENCOUNTER — Inpatient Hospital Stay (HOSPITAL_COMMUNITY)
Admission: EM | Admit: 2023-09-05 | Discharge: 2023-09-08 | DRG: 189 | Disposition: A | Discharge status: Home / Self Care | Payer: Medicare HMO | Attending: Internal Medicine | Admitting: Internal Medicine

## 2023-09-05 ENCOUNTER — Other Ambulatory Visit: Payer: Self-pay

## 2023-09-05 DIAGNOSIS — I252 Old myocardial infarction: Secondary | ICD-10-CM | POA: Diagnosis not present

## 2023-09-05 DIAGNOSIS — E876 Hypokalemia: Secondary | ICD-10-CM | POA: Diagnosis present

## 2023-09-05 DIAGNOSIS — K219 Gastro-esophageal reflux disease without esophagitis: Secondary | ICD-10-CM | POA: Diagnosis present

## 2023-09-05 DIAGNOSIS — Z885 Allergy status to narcotic agent status: Secondary | ICD-10-CM | POA: Diagnosis not present

## 2023-09-05 DIAGNOSIS — J439 Emphysema, unspecified: Secondary | ICD-10-CM | POA: Diagnosis present

## 2023-09-05 DIAGNOSIS — F419 Anxiety disorder, unspecified: Secondary | ICD-10-CM | POA: Diagnosis present

## 2023-09-05 DIAGNOSIS — Z87891 Personal history of nicotine dependence: Secondary | ICD-10-CM

## 2023-09-05 DIAGNOSIS — Z1152 Encounter for screening for COVID-19: Secondary | ICD-10-CM

## 2023-09-05 DIAGNOSIS — J9601 Acute respiratory failure with hypoxia: Principal | ICD-10-CM | POA: Diagnosis present

## 2023-09-05 DIAGNOSIS — Z8541 Personal history of malignant neoplasm of cervix uteri: Secondary | ICD-10-CM

## 2023-09-05 DIAGNOSIS — Z91018 Allergy to other foods: Secondary | ICD-10-CM

## 2023-09-05 DIAGNOSIS — Z825 Family history of asthma and other chronic lower respiratory diseases: Secondary | ICD-10-CM | POA: Diagnosis not present

## 2023-09-05 DIAGNOSIS — F32A Depression, unspecified: Secondary | ICD-10-CM | POA: Diagnosis present

## 2023-09-05 DIAGNOSIS — Z888 Allergy status to other drugs, medicaments and biological substances status: Secondary | ICD-10-CM | POA: Diagnosis not present

## 2023-09-05 DIAGNOSIS — R000 Tachycardia, unspecified: Secondary | ICD-10-CM | POA: Diagnosis present

## 2023-09-05 DIAGNOSIS — J441 Chronic obstructive pulmonary disease with (acute) exacerbation: Principal | ICD-10-CM | POA: Insufficient documentation

## 2023-09-05 DIAGNOSIS — R0902 Hypoxemia: Secondary | ICD-10-CM | POA: Diagnosis present

## 2023-09-05 DIAGNOSIS — Z79899 Other long term (current) drug therapy: Secondary | ICD-10-CM

## 2023-09-05 DIAGNOSIS — Z85828 Personal history of other malignant neoplasm of skin: Secondary | ICD-10-CM

## 2023-09-05 LAB — CBC WITH DIFFERENTIAL/PLATELET
Abs Immature Granulocytes: 0.02 10*3/uL (ref 0.00–0.07)
Basophils Absolute: 0 10*3/uL (ref 0.0–0.1)
Basophils Relative: 1 %
Eosinophils Absolute: 0.1 10*3/uL (ref 0.0–0.5)
Eosinophils Relative: 1 %
HCT: 40.1 % (ref 36.0–46.0)
Hemoglobin: 13.5 g/dL (ref 12.0–15.0)
Immature Granulocytes: 1 %
Lymphocytes Relative: 28 %
Lymphs Abs: 1.2 10*3/uL (ref 0.7–4.0)
MCH: 30 pg (ref 26.0–34.0)
MCHC: 33.7 g/dL (ref 30.0–36.0)
MCV: 89.1 fL (ref 80.0–100.0)
Monocytes Absolute: 0.1 10*3/uL (ref 0.1–1.0)
Monocytes Relative: 3 %
Neutro Abs: 2.9 10*3/uL (ref 1.7–7.7)
Neutrophils Relative %: 66 %
Platelets: 146 10*3/uL — ABNORMAL LOW (ref 150–400)
RBC: 4.5 MIL/uL (ref 3.87–5.11)
RDW: 13.2 % (ref 11.5–15.5)
WBC: 4.2 10*3/uL (ref 4.0–10.5)
nRBC: 0 % (ref 0.0–0.2)

## 2023-09-05 LAB — BASIC METABOLIC PANEL
Anion gap: 10 (ref 5–15)
BUN: 22 mg/dL — ABNORMAL HIGH (ref 6–20)
CO2: 18 mmol/L — ABNORMAL LOW (ref 22–32)
Calcium: 8.3 mg/dL — ABNORMAL LOW (ref 8.9–10.3)
Chloride: 109 mmol/L (ref 98–111)
Creatinine, Ser: 0.85 mg/dL (ref 0.44–1.00)
GFR, Estimated: >60 mL/min (ref >60)
Glucose, Bld: 94 mg/dL (ref 70–99)
Potassium: 3.3 mmol/L — ABNORMAL LOW (ref 3.5–5.1)
Sodium: 137 mmol/L (ref 135–145)

## 2023-09-05 LAB — D-DIMER, QUANTITATIVE: D-Dimer, Quant: 0.34 ug{FEU}/mL (ref 0.00–0.50)

## 2023-09-05 LAB — RESP PANEL BY RT-PCR (RSV, FLU A&B, COVID)  RVPGX2
Influenza A by PCR: NEGATIVE
Influenza B by PCR: NEGATIVE
Resp Syncytial Virus by PCR: NEGATIVE
SARS Coronavirus 2 by RT PCR: NEGATIVE

## 2023-09-05 LAB — PROCALCITONIN: Procalcitonin: <0.1 ng/mL

## 2023-09-05 LAB — TROPONIN I (HIGH SENSITIVITY)
Troponin I (High Sensitivity): 2 ng/L (ref <18)
Troponin I (High Sensitivity): 2 ng/L (ref <18)

## 2023-09-05 MED ORDER — SODIUM CHLORIDE 0.9 % IV SOLN
1.0000 g | Freq: Once | INTRAVENOUS | Status: AC
  Administered 2023-09-05: 1 g via INTRAVENOUS
  Filled 2023-09-05: qty 10

## 2023-09-05 MED ORDER — IPRATROPIUM-ALBUTEROL 0.5-2.5 (3) MG/3ML IN SOLN
3.0000 mL | Freq: Four times a day (QID) | RESPIRATORY_TRACT | Status: DC
  Administered 2023-09-05 – 2023-09-08 (×11): 3 mL via RESPIRATORY_TRACT
  Filled 2023-09-05 (×11): qty 3

## 2023-09-05 MED ORDER — METHYLPREDNISOLONE SODIUM SUCC 125 MG IJ SOLR
60.0000 mg | Freq: Two times a day (BID) | INTRAMUSCULAR | Status: AC
  Administered 2023-09-05 – 2023-09-06 (×2): 60 mg via INTRAVENOUS
  Filled 2023-09-05 (×2): qty 2

## 2023-09-05 MED ORDER — ACETAMINOPHEN 325 MG PO TABS
650.0000 mg | ORAL_TABLET | Freq: Once | ORAL | Status: AC
  Administered 2023-09-05: 650 mg via ORAL
  Filled 2023-09-05: qty 2

## 2023-09-05 MED ORDER — ALBUTEROL SULFATE HFA 108 (90 BASE) MCG/ACT IN AERS: 2.0000 | INHALATION_SPRAY | RESPIRATORY_TRACT | Status: DC | PRN

## 2023-09-05 MED ORDER — ENOXAPARIN SODIUM 40 MG/0.4ML IJ SOSY
40.0000 mg | PREFILLED_SYRINGE | INTRAMUSCULAR | Status: DC
  Administered 2023-09-05 – 2023-09-07 (×3): 40 mg via SUBCUTANEOUS
  Filled 2023-09-05 (×3): qty 0.4

## 2023-09-05 MED ORDER — METHYLPREDNISOLONE SODIUM SUCC 125 MG IJ SOLR
125.0000 mg | Freq: Once | INTRAMUSCULAR | Status: AC
  Administered 2023-09-05: 125 mg via INTRAVENOUS
  Filled 2023-09-05: qty 2

## 2023-09-05 MED ORDER — IBUPROFEN 400 MG PO TABS
400.0000 mg | ORAL_TABLET | Freq: Once | ORAL | Status: AC
  Administered 2023-09-05: 400 mg via ORAL
  Filled 2023-09-05: qty 1

## 2023-09-05 MED ORDER — ALBUTEROL SULFATE (2.5 MG/3ML) 0.083% IN NEBU
2.5000 mg | INHALATION_SOLUTION | RESPIRATORY_TRACT | Status: DC | PRN
  Administered 2023-09-06: 2.5 mg via RESPIRATORY_TRACT
  Filled 2023-09-05: qty 3

## 2023-09-05 MED ORDER — ONDANSETRON HCL 4 MG/2ML IJ SOLN
4.0000 mg | Freq: Four times a day (QID) | INTRAMUSCULAR | Status: DC | PRN
  Administered 2023-09-07: 4 mg via INTRAVENOUS
  Filled 2023-09-05: qty 2

## 2023-09-05 MED ORDER — ALBUTEROL (5 MG/ML) CONTINUOUS INHALATION SOLN
10.0000 mg/h | INHALATION_SOLUTION | Freq: Once | RESPIRATORY_TRACT | Status: DC
  Filled 2023-09-05: qty 20

## 2023-09-05 MED ORDER — ONDANSETRON HCL 4 MG PO TABS: 4.0000 mg | ORAL_TABLET | Freq: Four times a day (QID) | ORAL | Status: DC | PRN

## 2023-09-05 MED ORDER — PANTOPRAZOLE SODIUM 40 MG PO TBEC
80.0000 mg | DELAYED_RELEASE_TABLET | Freq: Every day | ORAL | Status: DC
  Administered 2023-09-05 – 2023-09-07 (×3): 80 mg via ORAL
  Filled 2023-09-05 (×3): qty 2

## 2023-09-05 MED ORDER — SERTRALINE HCL 50 MG PO TABS
150.0000 mg | ORAL_TABLET | Freq: Every day | ORAL | Status: DC
  Administered 2023-09-05 – 2023-09-07 (×3): 150 mg via ORAL
  Filled 2023-09-05 (×3): qty 3

## 2023-09-05 MED ORDER — GUAIFENESIN-DM 100-10 MG/5ML PO SYRP
5.0000 mL | ORAL_SOLUTION | ORAL | Status: DC | PRN
  Administered 2023-09-05 – 2023-09-06 (×2): 5 mL via ORAL
  Filled 2023-09-05 (×2): qty 5

## 2023-09-05 MED ORDER — PREDNISONE 20 MG PO TABS
40.0000 mg | ORAL_TABLET | Freq: Every day | ORAL | Status: DC
  Administered 2023-09-07: 40 mg via ORAL
  Filled 2023-09-05: qty 2

## 2023-09-05 MED ORDER — IPRATROPIUM-ALBUTEROL 0.5-2.5 (3) MG/3ML IN SOLN
3.0000 mL | Freq: Once | RESPIRATORY_TRACT | Status: AC
  Administered 2023-09-05: 3 mL via RESPIRATORY_TRACT
  Filled 2023-09-05: qty 3

## 2023-09-05 MED ORDER — ALBUTEROL SULFATE (2.5 MG/3ML) 0.083% IN NEBU
INHALATION_SOLUTION | RESPIRATORY_TRACT | Status: AC
  Administered 2023-09-05: 10 mg
  Filled 2023-09-05: qty 12

## 2023-09-05 MED ORDER — DOXYCYCLINE HYCLATE 100 MG PO TABS
100.0000 mg | ORAL_TABLET | Freq: Once | ORAL | Status: AC
  Administered 2023-09-05: 100 mg via ORAL
  Filled 2023-09-05: qty 1

## 2023-09-05 MED ORDER — UMECLIDINIUM BROMIDE 62.5 MCG/ACT IN AEPB
1.0000 | INHALATION_SPRAY | Freq: Every day | RESPIRATORY_TRACT | Status: DC
  Administered 2023-09-06 – 2023-09-08 (×3): 1 via RESPIRATORY_TRACT
  Filled 2023-09-05: qty 7

## 2023-09-05 MED ORDER — DOXYCYCLINE HYCLATE 100 MG PO TABS
100.0000 mg | ORAL_TABLET | Freq: Two times a day (BID) | ORAL | Status: DC
  Administered 2023-09-06 – 2023-09-08 (×5): 100 mg via ORAL
  Filled 2023-09-05 (×5): qty 1

## 2023-09-05 MED ORDER — TOPIRAMATE 25 MG PO TABS
50.0000 mg | ORAL_TABLET | Freq: Every day | ORAL | Status: DC
  Administered 2023-09-05 – 2023-09-07 (×3): 50 mg via ORAL
  Filled 2023-09-05 (×3): qty 2

## 2023-09-05 MED ORDER — FLUTICASONE FUROATE-VILANTEROL 200-25 MCG/ACT IN AEPB
1.0000 | INHALATION_SPRAY | Freq: Every day | RESPIRATORY_TRACT | Status: DC
  Administered 2023-09-06 – 2023-09-08 (×3): 1 via RESPIRATORY_TRACT
  Filled 2023-09-05: qty 28

## 2023-09-05 NOTE — ED Notes (Signed)
Pt only able to ambulate 10 feet before becoming very short of breath. At rest, pulse ox on room air 93% before ambulating. After ambulating, pulse ox on room air reading 87% and heart rate 130. Placed pt back on 2l [place] and after resting 5 min, pulse ox improved to 94% and heart rate 103 currently. EDP made aware.

## 2023-09-05 NOTE — Progress Notes (Signed)
Patient arrived to room 335 from ED.  Assessment complete, VS obtained, and Admission database began.

## 2023-09-05 NOTE — H&P (Signed)
History and Physical    Patient: Vicki Mcmillan NWG:[id number] DOB: 02-May-1971 DOA: 09/05/2023 DOS: the patient was seen and examined on 09/05/2023 PCP: Reylynn Manson, NP  Patient coming from: Home  Chief Complaint:  Chief Complaint  Patient presents with   Shortness of Breath   HPI: Vicki Mcmillan is a 52 y.o. female with medical history significant of COPD, anxiety/depression.  Patient seen for shortness of breath.  She was brought in by EMS.  She has been having increased cough with fevers over the last 10 days.  She had a virtual visit on Monday and was given a prescription for antibiotics.  She has been taking doxycycline since then.  On Wednesday, she began to worsen and had increasing shortness of breath with DOE. No further fevers, chills. No chest pain.  Review of Systems: As mentioned in the history of present illness. All other systems reviewed and are negative. 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; cervical cancer   Complication of anesthesia    COPD (chronic obstructive pulmonary disease) (HCC)    Depression    GERD (gastroesophageal reflux disease)    Headache    Myocardial infarction (HCC)    Panic attacks    PONV (postoperative nausea and vomiting)    Past Surgical History:  Procedure Laterality Date   BRONCHIAL BIOPSY  03/25/2022   Procedure: BRONCHIAL BIOPSIES;  Surgeon: Josephine Igo, DO;  Location: [facility];  Service: Pulmonary;;   BRONCHIAL BRUSHINGS  03/25/2022   Procedure: BRONCHIAL BRUSHINGS;  Surgeon: Josephine Igo, DO;  Location: [facility];  Service: Pulmonary;;   BRONCHIAL NEEDLE ASPIRATION BIOPSY  03/25/2022   Procedure: BRONCHIAL NEEDLE ASPIRATION BIOPSIES;  Surgeon: Josephine Igo, DO;  Location: [facility];  Service: Pulmonary;;   COLONOSCOPY WITH ESOPHAGOGASTRODUODENOSCOPY (EGD)  08/11/2022   TUBAL LIGATION     VIDEO BRONCHOSCOPY WITH RADIAL ENDOBRONCHIAL  ULTRASOUND  03/25/2022   Procedure: RADIAL ENDOBRONCHIAL ULTRASOUND;  Surgeon: Josephine Igo, DO;  Location: [facility];  Service: Pulmonary;;   WRIST SURGERY     Social History:  reports that she quit smoking about 14 months ago. Her smoking use included cigarettes. She started smoking about 16 years ago. She has a 3.8 pack-year smoking history. She has never used smokeless tobacco. She reports that she does not drink alcohol and does not use drugs.  Allergies  Allergen Reactions   Hydrocodone-Acetaminophen Nausea And Vomiting   Tomato Rash   Bupropion Other (See Comments)    hallucinations   Oxycodone Nausea And Vomiting and Other (See Comments)    Stomach upset   Varenicline Other (See Comments)    hallucination    Family History  Problem Relation Age of Onset   COPD Father    COPD Sister        Died in her 72s from it.   COPD Maternal Aunt    Colon cancer Neg Hx    Rectal cancer Neg Hx    Stomach cancer Neg Hx    Esophageal cancer Neg Hx     Prior to Admission medications   Medication Sig Start Date End Date Taking? Authorizing Provider  albuterol (VENTOLIN HFA) 108 (90 Base) MCG/ACT inhaler INHALE 1 TO 2 PUFFS INTO THE LUNGS EVERY 6 HOURS AS NEEDED FOR WHEEZING OR SHORTNESS OF BREATH 04/02/23  Yes Icard, Bradley L, DO  cyclobenzaprine (FLEXERIL) 5 MG tablet Take 5 mg  by mouth at bedtime. 12/11/20  Yes [provider]  doxycycline (VIBRA-TABS) 100 MG tablet Take 100 mg by mouth 2 (two) times daily. For 10 days 08/31/23 09/10/23 Yes [provider]  ibuprofen (ADVIL) 200 MG tablet Take 200 mg by mouth every 6 (six) hours as needed for headache.   Yes [provider]  ipratropium-albuterol (DUONEB) 0.5-2.5 (3) MG/3ML SOLN Take 3 mLs by nebulization 2 (two) times daily. Patient taking differently: Take 3 mLs by nebulization 2 (two) times daily as needed (asthma). 01/15/22  Yes Mannam, Praveen, MD  loratadine (CLARITIN) 10 MG tablet Take 10 mg by mouth  daily.   Yes [provider]  pantoprazole (PROTONIX) 40 MG tablet Take 1 tablet (40 mg total) by mouth daily. Patient taking differently: Take 80 mg by mouth at bedtime. 09/02/23  Yes Cirigliano, Vito V, DO  prochlorperazine (COMPAZINE) 5 MG tablet Take 5 mg by mouth every 8 (eight) hours as needed for vomiting or nausea.   Yes [provider]  rizatriptan (MAXALT-MLT) 5 MG disintegrating tablet Take 5 mg by mouth as needed for migraine. 04/17/23  Yes Council Mechanic, NP  sertraline (ZOLOFT) 100 MG tablet Take 1.5 tablets (150 mg total) by mouth daily. Patient taking differently: Take 150 mg by mouth at bedtime. 06/11/23  Yes Arfeen, Phillips Grout, MD  tetrahydrozoline 0.05 % ophthalmic solution Place 2 drops into both eyes daily as needed (irritation).   Yes [provider]  topiramate (TOPAMAX) 50 MG tablet Take 50 mg by mouth at bedtime.   Yes [provider]  Dwyane Luo 200-62.5-25 MCG/ACT AEPB INHALE 1 PUFF EVERY DAY 04/20/23  Yes Josephine Igo, DO    Physical Exam: Vitals:   09/05/23 1730 09/05/23 1734 09/05/23 1800 09/05/23 1830  BP: 131/87  129/81 124/89  Pulse: (!) 101  89 95  Resp: (!) 22  20 18   Temp:      TempSrc:      SpO2: 96% 97% 99% 99%  Weight:      Height:       General: middle age female. Awake and alert and oriented x3. No acute cardiopulmonary distress.  HEENT: Normocephalic atraumatic.  Right and left ears normal in appearance.  Pupils equal, round, reactive to light. Extraocular muscles are intact. Sclerae anicteric and noninjected.  Moist mucosal membranes. No mucosal lesions.  Neck: Neck supple without lymphadenopathy. No carotid bruits. No masses palpated.  Cardiovascular: Regular rate with normal S1-S2 sounds. No murmurs, rubs, gallops auscultated. No JVD.  Respiratory: Diffuse wheezing throughout.  No accessory muscle use. Abdomen: Soft, nontender, nondistended. Active bowel sounds. No masses or hepatosplenomegaly  Skin:  No rashes, lesions, or ulcerations.  Dry, warm to touch. 2+ dorsalis pedis and radial pulses. Musculoskeletal: No calf or leg pain. All major joints not erythematous nontender.  No upper or lower joint deformation.  Good ROM.  No contractures  Psychiatric: Intact judgment and insight. Pleasant and cooperative. Neurologic: No focal neurological deficits. Strength is 5/5 and symmetric in upper and lower extremities.  Cranial nerves II through XII are grossly intact.  Data Reviewed: Results for orders placed or performed during the hospital encounter of 09/05/23 (from the past 24 hour(s))  CBC with Differential     Status: Abnormal   Collection Time: 09/05/23  3:31 PM  Result Value Ref Range   WBC 4.2 4.0 - 10.5 K/uL   RBC 4.50 3.87 - 5.11 MIL/uL   Hemoglobin 13.5 12.0 - 15.0 g/dL   HCT 40.1  36.0 - 46.0 %   MCV 89.1 80.0 - 100.0 fL   MCH 30.0 26.0 - 34.0 pg   MCHC 33.7 30.0 - 36.0 g/dL   RDW 30.8 65.7 - 84.6 %   Platelets 146 (L) 150 - 400 K/uL   nRBC 0.0 0.0 - 0.2 %   Neutrophils Relative % 66 %   Neutro Abs 2.9 1.7 - 7.7 K/uL   Lymphocytes Relative 28 %   Lymphs Abs 1.2 0.7 - 4.0 K/uL   Monocytes Relative 3 %   Monocytes Absolute 0.1 0.1 - 1.0 K/uL   Eosinophils Relative 1 %   Eosinophils Absolute 0.1 0.0 - 0.5 K/uL   Basophils Relative 1 %   Basophils Absolute 0.0 0.0 - 0.1 K/uL   Immature Granulocytes 1 %   Abs Immature Granulocytes 0.02 0.00 - 0.07 K/uL  Basic metabolic panel     Status: Abnormal   Collection Time: 09/05/23  3:31 PM  Result Value Ref Range   Sodium 137 135 - 145 mmol/L   Potassium 3.3 (L) 3.5 - 5.1 mmol/L   Chloride 109 98 - 111 mmol/L   CO2 18 (L) 22 - 32 mmol/L   Glucose, Bld 94 70 - 99 mg/dL   BUN 22 (H) 6 - 20 mg/dL   Creatinine, Ser 9.62 0.44 - 1.00 mg/dL   Calcium 8.3 (L) 8.9 - 10.3 mg/dL   GFR, Estimated >95 >28 mL/min   Anion gap 10 5 - 15  D-dimer, quantitative     Status: None   Collection Time: 09/05/23  3:31 PM  Result Value Ref Range    D-Dimer, Quant 0.34 0.00 - 0.50 ug/mL-FEU  Troponin I (High Sensitivity)     Status: None   Collection Time: 09/05/23  4:05 PM  Result Value Ref Range   Troponin I (High Sensitivity) 2 <18 ng/L  Resp panel by RT-PCR (RSV, Flu A&B, Covid) Anterior Nasal Swab     Status: None   Collection Time: 09/05/23  4:17 PM   Specimen: Anterior Nasal Swab  Result Value Ref Range   SARS Coronavirus 2 by RT PCR NEGATIVE NEGATIVE   Influenza A by PCR NEGATIVE NEGATIVE   Influenza B by PCR NEGATIVE NEGATIVE   Resp Syncytial Virus by PCR NEGATIVE NEGATIVE  Troponin I (High Sensitivity)     Status: None   Collection Time: 09/05/23  6:06 PM  Result Value Ref Range   Troponin I (High Sensitivity) 2 <18 ng/L  Procalcitonin     Status: None   Collection Time: 09/05/23  6:06 PM  Result Value Ref Range   Procalcitonin <0.10 ng/mL    DG Chest 2 View  Result Date: 09/05/2023 CLINICAL DATA:  Shortness of breath. EXAM: CHEST - 2 VIEW COMPARISON:  Chest radiograph dated 03/25/2022. FINDINGS: No focal consolidation, pleural effusion, or pneumothorax. Biopsy clip in the left upper lobe. The cardiac silhouette is within normal limits. No acute osseous pathology. IMPRESSION: No active cardiopulmonary disease. Electronically Signed   By: Elgie Collard M.D.   On: 09/05/2023 16:00     Assessment and Plan: No notes have been filed under this hospital service. Service: Hospitalist  Principal Problem:   Acute respiratory failure with hypoxia (HCC) Active Problems:   GERD (gastroesophageal reflux disease)   COPD with acute exacerbation (HCC)  Acute respiratory failure with hypoxia COPD with acute exacerbation Antibiotics: doxycycline DuoNeb's every 6 scheduled with albuterol every 2 when necessary Continue inhaled steroids and LA bronchodilator Solu-Medrol 60 mg IV every 12  hours Mucinex Will check procalcitonin GERD    Advance Care Planning:   Code Status: Full Code confirmed by patient  Consults:  None  Family Communication: None  Severity of Illness: The appropriate patient status for this patient is INPATIENT. Inpatient status is judged to be reasonable and necessary in order to provide the required intensity of service to ensure the patient's safety. The patient's presenting symptoms, physical exam findings, and initial radiographic and laboratory data in the context of their chronic comorbidities is felt to place them at high risk for further clinical deterioration. Furthermore, it is not anticipated that the patient will be medically stable for discharge from the hospital within 2 midnights of admission.   * I certify that at the point of admission it is my clinical judgment that the patient will require inpatient hospital care spanning beyond 2 midnights from the point of admission due to high intensity of service, high risk for further deterioration and high frequency of surveillance required.*  Author: Levie Heritage, DO 09/05/2023 9:02 PM  For on call review [email or web address].

## 2023-09-05 NOTE — ED Provider Notes (Signed)
[place] EMERGENCY DEPARTMENT AT [facility] Provider Note   CSN: [id number] Arrival date & time: 09/05/23  1458     History  Chief Complaint  Patient presents with   Shortness of Breath    Vicki Mcmillan is a 52 y.o. female with medical history of COPD no longer smoking, GERD, MI, anxiety, asthma.  Patient presents to ED for evaluation of shortness of breath, chest pain.  Patient reports that beginning on 10/23 she developed shortness of breath, sore throat, congestion, productive cough, headache and bodyaches and chills.  The patient was seen on 10/28 on telehealth visit for evaluation.  She was diagnosed with acute bacterial bronchitis, COPD exacerbation and emphysema.  The patient was discharged with doxycycline which she states that she has been taking every day twice a day.  She reports 1 episode of nausea and vomiting 2 days ago but none since then.  Patient reporting chest pain when she is coughing but states there is no chest pain when she is not coughing.  States that she feels lightheaded and dizzy with ambulation as well as weak.  Has not been able to consume much food secondary to symptoms.  Per EMS, the patient was hypoxic on their arrival with oxygen saturation 88%, she was placed on 2 L of oxygen via nasal cannula.  She denies leg swelling.   Shortness of Breath Associated symptoms: chest pain and fever   Associated symptoms: no abdominal pain and no vomiting        Home Medications Prior to Admission medications   Medication Sig Start Date End Date Taking? Authorizing Provider  albuterol (VENTOLIN HFA) 108 (90 Base) MCG/ACT inhaler INHALE 1 TO 2 PUFFS INTO THE LUNGS EVERY 6 HOURS AS NEEDED FOR WHEEZING OR SHORTNESS OF BREATH 04/02/23  Yes Icard, Bradley L, DO  cyclobenzaprine (FLEXERIL) 5 MG tablet Take 5 mg by mouth at bedtime. 12/11/20  Yes [provider]  doxycycline (VIBRA-TABS) 100 MG tablet Take 100 mg by mouth 2 (two) times daily. For 10 days  08/31/23 09/10/23 Yes [provider]  ibuprofen (ADVIL) 200 MG tablet Take 200 mg by mouth every 6 (six) hours as needed for headache.   Yes [provider]  ipratropium-albuterol (DUONEB) 0.5-2.5 (3) MG/3ML SOLN Take 3 mLs by nebulization 2 (two) times daily. Patient taking differently: Take 3 mLs by nebulization 2 (two) times daily as needed (asthma). 01/15/22  Yes Mannam, Praveen, MD  loratadine (CLARITIN) 10 MG tablet Take 10 mg by mouth daily.   Yes [provider]  pantoprazole (PROTONIX) 40 MG tablet Take 1 tablet (40 mg total) by mouth daily. Patient taking differently: Take 80 mg by mouth at bedtime. 09/02/23  Yes Cirigliano, Vito V, DO  prochlorperazine (COMPAZINE) 5 MG tablet Take 5 mg by mouth every 8 (eight) hours as needed for vomiting or nausea.   Yes [provider]  rizatriptan (MAXALT-MLT) 5 MG disintegrating tablet Take 5 mg by mouth as needed for migraine. 04/17/23  Yes Council Mechanic, NP  sertraline (ZOLOFT) 100 MG tablet Take 1.5 tablets (150 mg total) by mouth daily. Patient taking differently: Take 150 mg by mouth at bedtime. 06/11/23  Yes Arfeen, Phillips Grout, MD  tetrahydrozoline 0.05 % ophthalmic solution Place 2 drops into both eyes daily as needed (irritation).   Yes [provider]  topiramate (TOPAMAX) 50 MG tablet Take 50 mg by mouth at bedtime.   Yes [provider]  Dwyane Luo 200-62.5-25 MCG/ACT AEPB INHALE 1 PUFF  EVERY DAY 04/20/23  Yes Icard, Bradley L, DO      Allergies    Hydrocodone-acetaminophen, Tomato, Bupropion, Oxycodone, and Varenicline    Review of Systems   Review of Systems  Constitutional:  Positive for fever.  Respiratory:  Positive for shortness of breath.   Cardiovascular:  Positive for chest pain. Negative for leg swelling.  Gastrointestinal:  Negative for abdominal pain, nausea and vomiting.  Neurological:  Positive for dizziness, weakness and light-headedness.  All other systems  reviewed and are negative.   Physical Exam Updated Vital Signs BP 129/81   Pulse 93   Temp 98.9 F (37.2 C)   Resp 19   Ht 5\' 1"  (1.549 m)   Wt 59 kg   SpO2 97%   BMI 24.56 kg/m  Physical Exam Vitals and nursing note reviewed.  Constitutional:      General: She is not in acute distress.    Appearance: Normal appearance. She is not ill-appearing, toxic-appearing or diaphoretic.  HENT:     Head: Normocephalic and atraumatic.     Nose: Nose normal.     Mouth/Throat:     Mouth: Mucous membranes are moist.     Pharynx: Oropharynx is clear.  Eyes:     Extraocular Movements: Extraocular movements intact.     Conjunctiva/sclera: Conjunctivae normal.     Pupils: Pupils are equal, round, and reactive to light.  Cardiovascular:     Rate and Rhythm: Normal rate and regular rhythm.  Pulmonary:     Effort: Pulmonary effort is normal.     Breath sounds: Wheezing present.  Abdominal:     General: Abdomen is flat.     Tenderness: There is no abdominal tenderness.  Musculoskeletal:     Cervical back: Normal range of motion and neck supple. No tenderness.     Right lower leg: No edema.     Left lower leg: No edema.  Skin:    Capillary Refill: Capillary refill takes less than 2 seconds.  Neurological:     Mental Status: She is alert and oriented to person, place, and time.     ED Results / Procedures / Treatments   Labs (all labs ordered are listed, but only abnormal results are displayed) Labs Reviewed  CBC WITH DIFFERENTIAL/PLATELET - Abnormal; Notable for the following components:      Result Value   Platelets 146 (*)    All other components within normal limits  BASIC METABOLIC PANEL - Abnormal; Notable for the following components:   Potassium 3.3 (*)    CO2 18 (*)    BUN 22 (*)    Calcium 8.3 (*)    All other components within normal limits  RESP PANEL BY RT-PCR (RSV, FLU A&B, COVID)  [id number]  D-DIMER, QUANTITATIVE  PROCALCITONIN  TROPONIN I (HIGH SENSITIVITY)   TROPONIN I (HIGH SENSITIVITY)    EKG EKG Interpretation Date/Time:  Saturday September 05 2023 15:13:39 EDT Ventricular Rate:  84 PR Interval:  140 QRS Duration:  100 QT Interval:  391 QTC Calculation: 463 R Axis:   81  Text Interpretation: Sinus rhythm Right atrial enlargement Confirmed by Beckey Downing [phone]) on 09/05/2023 4:14:39 PM  Radiology DG Chest 2 View  Result Date: 09/05/2023 CLINICAL DATA:  Shortness of breath. EXAM: CHEST - 2 VIEW COMPARISON:  Chest radiograph dated 03/25/2022. FINDINGS: No focal consolidation, pleural effusion, or pneumothorax. Biopsy clip in the left upper lobe. The cardiac silhouette is within normal limits. No acute osseous pathology. IMPRESSION: No active cardiopulmonary disease.  Electronically Signed   By: Elgie Collard M.D.   On: 09/05/2023 16:00    Procedures .Critical Care  Performed by: Al Decant, PA-C Authorized by: Al Decant, PA-C   Critical care provider statement:    Critical care time (minutes):  75   Critical care time was exclusive of:  Separately billable procedures and treating other patients   Critical care was necessary to treat or prevent imminent or life-threatening deterioration of the following conditions:  Respiratory failure   Critical care was time spent personally by me on the following activities:  Blood draw for specimens, development of treatment plan with patient or surrogate, discussions with consultants, discussions with primary provider, evaluation of patient's response to treatment, examination of patient, ordering and performing treatments and interventions, ordering and review of laboratory studies, ordering and review of radiographic studies, pulse oximetry, re-evaluation of patient's condition and obtaining history from patient or surrogate   I assumed direction of critical care for this patient from another provider in my specialty: no     Care discussed with: admitting provider       Medications Ordered in ED Medications  albuterol (PROVENTIL,VENTOLIN) solution continuous neb (10 mg/hr Nebulization Not Given 09/05/23 1730)  ipratropium-albuterol (DUONEB) 0.5-2.5 (3) MG/3ML nebulizer solution 3 mL (3 mLs Nebulization Given 09/05/23 1634)  methylPREDNISolone sodium succinate (SOLU-MEDROL) 125 mg/2 mL injection 125 mg (125 mg Intravenous Given 09/05/23 1619)  albuterol (PROVENTIL) (2.5 MG/3ML) 0.083% nebulizer solution (10 mg  Given 09/05/23 1734)  acetaminophen (TYLENOL) tablet 650 mg (650 mg Oral Given 09/05/23 1754)  cefTRIAXone (ROCEPHIN) 1 g in sodium chloride 0.9 % 100 mL IVPB (0 g Intravenous Stopped 09/05/23 2117)  doxycycline (VIBRA-TABS) tablet 100 mg (100 mg Oral Given 09/05/23 2041)    ED Course/ Medical Decision Making/ A&P Clinical Course as of 09/05/23 2120  Sat Sep 05, 2023  1603 88% on room air with EMS [CG]  1708 Patient reassessed and continues to wheeze. Continuous duoneb 10/hr ordered. [CG]    Clinical Course User Index [CG] Al Decant, PA-C   Medical Decision Making Amount and/or Complexity of Data Reviewed Labs: ordered. Radiology: ordered.  Risk Prescription drug management.   52 year old female presents to the ED for evaluation.  Please see HPI for further details.  On examination patient is afebrile, nontachycardic.  Her lung sounds have wheezing throughout, she is hypoxic on room air with oxygen saturation 88%.  Patient placed on 2 L of oxygen via nasal cannula.  Patient abdomen soft and compressible throughout.  No edema to bilateral lower extremities.  Neurological examination at baseline.  Patient initially treated for possible pneumonia, given doxycycline.  Patient was also diagnosed with COPD that time but given no steroids.  Differential includes pneumonia, COPD exacerbation, asthma, PE.  Patient CBC without leukocytosis, no anemia.  Patient viral panel negative for all.  Metabolic panel shows potassium 3.3 however no other  electrolyte derangement.  Patient troponin 2.  D-dimer not elevated at 0.  3 4.  Chest x-ray shows no consolidations or effusions.  At this time I favor COPD exacerbation/asthma attack.  D-dimer not elevated so doubt PE.  Chest x-ray clean with no leukocytosis so doubt pneumonia.  Patient on day 6 of doxycycline so we will continue to treat and cover for possible CAP.  Do not have CT scanner modality at this time.  Patient given DuoNeb, 125 Solu-Medrol.  On reassessment patient continues to wheeze.  Continuous duo nebulizer administered at this time however patient continues  to have wheezing.  Patient ambulated in becomes tachycardic as well as hypoxic.  Will admit patient for further management and care at this time.  Patient admitted to Dr. Adrian Blackwater, hospitalist.  Final Clinical Impression(s) / ED Diagnoses Final diagnoses:  COPD exacerbation [facility])  Hypoxia    Rx / DC Orders ED Discharge Orders     None         Clent Ridges 09/05/23 2120    Durwin Glaze, MD 09/05/23 2356

## 2023-09-05 NOTE — ED Triage Notes (Signed)
Pt BIB EMS for increased SOB. Pt was seen virtually last Monday and diagnosed with Pneumonia without confirmation since it was virtual. Pt was prescribed doxycycline twice a day today being her 6th day of doses. Pt states increased SOB today and history of COPD.

## 2023-09-05 NOTE — ED Notes (Signed)
See triage notes. No resp distress or obvious shob noted during assessment. Pt a/o. Color wnl. Pt slightly shaky from breathing tx by ems. Pt c/o headache from coughing. Denies blurred vision.

## 2023-09-06 DIAGNOSIS — J9601 Acute respiratory failure with hypoxia: Secondary | ICD-10-CM | POA: Diagnosis not present

## 2023-09-06 LAB — CBC
HCT: 37.6 % (ref 36.0–46.0)
Hemoglobin: 12.5 g/dL (ref 12.0–15.0)
MCH: 30.2 pg (ref 26.0–34.0)
MCHC: 33.2 g/dL (ref 30.0–36.0)
MCV: 90.8 fL (ref 80.0–100.0)
Platelets: 165 10*3/uL (ref 150–400)
RBC: 4.14 MIL/uL (ref 3.87–5.11)
RDW: 13.2 % (ref 11.5–15.5)
WBC: 3 10*3/uL — ABNORMAL LOW (ref 4.0–10.5)
nRBC: 0 % (ref 0.0–0.2)

## 2023-09-06 LAB — HIV ANTIBODY (ROUTINE TESTING W REFLEX): HIV Screen 4th Generation wRfx: NONREACTIVE

## 2023-09-06 MED ORDER — POTASSIUM CHLORIDE CRYS ER 20 MEQ PO TBCR
40.0000 meq | EXTENDED_RELEASE_TABLET | Freq: Once | ORAL | Status: AC
  Administered 2023-09-06: 40 meq via ORAL
  Filled 2023-09-06: qty 2

## 2023-09-06 MED ORDER — ACETAMINOPHEN 325 MG PO TABS
650.0000 mg | ORAL_TABLET | Freq: Four times a day (QID) | ORAL | Status: DC | PRN
  Administered 2023-09-06 – 2023-09-07 (×2): 650 mg via ORAL
  Filled 2023-09-06 (×2): qty 2

## 2023-09-06 NOTE — Progress Notes (Signed)
PROGRESS NOTE    Vicki Mcmillan  WUJ:[id number] DOB: 18-Aug-1971 DOA: 09/05/2023 PCP: Keriann Manson, NP   Brief Narrative:    Vicki Mcmillan is a 52 y.o. female with medical history significant of COPD, anxiety/depression.  Patient seen for shortness of breath.  She has been admitted with acute hypoxemic respiratory failure secondary to acute COPD exacerbation.  Assessment & Plan:   Principal Problem:   Acute respiratory failure with hypoxia (HCC) Active Problems:   GERD (gastroesophageal reflux disease)   COPD with acute exacerbation (HCC)  Assessment and Plan:   Acute respiratory failure with hypoxia COPD with acute exacerbation Antibiotics: doxycycline DuoNeb's every 6 scheduled with albuterol every 2 when necessary Continue inhaled steroids and LA bronchodilator Solu-Medrol 60 mg IV every 12 hours Mucinex Will check procalcitonin GERD Hypokalemia Replete and reevaluate   DVT prophylaxis:Lovenox Code Status: Full Family Communication: None at bedside Disposition Plan:  Status is: Inpatient Remains inpatient appropriate because: Need for IV medications   Consultants:  None  Procedures:  None  Antimicrobials:  Anti-infectives (From admission, onward)    Start     Dose/Rate Route Frequency Ordered Stop   09/06/23 1000  doxycycline (VIBRA-TABS) tablet 100 mg       Note to Pharmacy: For 10 days     100 mg Oral 2 times daily 09/05/23 2140 09/10/23 0959   09/05/23 2015  cefTRIAXone (ROCEPHIN) 1 g in sodium chloride 0.9 % 100 mL IVPB        1 g 200 mL/hr over 30 Minutes Intravenous  Once 09/05/23 2014 09/05/23 2117   09/05/23 2015  doxycycline (VIBRA-TABS) tablet 100 mg        100 mg Oral  Once 09/05/23 2014 09/05/23 2041      Subjective: Patient seen and evaluated today with ongoing wheezing and shortness of breath but is slowly improving.  No acute events noted since admission.  Objective: Vitals:   09/05/23 2216 09/06/23 0227 09/06/23 0758 09/06/23  0806  BP:      Pulse:      Resp:      Temp:      TempSrc:      SpO2: 95% 95% 95% 95%  Weight:      Height:        Intake/Output Summary (Last 24 hours) at 09/06/2023 1211 Last data filed at 09/06/2023 0459 Gross per 24 hour  Intake 240 ml  Output --  Net 240 ml   Filed Weights   09/05/23 1521 09/05/23 2141  Weight: 59 kg 57.1 kg    Examination:  General exam: Appears calm and comfortable  Respiratory system: Wheezing bilaterally. Respiratory effort normal.  2 L nasal cannula Cardiovascular system: S1 & S2 heard, RRR.  Gastrointestinal system: Abdomen is soft Central nervous system: Alert and awake Extremities: No edema Skin: No significant lesions noted Psychiatry: Flat affect.    Data Reviewed: I have personally reviewed following labs and imaging studies  CBC: Recent Labs  Lab 09/05/23 1531 09/06/23 0455  WBC 4.2 3.0*  NEUTROABS 2.9  --   HGB 13.5 12.5  HCT 40.1 37.6  MCV 89.1 90.8  PLT 146* 165   Basic Metabolic Panel: Recent Labs  Lab 09/05/23 1531  NA 137  K 3.3*  CL 109  CO2 18*  GLUCOSE 94  BUN 22*  CREATININE 0.85  CALCIUM 8.3*   GFR: Estimated Creatinine Clearance: 57.8 mL/min (by C-G formula based on SCr of 0.85 mg/dL). Liver Function Tests: No results for input(s): "AST", "ALT", "  ALKPHOS", "BILITOT", "PROT", "ALBUMIN" in the last 168 hours. No results for input(s): "LIPASE", "AMYLASE" in the last 168 hours. No results for input(s): "AMMONIA" in the last 168 hours. Coagulation Profile: No results for input(s): "INR", "PROTIME" in the last 168 hours. Cardiac Enzymes: No results for input(s): "CKTOTAL", "CKMB", "CKMBINDEX", "TROPONINI" in the last 168 hours. BNP (last 3 results) No results for input(s): "PROBNP" in the last 8760 hours. HbA1C: No results for input(s): "HGBA1C" in the last 72 hours. CBG: No results for input(s): "GLUCAP" in the last 168 hours. Lipid Profile: No results for input(s): "CHOL", "HDL", "LDLCALC", "TRIG",  "CHOLHDL", "LDLDIRECT" in the last 72 hours. Thyroid Function Tests: No results for input(s): "TSH", "T4TOTAL", "FREET4", "T3FREE", "THYROIDAB" in the last 72 hours. Anemia Panel: No results for input(s): "VITAMINB12", "FOLATE", "FERRITIN", "TIBC", "IRON", "RETICCTPCT" in the last 72 hours. Sepsis Labs: Recent Labs  Lab 09/05/23 1806  PROCALCITON <0.10    Recent Results (from the past 240 hour(s))  Resp panel by RT-PCR (RSV, Flu A&B, Covid) Anterior Nasal Swab     Status: None   Collection Time: 09/05/23  4:17 PM   Specimen: Anterior Nasal Swab  Result Value Ref Range Status   SARS Coronavirus 2 by RT PCR NEGATIVE NEGATIVE Final    Comment: (NOTE) SARS-CoV-2 target nucleic acids are NOT DETECTED.  The SARS-CoV-2 RNA is generally detectable in upper respiratory specimens during the acute phase of infection. The lowest concentration of SARS-CoV-2 viral copies this assay can detect is 138 copies/mL. A negative result does not preclude SARS-Cov-2 infection and should not be used as the sole basis for treatment or other patient management decisions. A negative result may occur with  improper specimen collection/handling, submission of specimen other than nasopharyngeal swab, presence of viral mutation(s) within the areas targeted by this assay, and inadequate number of viral copies(<138 copies/mL). A negative result must be combined with clinical observations, patient history, and epidemiological information. The expected result is Negative.  Fact Sheet for Patients:  BloggerCourse.com  Fact Sheet for Healthcare Providers:  SeriousBroker.it  This test is no t yet approved or cleared by the [place] FDA and  has been authorized for detection and/or diagnosis of SARS-CoV-2 by FDA under an Emergency Use Authorization (EUA). This EUA will remain  in effect (meaning this test can be used) for the duration of the COVID-19  declaration under Section 564(b)(1) of the Act, 21 U.S.C.section 360bbb-3(b)(1), unless the authorization is terminated  or revoked sooner.       Influenza A by PCR NEGATIVE NEGATIVE Final   Influenza B by PCR NEGATIVE NEGATIVE Final    Comment: (NOTE) The Xpert Xpress SARS-CoV-2/FLU/RSV plus assay is intended as an aid in the diagnosis of influenza from Nasopharyngeal swab specimens and should not be used as a sole basis for treatment. Nasal washings and aspirates are unacceptable for Xpert Xpress SARS-CoV-2/FLU/RSV testing.  Fact Sheet for Patients: BloggerCourse.com  Fact Sheet for Healthcare Providers: SeriousBroker.it  This test is not yet approved or cleared by the [place] FDA and has been authorized for detection and/or diagnosis of SARS-CoV-2 by FDA under an Emergency Use Authorization (EUA). This EUA will remain in effect (meaning this test can be used) for the duration of the COVID-19 declaration under Section 564(b)(1) of the Act, 21 U.S.C. section 360bbb-3(b)(1), unless the authorization is terminated or revoked.     Resp Syncytial Virus by PCR NEGATIVE NEGATIVE Final    Comment: (NOTE) Fact Sheet for Patients: BloggerCourse.com  Fact Sheet  for Healthcare Providers: SeriousBroker.it  This test is not yet approved or cleared by the [place] and has been authorized for detection and/or diagnosis of SARS-CoV-2 by FDA under an Emergency Use Authorization (EUA). This EUA will remain in effect (meaning this test can be used) for the duration of the COVID-19 declaration under Section 564(b)(1) of the Act, 21 U.S.C. section 360bbb-3(b)(1), unless the authorization is terminated or revoked.  Performed at [facility], [address], [place], [place] [address]          Radiology Studies: DG Chest 2 View  Result Date: 09/05/2023 CLINICAL DATA:   Shortness of breath. EXAM: CHEST - 2 VIEW COMPARISON:  Chest radiograph dated 03/25/2022. FINDINGS: No focal consolidation, pleural effusion, or pneumothorax. Biopsy clip in the left upper lobe. The cardiac silhouette is within normal limits. No acute osseous pathology. IMPRESSION: No active cardiopulmonary disease. Electronically Signed   By: Elgie Collard M.D.   On: 09/05/2023 16:00        Scheduled Meds:  albuterol  10 mg/hr Nebulization Once   doxycycline  100 mg Oral BID   enoxaparin (LOVENOX) injection  40 mg Subcutaneous Q24H   fluticasone furoate-vilanterol  1 puff Inhalation Daily   And   umeclidinium bromide  1 puff Inhalation Daily   ipratropium-albuterol  3 mL Nebulization Q6H   pantoprazole  80 mg Oral QHS   potassium chloride  40 mEq Oral Once   [START ON 09/07/2023] predniSONE  40 mg Oral Q breakfast   sertraline  150 mg Oral QHS   topiramate  50 mg Oral QHS    LOS: 1 day    Time spent: 35 minutes    Elsi Stelzer Hoover Brunette, DO [facility]  If 7PM-7AM, please contact night-coverage [email or web address] 09/06/2023, 12:12 PM

## 2023-09-07 DIAGNOSIS — J9601 Acute respiratory failure with hypoxia: Secondary | ICD-10-CM | POA: Diagnosis not present

## 2023-09-07 LAB — BASIC METABOLIC PANEL
Anion gap: 5 (ref 5–15)
BUN: 31 mg/dL — ABNORMAL HIGH (ref 6–20)
CO2: 25 mmol/L (ref 22–32)
Calcium: 8.5 mg/dL — ABNORMAL LOW (ref 8.9–10.3)
Chloride: 110 mmol/L (ref 98–111)
Creatinine, Ser: 0.9 mg/dL (ref 0.44–1.00)
GFR, Estimated: >60 mL/min (ref >60)
Glucose, Bld: 86 mg/dL (ref 70–99)
Potassium: 4.3 mmol/L (ref 3.5–5.1)
Sodium: 140 mmol/L (ref 135–145)

## 2023-09-07 LAB — CBC
HCT: 37.9 % (ref 36.0–46.0)
Hemoglobin: 12.2 g/dL (ref 12.0–15.0)
MCH: 29.4 pg (ref 26.0–34.0)
MCHC: 32.2 g/dL (ref 30.0–36.0)
MCV: 91.3 fL (ref 80.0–100.0)
Platelets: 175 10*3/uL (ref 150–400)
RBC: 4.15 MIL/uL (ref 3.87–5.11)
RDW: 13.2 % (ref 11.5–15.5)
WBC: 5.7 10*3/uL (ref 4.0–10.5)
nRBC: 0 % (ref 0.0–0.2)

## 2023-09-07 LAB — MAGNESIUM: Magnesium: 2 mg/dL (ref 1.7–2.4)

## 2023-09-07 MED ORDER — ADULT MULTIVITAMIN W/MINERALS CH
1.0000 | ORAL_TABLET | Freq: Every day | ORAL | Status: DC
Start: 2023-09-07 — End: 2023-09-08
  Administered 2023-09-07 – 2023-09-08 (×2): 1 via ORAL
  Filled 2023-09-07 (×2): qty 1

## 2023-09-07 MED ORDER — DM-GUAIFENESIN ER 30-600 MG PO TB12
1.0000 | ORAL_TABLET | Freq: Two times a day (BID) | ORAL | Status: DC
Start: 2023-09-07 — End: 2023-09-08
  Administered 2023-09-07 – 2023-09-08 (×3): 1 via ORAL
  Filled 2023-09-07 (×3): qty 1

## 2023-09-07 MED ORDER — METHYLPREDNISOLONE SODIUM SUCC 40 MG IJ SOLR
40.0000 mg | Freq: Two times a day (BID) | INTRAMUSCULAR | Status: DC
Start: 2023-09-07 — End: 2023-09-08
  Administered 2023-09-07 – 2023-09-08 (×2): 40 mg via INTRAVENOUS
  Filled 2023-09-07 (×2): qty 1

## 2023-09-07 NOTE — Progress Notes (Signed)
Initial Nutrition Assessment  DOCUMENTATION CODES:   Not applicable  INTERVENTION:   Chocolate Magic cup TID with meals, each supplement provides 290 kcal and 9 grams of protein. MVI with minerals daily.  NUTRITION DIAGNOSIS:   Increased nutrient needs related to chronic illness (COPD) as evidenced by estimated needs.  GOAL:   Patient will meet greater than or equal to 90% of their needs  MONITOR:   PO intake, Supplement acceptance  REASON FOR ASSESSMENT:   Malnutrition Screening Tool    ASSESSMENT:   52 yo female admitted with COPD exacerbation. PMH includes COPD, anxiety, MI, asthma, GERD, squamous skin cancer, cervical cancer.  Patient states that she has lost some weight recently d/t being sick. Weight history reviewed. 3% weight loss within 2 months is not significant. Since admission, she has gradually been able to eat greater amounts.  She does not like ensure or boost supplements, but agreed to try chocolate magic cups with meals to increase protein and calorie intake.   Patient was requiring 2 L oxygen via nasal cannula this morning, but has been on room air this afternoon since ~1pm.  Labs reviewed. Medications reviewed and include solu-medrol, protonix.  NUTRITION - FOCUSED PHYSICAL EXAM:  Flowsheet Row Most Recent Value  Orbital Region No depletion  Upper Arm Region No depletion  Thoracic and Lumbar Region No depletion  Buccal Region No depletion  Temple Region No depletion  Clavicle Bone Region No depletion  Clavicle and Acromion Bone Region No depletion  Scapular Bone Region No depletion  Dorsal Hand No depletion  Patellar Region Unable to assess  Anterior Thigh Region Unable to assess  Posterior Calf Region Mild depletion  Edema (RD Assessment) None  Hair Reviewed  Eyes Reviewed  Mouth Reviewed  Skin Reviewed  Nails Reviewed       Diet Order:   Diet Order             Diet Heart Room service appropriate? Yes; Fluid consistency: Thin   Diet effective now                   EDUCATION NEEDS:   Education needs have been addressed  Skin:  Skin Assessment: Reviewed RN Assessment  Last BM:  unknown  Height:   Ht Readings from Last 1 Encounters:  09/05/23 5\' 1"  (1.549 m)    Weight:   Wt Readings from Last 1 Encounters:  09/05/23 57.1 kg    Ideal Body Weight:  47.7 kg  BMI:  Body mass index is 23.79 kg/m.  Estimated Nutritional Needs:   Kcal:  1600-1800  Protein:  70-85 gm  Fluid:  1.6-1.8 L   Gabriel Rainwater RD, LDN, CNSC Please refer to Amion for contact information.

## 2023-09-07 NOTE — Progress Notes (Signed)
   09/07/23 1003  TOC Brief Assessment  Insurance and Status Reviewed  Patient has primary care physician Yes  Home environment has been reviewed from home  Prior level of function: independent  Prior/Current Home Services No current home services  Social Determinants of Health Reivew SDOH reviewed no interventions necessary  Readmission risk has been reviewed Yes  Transition of care needs no transition of care needs at this time    Transition of Care Department [facility]) has reviewed patient and no TOC needs have been identified at this time. We will continue to monitor patient advancement through interdisciplinary progression rounds. If new patient transition needs arise, please place a TOC consult.

## 2023-09-07 NOTE — Progress Notes (Signed)
PROGRESS NOTE    Vicki Mcmillan  WUJ:[id number] DOB: 1971/02/15 DOA: 09/05/2023 PCP: Lidie Manson, NP   Brief Narrative:    Vicki Mcmillan is a 52 y.o. female with medical history significant of COPD, anxiety/depression.  Patient seen for shortness of breath.  She has been admitted with acute hypoxemic respiratory failure secondary to acute COPD exacerbation.  Assessment & Plan:   Principal Problem:   Acute respiratory failure with hypoxia (HCC) Active Problems:   GERD (gastroesophageal reflux disease)   COPD with acute exacerbation (HCC)  Assessment and Plan:   Acute respiratory failure with hypoxia COPD with acute exacerbation Antibiotics: doxycycline DuoNeb's every 6 scheduled with albuterol every 2 when necessary Continue inhaled steroids and LA bronchodilator Solu-Medrol 40 mg IV every 12 hours Mucinex Procal low GERD   DVT prophylaxis:Lovenox Code Status: Full Family Communication: None at bedside Disposition Plan:  Status is: Inpatient Remains inpatient appropriate because: Need for IV medications   Consultants:  None  Procedures:  None  Antimicrobials:  Anti-infectives (From admission, onward)    Start     Dose/Rate Route Frequency Ordered Stop   09/06/23 1000  doxycycline (VIBRA-TABS) tablet 100 mg       Note to Pharmacy: For 10 days     100 mg Oral 2 times daily 09/05/23 2140 09/10/23 0959   09/05/23 2015  cefTRIAXone (ROCEPHIN) 1 g in sodium chloride 0.9 % 100 mL IVPB        1 g 200 mL/hr over 30 Minutes Intravenous  Once 09/05/23 2014 09/05/23 2117   09/05/23 2015  doxycycline (VIBRA-TABS) tablet 100 mg        100 mg Oral  Once 09/05/23 2014 09/05/23 2041      Subjective: Patient seen and evaluated today with ongoing wheezing and shortness of breath but is slowly improving.  No acute events noted since admission.  She still does not feel as though she is back to baseline.  Objective: Vitals:   09/06/23 2059 09/07/23 0133 09/07/23 0624  09/07/23 0808  BP: 123/87  (!) 127/90   Pulse: 71  65   Resp: 18  20   Temp: 97.9 F (36.6 C)  98.2 F (36.8 C)   TempSrc: Oral  Oral   SpO2: 99% 98% 99% 98%  Weight:      Height:        Intake/Output Summary (Last 24 hours) at 09/07/2023 1101 Last data filed at 09/07/2023 0900 Gross per 24 hour  Intake 240 ml  Output --  Net 240 ml   Filed Weights   09/05/23 1521 09/05/23 2141  Weight: 59 kg 57.1 kg    Examination:  General exam: Appears calm and comfortable  Respiratory system: Wheezing bilaterally. Respiratory effort normal.  2 L nasal cannula Cardiovascular system: S1 & S2 heard, RRR.  Gastrointestinal system: Abdomen is soft Central nervous system: Alert and awake Extremities: No edema Skin: No significant lesions noted Psychiatry: Flat affect.    Data Reviewed: I have personally reviewed following labs and imaging studies  CBC: Recent Labs  Lab 09/05/23 1531 09/06/23 0455 09/07/23 0443  WBC 4.2 3.0* 5.7  NEUTROABS 2.9  --   --   HGB 13.5 12.5 12.2  HCT 40.1 37.6 37.9  MCV 89.1 90.8 91.3  PLT 146* 165 175   Basic Metabolic Panel: Recent Labs  Lab 09/05/23 1531 09/07/23 0443  NA 137 140  K 3.3* 4.3  CL 109 110  CO2 18* 25  GLUCOSE 94 86  BUN 22*  31*  CREATININE 0.85 0.90  CALCIUM 8.3* 8.5*  MG  --  2.0   GFR: Estimated Creatinine Clearance: 54.5 mL/min (by C-G formula based on SCr of 0.9 mg/dL). Liver Function Tests: No results for input(s): "AST", "ALT", "ALKPHOS", "BILITOT", "PROT", "ALBUMIN" in the last 168 hours. No results for input(s): "LIPASE", "AMYLASE" in the last 168 hours. No results for input(s): "AMMONIA" in the last 168 hours. Coagulation Profile: No results for input(s): "INR", "PROTIME" in the last 168 hours. Cardiac Enzymes: No results for input(s): "CKTOTAL", "CKMB", "CKMBINDEX", "TROPONINI" in the last 168 hours. BNP (last 3 results) No results for input(s): "PROBNP" in the last 8760 hours. HbA1C: No results for  input(s): "HGBA1C" in the last 72 hours. CBG: No results for input(s): "GLUCAP" in the last 168 hours. Lipid Profile: No results for input(s): "CHOL", "HDL", "LDLCALC", "TRIG", "CHOLHDL", "LDLDIRECT" in the last 72 hours. Thyroid Function Tests: No results for input(s): "TSH", "T4TOTAL", "FREET4", "T3FREE", "THYROIDAB" in the last 72 hours. Anemia Panel: No results for input(s): "VITAMINB12", "FOLATE", "FERRITIN", "TIBC", "IRON", "RETICCTPCT" in the last 72 hours. Sepsis Labs: Recent Labs  Lab 09/05/23 1806  PROCALCITON <0.10    Recent Results (from the past 240 hour(s))  Resp panel by RT-PCR (RSV, Flu A&B, Covid) Anterior Nasal Swab     Status: None   Collection Time: 09/05/23  4:17 PM   Specimen: Anterior Nasal Swab  Result Value Ref Range Status   SARS Coronavirus 2 by RT PCR NEGATIVE NEGATIVE Final    Comment: (NOTE) SARS-CoV-2 target nucleic acids are NOT DETECTED.  The SARS-CoV-2 RNA is generally detectable in upper respiratory specimens during the acute phase of infection. The lowest concentration of SARS-CoV-2 viral copies this assay can detect is 138 copies/mL. A negative result does not preclude SARS-Cov-2 infection and should not be used as the sole basis for treatment or other patient management decisions. A negative result may occur with  improper specimen collection/handling, submission of specimen other than nasopharyngeal swab, presence of viral mutation(s) within the areas targeted by this assay, and inadequate number of viral copies(<138 copies/mL). A negative result must be combined with clinical observations, patient history, and epidemiological information. The expected result is Negative.  Fact Sheet for Patients:  BloggerCourse.com  Fact Sheet for Healthcare Providers:  SeriousBroker.it  This test is no t yet approved or cleared by the [place] FDA and  has been authorized for detection and/or  diagnosis of SARS-CoV-2 by FDA under an Emergency Use Authorization (EUA). This EUA will remain  in effect (meaning this test can be used) for the duration of the COVID-19 declaration under Section 564(b)(1) of the Act, 21 U.S.C.section 360bbb-3(b)(1), unless the authorization is terminated  or revoked sooner.       Influenza A by PCR NEGATIVE NEGATIVE Final   Influenza B by PCR NEGATIVE NEGATIVE Final    Comment: (NOTE) The Xpert Xpress SARS-CoV-2/FLU/RSV plus assay is intended as an aid in the diagnosis of influenza from Nasopharyngeal swab specimens and should not be used as a sole basis for treatment. Nasal washings and aspirates are unacceptable for Xpert Xpress SARS-CoV-2/FLU/RSV testing.  Fact Sheet for Patients: BloggerCourse.com  Fact Sheet for Healthcare Providers: SeriousBroker.it  This test is not yet approved or cleared by the [place] FDA and has been authorized for detection and/or diagnosis of SARS-CoV-2 by FDA under an Emergency Use Authorization (EUA). This EUA will remain in effect (meaning this test can be used) for the duration of the COVID-19 declaration under  Section 564(b)(1) of the Act, 21 U.S.C. section 360bbb-3(b)(1), unless the authorization is terminated or revoked.     Resp Syncytial Virus by PCR NEGATIVE NEGATIVE Final    Comment: (NOTE) Fact Sheet for Patients: BloggerCourse.com  Fact Sheet for Healthcare Providers: SeriousBroker.it  This test is not yet approved or cleared by the [place] FDA and has been authorized for detection and/or diagnosis of SARS-CoV-2 by FDA under an Emergency Use Authorization (EUA). This EUA will remain in effect (meaning this test can be used) for the duration of the COVID-19 declaration under Section 564(b)(1) of the Act, 21 U.S.C. section 360bbb-3(b)(1), unless the authorization is terminated  or revoked.  Performed at [facility], [address], [place], [place] [address]          Radiology Studies: DG Chest 2 View  Result Date: 09/05/2023 CLINICAL DATA:  Shortness of breath. EXAM: CHEST - 2 VIEW COMPARISON:  Chest radiograph dated 03/25/2022. FINDINGS: No focal consolidation, pleural effusion, or pneumothorax. Biopsy clip in the left upper lobe. The cardiac silhouette is within normal limits. No acute osseous pathology. IMPRESSION: No active cardiopulmonary disease. Electronically Signed   By: Elgie Collard M.D.   On: 09/05/2023 16:00        Scheduled Meds:  albuterol  10 mg/hr Nebulization Once   dextromethorphan-guaiFENesin  1 tablet Oral BID   doxycycline  100 mg Oral BID   enoxaparin (LOVENOX) injection  40 mg Subcutaneous Q24H   fluticasone furoate-vilanterol  1 puff Inhalation Daily   And   umeclidinium bromide  1 puff Inhalation Daily   ipratropium-albuterol  3 mL Nebulization Q6H   methylPREDNISolone (SOLU-MEDROL) injection  40 mg Intravenous Q12H   pantoprazole  80 mg Oral QHS   sertraline  150 mg Oral QHS   topiramate  50 mg Oral QHS    LOS: 2 days    Time spent: 35 minutes    Kimberlynn Lumbra Hoover Brunette, DO [facility]  If 7PM-7AM, please contact night-coverage [email or web address] 09/07/2023, 11:01 AM

## 2023-09-07 NOTE — Progress Notes (Signed)
Mobility Specialist Progress Note:    09/07/23 1150  Mobility  Activity Ambulated with assistance in room  Level of Assistance Independent  Assistive Device None  Distance Ambulated (ft) 15 ft  Range of Motion/Exercises Active;All extremities  Activity Response Tolerated well  Mobility Referral Yes  $Mobility charge 1 Mobility  Mobility Specialist Start Time (ACUTE ONLY) 1150  Mobility Specialist Stop Time (ACUTE ONLY) 1205  Mobility Specialist Time Calculation (min) (ACUTE ONLY) 15 min   Pt received in bed, agreeable to mobility. Independently able to stand and ambulate with no AD. Tolerated well, attempted to ambulate while monitoring SpO2. Deferred d/t SOB, SpO2 90% on 1L, pt could not catch breath. Returned pt sitting EOB, recovered, SpO2 94% on 1L. All needs met.   Lawerance Bach Mobility Specialist Please contact via Special educational needs teacher or  Rehab office at [phone]

## 2023-09-07 NOTE — Plan of Care (Signed)
  Problem: Activity: Goal: Risk for activity intolerance will decrease Outcome: Progressing   Problem: Coping: Goal: Level of anxiety will decrease Outcome: Progressing   Problem: Pain Management: Goal: General experience of comfort will improve Outcome: Progressing

## 2023-09-07 NOTE — Plan of Care (Signed)

## 2023-09-08 DIAGNOSIS — J9601 Acute respiratory failure with hypoxia: Secondary | ICD-10-CM | POA: Diagnosis not present

## 2023-09-08 MED ORDER — IPRATROPIUM-ALBUTEROL 0.5-2.5 (3) MG/3ML IN SOLN: 3.0000 mL | Freq: Two times a day (BID) | RESPIRATORY_TRACT | 2 refills | Status: DC | PRN

## 2023-09-08 MED ORDER — DM-GUAIFENESIN ER 30-600 MG PO TB12: 1.0000 | ORAL_TABLET | Freq: Two times a day (BID) | ORAL | 0 refills | Status: AC

## 2023-09-08 MED ORDER — DOXYCYCLINE HYCLATE 100 MG PO TABS: 100.0000 mg | ORAL_TABLET | Freq: Two times a day (BID) | ORAL | 0 refills | Status: AC

## 2023-09-08 MED ORDER — PREDNISONE 10 MG PO TABS: 40.0000 mg | ORAL_TABLET | Freq: Every day | ORAL | 0 refills | Status: AC

## 2023-09-08 NOTE — Discharge Summary (Signed)
Physician Discharge Summary  Vicki Mcmillan ZDG:[id number] DOB: Nov 05, 1970 DOA: 09/05/2023  PCP: Cherly Manson, NP  Admit date: 09/05/2023  Discharge date: 09/08/2023  Admitted From:Home  Disposition:  Home  Recommendations for Outpatient Follow-up:  Follow up with PCP in 1-2 weeks Remain on prednisone as prescribed for 5 more days Breathing treatments ordered as needed for sob/wheezing Continue on other home medications as previously prescribed  Home Health:None  Equipment/Devices:None  Discharge Condition:Stable  CODE STATUS: Full  Diet recommendation: Heart Healthy  Brief/Interim Summary: Vicki Mcmillan is a 52 y.o. female with medical history significant of COPD, anxiety/depression.  Patient seen for shortness of breath.  She has been admitted with acute hypoxemic respiratory failure secondary to acute COPD exacerbation. This has now resolved and she is no longer on oxygen and is in stable condition for discharge.  Discharge Diagnoses:  Principal Problem:   Acute respiratory failure with hypoxia (HCC) Active Problems:   GERD (gastroesophageal reflux disease)   COPD with acute exacerbation (HCC)  Principal discharge diagnosis: Acute hypoxemic respiratory failure secondary to acute COPD exacerbation.  Discharge Instructions  Discharge Instructions     Diet - low sodium heart healthy   Complete by: As directed    Increase activity slowly   Complete by: As directed       Allergies as of 09/08/2023       Reactions   Hydrocodone-acetaminophen Nausea And Vomiting   Tomato Rash   Bupropion Other (See Comments)   hallucinations   Oxycodone Nausea And Vomiting, Other (See Comments)   Stomach upset   Varenicline Other (See Comments)   hallucination        Medication List     TAKE these medications    albuterol 108 (90 Base) MCG/ACT inhaler Commonly known as: VENTOLIN HFA INHALE 1 TO 2 PUFFS INTO THE LUNGS EVERY 6 HOURS AS NEEDED FOR WHEEZING OR  SHORTNESS OF BREATH   cyclobenzaprine 5 MG tablet Commonly known as: FLEXERIL Take 5 mg by mouth at bedtime.   dextromethorphan-guaiFENesin 30-600 MG 12hr tablet Commonly known as: MUCINEX DM Take 1 tablet by mouth 2 (two) times daily for 7 days.   doxycycline 100 MG tablet Commonly known as: VIBRA-TABS Take 1 tablet (100 mg total) by mouth 2 (two) times daily for 3 days. What changed: additional instructions   ibuprofen 200 MG tablet Commonly known as: ADVIL Take 200 mg by mouth every 6 (six) hours as needed for headache.   ipratropium-albuterol 0.5-2.5 (3) MG/3ML Soln Commonly known as: DUONEB Take 3 mLs by nebulization 2 (two) times daily as needed (asthma).   loratadine 10 MG tablet Commonly known as: CLARITIN Take 10 mg by mouth daily.   pantoprazole 40 MG tablet Commonly known as: PROTONIX Take 1 tablet (40 mg total) by mouth daily. What changed:  how much to take when to take this   predniSONE 10 MG tablet Commonly known as: DELTASONE Take 4 tablets (40 mg total) by mouth daily for 5 days.   prochlorperazine 5 MG tablet Commonly known as: COMPAZINE Take 5 mg by mouth every 8 (eight) hours as needed for vomiting or nausea.   rizatriptan 5 MG disintegrating tablet Commonly known as: MAXALT-MLT Take 5 mg by mouth as needed for migraine.   sertraline 100 MG tablet Commonly known as: ZOLOFT Take 1.5 tablets (150 mg total) by mouth daily. What changed: when to take this   tetrahydrozoline 0.05 % ophthalmic solution Place 2 drops into both eyes daily as needed (irritation).  topiramate 50 MG tablet Commonly known as: TOPAMAX Take 50 mg by mouth at bedtime.   Trelegy Ellipta 200-62.5-25 MCG/ACT Aepb Generic drug: Fluticasone-Umeclidin-Vilant INHALE 1 PUFF EVERY DAY        Follow-up Information     Maecyn Manson, NP. Schedule an appointment as soon as possible for a visit in 1 week(s).   Specialty: Family Medicine Contact information: [address] [place] [address] [phone]                Allergies  Allergen Reactions   Hydrocodone-Acetaminophen Nausea And Vomiting   Tomato Rash   Bupropion Other (See Comments)    hallucinations   Oxycodone Nausea And Vomiting and Other (See Comments)    Stomach upset   Varenicline Other (See Comments)    hallucination    Consultations: None   Procedures/Studies: DG Chest 2 View  Result Date: 09/05/2023 CLINICAL DATA:  Shortness of breath. EXAM: CHEST - 2 VIEW COMPARISON:  Chest radiograph dated 03/25/2022. FINDINGS: No focal consolidation, pleural effusion, or pneumothorax. Biopsy clip in the left upper lobe. The cardiac silhouette is within normal limits. No acute osseous pathology. IMPRESSION: No active cardiopulmonary disease. Electronically Signed   By: Elgie Collard M.D.   On: 09/05/2023 16:00     Discharge Exam: Vitals:   09/08/23 0417 09/08/23 0806  BP: 114/77   Pulse: 65   Resp: 16   Temp: 97.8 F (36.6 C)   SpO2: 94% 96%   Vitals:   09/07/23 2017 09/08/23 0158 09/08/23 0417 09/08/23 0806  BP: 122/86  114/77   Pulse: 82  65   Resp: 18  16   Temp: 98.9 F (37.2 C)  97.8 F (36.6 C)   TempSrc: Oral  Oral   SpO2: 94% 92% 94% 96%  Weight:      Height:        General: Pt is alert, awake, not in acute distress Cardiovascular: RRR, S1/S2 +, no rubs, no gallops Respiratory: CTA bilaterally, no wheezing, no rhonchi Abdominal: Soft, NT, ND, bowel sounds + Extremities: no edema, no cyanosis    The results of significant diagnostics from this hospitalization (including imaging, microbiology, ancillary and laboratory) are listed below for reference.     Microbiology: Recent Results (from the past 240 hour(s))  Resp panel by RT-PCR (RSV, Flu A&B, Covid) Anterior Nasal Swab     Status: None   Collection Time: 09/05/23  4:17 PM   Specimen: Anterior Nasal Swab  Result Value Ref Range Status   SARS Coronavirus 2 by RT PCR NEGATIVE  NEGATIVE Final    Comment: (NOTE) SARS-CoV-2 target nucleic acids are NOT DETECTED.  The SARS-CoV-2 RNA is generally detectable in upper respiratory specimens during the acute phase of infection. The lowest concentration of SARS-CoV-2 viral copies this assay can detect is 138 copies/mL. A negative result does not preclude SARS-Cov-2 infection and should not be used as the sole basis for treatment or other patient management decisions. A negative result may occur with  improper specimen collection/handling, submission of specimen other than nasopharyngeal swab, presence of viral mutation(s) within the areas targeted by this assay, and inadequate number of viral copies(<138 copies/mL). A negative result must be combined with clinical observations, patient history, and epidemiological information. The expected result is Negative.  Fact Sheet for Patients:  BloggerCourse.com  Fact Sheet for Healthcare Providers:  SeriousBroker.it  This test is no t yet approved or cleared by the [place] FDA and  has  been authorized for detection and/or diagnosis of SARS-CoV-2 by FDA under an Emergency Use Authorization (EUA). This EUA will remain  in effect (meaning this test can be used) for the duration of the COVID-19 declaration under Section 564(b)(1) of the Act, 21 U.S.C.section 360bbb-3(b)(1), unless the authorization is terminated  or revoked sooner.       Influenza A by PCR NEGATIVE NEGATIVE Final   Influenza B by PCR NEGATIVE NEGATIVE Final    Comment: (NOTE) The Xpert Xpress SARS-CoV-2/FLU/RSV plus assay is intended as an aid in the diagnosis of influenza from Nasopharyngeal swab specimens and should not be used as a sole basis for treatment. Nasal washings and aspirates are unacceptable for Xpert Xpress SARS-CoV-2/FLU/RSV testing.  Fact Sheet for Patients: BloggerCourse.com  Fact Sheet for Healthcare  Providers: SeriousBroker.it  This test is not yet approved or cleared by the [place] FDA and has been authorized for detection and/or diagnosis of SARS-CoV-2 by FDA under an Emergency Use Authorization (EUA). This EUA will remain in effect (meaning this test can be used) for the duration of the COVID-19 declaration under Section 564(b)(1) of the Act, 21 U.S.C. section 360bbb-3(b)(1), unless the authorization is terminated or revoked.     Resp Syncytial Virus by PCR NEGATIVE NEGATIVE Final    Comment: (NOTE) Fact Sheet for Patients: BloggerCourse.com  Fact Sheet for Healthcare Providers: SeriousBroker.it  This test is not yet approved or cleared by the [place] FDA and has been authorized for detection and/or diagnosis of SARS-CoV-2 by FDA under an Emergency Use Authorization (EUA). This EUA will remain in effect (meaning this test can be used) for the duration of the COVID-19 declaration under Section 564(b)(1) of the Act, 21 U.S.C. section 360bbb-3(b)(1), unless the authorization is terminated or revoked.  Performed at [facility], [address], [place], [place] [address]      Labs: BNP (last 3 results) No results for input(s): "BNP" in the last 8760 hours. Basic Metabolic Panel: Recent Labs  Lab 09/05/23 1531 09/07/23 0443  NA 137 140  K 3.3* 4.3  CL 109 110  CO2 18* 25  GLUCOSE 94 86  BUN 22* 31*  CREATININE 0.85 0.90  CALCIUM 8.3* 8.5*  MG  --  2.0   Liver Function Tests: No results for input(s): "AST", "ALT", "ALKPHOS", "BILITOT", "PROT", "ALBUMIN" in the last 168 hours. No results for input(s): "LIPASE", "AMYLASE" in the last 168 hours. No results for input(s): "AMMONIA" in the last 168 hours. CBC: Recent Labs  Lab 09/05/23 1531 09/06/23 0455 09/07/23 0443  WBC 4.2 3.0* 5.7  NEUTROABS 2.9  --   --   HGB 13.5 12.5 12.2  HCT 40.1 37.6 37.9  MCV 89.1 90.8 91.3   PLT 146* 165 175   Cardiac Enzymes: No results for input(s): "CKTOTAL", "CKMB", "CKMBINDEX", "TROPONINI" in the last 168 hours. BNP: Invalid input(s): "POCBNP" CBG: No results for input(s): "GLUCAP" in the last 168 hours. D-Dimer Recent Labs    09/05/23 1531  DDIMER 0.34   Hgb A1c No results for input(s): "HGBA1C" in the last 72 hours. Lipid Profile No results for input(s): "CHOL", "HDL", "LDLCALC", "TRIG", "CHOLHDL", "LDLDIRECT" in the last 72 hours. Thyroid function studies No results for input(s): "TSH", "T4TOTAL", "T3FREE", "THYROIDAB" in the last 72 hours.  Invalid input(s): "FREET3" Anemia work up No results for input(s): "VITAMINB12", "FOLATE", "FERRITIN", "TIBC", "IRON", "RETICCTPCT" in the last 72 hours. Urinalysis    Component Value Date/Time   COLORURINE YELLOW 07/15/2019 1809   APPEARANCEUR HAZY (A)  07/15/2019 1809   LABSPEC 1.012 07/15/2019 1809   PHURINE 5.0 07/15/2019 1809   GLUCOSEU NEGATIVE 07/15/2019 1809   HGBUR NEGATIVE 07/15/2019 1809   BILIRUBINUR NEGATIVE 07/15/2019 1809   KETONESUR NEGATIVE 07/15/2019 1809   PROTEINUR NEGATIVE 07/15/2019 1809   NITRITE NEGATIVE 07/15/2019 1809   LEUKOCYTESUR MODERATE (A) 07/15/2019 1809   Sepsis Labs Recent Labs  Lab 09/05/23 1531 09/06/23 0455 09/07/23 0443  WBC 4.2 3.0* 5.7   Microbiology Recent Results (from the past 240 hour(s))  Resp panel by RT-PCR (RSV, Flu A&B, Covid) Anterior Nasal Swab     Status: None   Collection Time: 09/05/23  4:17 PM   Specimen: Anterior Nasal Swab  Result Value Ref Range Status   SARS Coronavirus 2 by RT PCR NEGATIVE NEGATIVE Final    Comment: (NOTE) SARS-CoV-2 target nucleic acids are NOT DETECTED.  The SARS-CoV-2 RNA is generally detectable in upper respiratory specimens during the acute phase of infection. The lowest concentration of SARS-CoV-2 viral copies this assay can detect is 138 copies/mL. A negative result does not preclude SARS-Cov-2 infection and  should not be used as the sole basis for treatment or other patient management decisions. A negative result may occur with  improper specimen collection/handling, submission of specimen other than nasopharyngeal swab, presence of viral mutation(s) within the areas targeted by this assay, and inadequate number of viral copies(<138 copies/mL). A negative result must be combined with clinical observations, patient history, and epidemiological information. The expected result is Negative.  Fact Sheet for Patients:  BloggerCourse.com  Fact Sheet for Healthcare Providers:  SeriousBroker.it  This test is no t yet approved or cleared by the [place] FDA and  has been authorized for detection and/or diagnosis of SARS-CoV-2 by FDA under an Emergency Use Authorization (EUA). This EUA will remain  in effect (meaning this test can be used) for the duration of the COVID-19 declaration under Section 564(b)(1) of the Act, 21 U.S.C.section 360bbb-3(b)(1), unless the authorization is terminated  or revoked sooner.       Influenza A by PCR NEGATIVE NEGATIVE Final   Influenza B by PCR NEGATIVE NEGATIVE Final    Comment: (NOTE) The Xpert Xpress SARS-CoV-2/FLU/RSV plus assay is intended as an aid in the diagnosis of influenza from Nasopharyngeal swab specimens and should not be used as a sole basis for treatment. Nasal washings and aspirates are unacceptable for Xpert Xpress SARS-CoV-2/FLU/RSV testing.  Fact Sheet for Patients: BloggerCourse.com  Fact Sheet for Healthcare Providers: SeriousBroker.it  This test is not yet approved or cleared by the [place] FDA and has been authorized for detection and/or diagnosis of SARS-CoV-2 by FDA under an Emergency Use Authorization (EUA). This EUA will remain in effect (meaning this test can be used) for the duration of the COVID-19 declaration  under Section 564(b)(1) of the Act, 21 U.S.C. section 360bbb-3(b)(1), unless the authorization is terminated or revoked.     Resp Syncytial Virus by PCR NEGATIVE NEGATIVE Final    Comment: (NOTE) Fact Sheet for Patients: BloggerCourse.com  Fact Sheet for Healthcare Providers: SeriousBroker.it  This test is not yet approved or cleared by the [place] FDA and has been authorized for detection and/or diagnosis of SARS-CoV-2 by FDA under an Emergency Use Authorization (EUA). This EUA will remain in effect (meaning this test can be used) for the duration of the COVID-19 declaration under Section 564(b)(1) of the Act, 21 U.S.C. section 360bbb-3(b)(1), unless the authorization is terminated or revoked.  Performed at [facility], [address] [place], [place] [address]      Time coordinating discharge: 35 minutes  SIGNED:   Erick Blinks, DO [facility] 09/08/2023, 9:55 AM  If 7PM-7AM, please contact night-coverage [email or web address]

## 2023-09-08 NOTE — Care Management Important Message (Signed)
Important Message  Patient Details  Name: Vicki Mcmillan MRN: [id number] Date of Birth: 1970-11-28   Important Message Given:  N/A - LOS <3 / Initial given by admissions     Corey Harold 09/08/2023, 10:56 AM

## 2023-09-10 ENCOUNTER — Telehealth (HOSPITAL_COMMUNITY): Payer: Medicare HMO | Admitting: Psychiatry

## 2023-09-10 ENCOUNTER — Encounter (HOSPITAL_COMMUNITY): Payer: Self-pay | Admitting: Psychiatry

## 2023-09-10 VITALS — Wt 125.0 lb

## 2023-09-10 DIAGNOSIS — F419 Anxiety disorder, unspecified: Secondary | ICD-10-CM | POA: Diagnosis not present

## 2023-09-10 DIAGNOSIS — F331 Major depressive disorder, recurrent, moderate: Secondary | ICD-10-CM | POA: Diagnosis not present

## 2023-09-10 MED ORDER — SERTRALINE HCL 100 MG PO TABS: 150.0000 mg | ORAL_TABLET | Freq: Every day | ORAL | 2 refills | Status: DC

## 2023-09-10 NOTE — Progress Notes (Signed)
[facility] MD Virtual Progress Note   Patient Location: Home Provider Location: Office  I connect with patient by video and verified that I am speaking with correct person by using two identifiers. I discussed the limitations of evaluation and management by telemedicine and the availability of in person appointments. I also discussed with the patient that there may be a patient responsible charge related to this service. The patient expressed understanding and agreed to proceed.  Vicki Mcmillan [id number] 52 y.o.  09/10/2023 3:39 PM  History of Present Illness:  Patient is evaluated by video session.  She recently discharged from the hospital due to exacerbation of COPD.  She had quit smoking and trying to recover from hospitalization.  She reported her anxiety and depression is okay but before she was very nervous because she could not breathe.  She is no longer taking Klonopin.  She also not taking nortriptyline because she believed it was causing weight gain.  She is now taking Topamax.  She denies any crying spells or any feeling of hopelessness or worthlessness.  She is on disability.  Her appetite is okay.  She denies any suicidal thoughts or any homicidal thoughts.  She denies any major panic attack.  She wants to continue the Zoloft 150 mg daily.  She is very close to her 69-year-old grandnephew who actually did face time with her when she was in the hospital.  Her plan is to spend time with a nephew on upcoming holidays.  Past Psychiatric History: No h/o suicidal attempt or inpatient treatment.  Saw psychiatrist after Chantix caused increased anxiety, hallucination and memory impairment.  Saw Dr. Marnette Burgess at [facility] and given Zoloft Abilify with good response.  PCP prescribed Xanax. Took Klonopin for a while.    Outpatient Encounter Medications as of 09/10/2023  Medication Sig   albuterol (VENTOLIN HFA) 108 (90 Base) MCG/ACT inhaler INHALE 1 TO 2 PUFFS INTO THE LUNGS EVERY 6  HOURS AS NEEDED FOR WHEEZING OR SHORTNESS OF BREATH   cyclobenzaprine (FLEXERIL) 5 MG tablet Take 5 mg by mouth at bedtime.   dextromethorphan-guaiFENesin (MUCINEX DM) 30-600 MG 12hr tablet Take 1 tablet by mouth 2 (two) times daily for 7 days.   doxycycline (VIBRA-TABS) 100 MG tablet Take 1 tablet (100 mg total) by mouth 2 (two) times daily for 3 days.   ibuprofen (ADVIL) 200 MG tablet Take 200 mg by mouth every 6 (six) hours as needed for headache.   ipratropium-albuterol (DUONEB) 0.5-2.5 (3) MG/3ML SOLN Take 3 mLs by nebulization 2 (two) times daily as needed (asthma).   loratadine (CLARITIN) 10 MG tablet Take 10 mg by mouth daily.   pantoprazole (PROTONIX) 40 MG tablet Take 1 tablet (40 mg total) by mouth daily. (Patient taking differently: Take 80 mg by mouth at bedtime.)   predniSONE (DELTASONE) 10 MG tablet Take 4 tablets (40 mg total) by mouth daily for 5 days.   prochlorperazine (COMPAZINE) 5 MG tablet Take 5 mg by mouth every 8 (eight) hours as needed for vomiting or nausea.   rizatriptan (MAXALT-MLT) 5 MG disintegrating tablet Take 5 mg by mouth as needed for migraine.   sertraline (ZOLOFT) 100 MG tablet Take 1.5 tablets (150 mg total) by mouth daily. (Patient taking differently: Take 150 mg by mouth at bedtime.)   tetrahydrozoline 0.05 % ophthalmic solution Place 2 drops into both eyes daily as needed (irritation).   topiramate (TOPAMAX) 50 MG tablet Take 50 mg by mouth at bedtime.   TRELEGY ELLIPTA 200-62.5-25 MCG/ACT AEPB  INHALE 1 PUFF EVERY DAY   No facility-administered encounter medications on file as of 09/10/2023.    Recent Results (from the past 2160 hour(s))  CBC with Differential     Status: Abnormal   Collection Time: 09/05/23  3:31 PM  Result Value Ref Range   WBC 4.2 4.0 - 10.5 K/uL   RBC 4.50 3.87 - 5.11 MIL/uL   Hemoglobin 13.5 12.0 - 15.0 g/dL   HCT 78.2 95.6 - 21.3 %   MCV 89.1 80.0 - 100.0 fL   MCH 30.0 26.0 - 34.0 pg   MCHC 33.7 30.0 - 36.0 g/dL   RDW 08.6  57.8 - 46.9 %   Platelets 146 (L) 150 - 400 K/uL   nRBC 0.0 0.0 - 0.2 %   Neutrophils Relative % 66 %   Neutro Abs 2.9 1.7 - 7.7 K/uL   Lymphocytes Relative 28 %   Lymphs Abs 1.2 0.7 - 4.0 K/uL   Monocytes Relative 3 %   Monocytes Absolute 0.1 0.1 - 1.0 K/uL   Eosinophils Relative 1 %   Eosinophils Absolute 0.1 0.0 - 0.5 K/uL   Basophils Relative 1 %   Basophils Absolute 0.0 0.0 - 0.1 K/uL   Immature Granulocytes 1 %   Abs Immature Granulocytes 0.02 0.00 - 0.07 K/uL    Comment: Performed at [facility], [address], [place], [place] [address]  Basic metabolic panel     Status: Abnormal   Collection Time: 09/05/23  3:31 PM  Result Value Ref Range   Sodium 137 135 - 145 mmol/L   Potassium 3.3 (L) 3.5 - 5.1 mmol/L   Chloride 109 98 - 111 mmol/L   CO2 18 (L) 22 - 32 mmol/L   Glucose, Bld 94 70 - 99 mg/dL    Comment: Glucose reference range applies only to samples taken after fasting for at least 8 hours.   BUN 22 (H) 6 - 20 mg/dL   Creatinine, Ser 8.41 0.44 - 1.00 mg/dL   Calcium 8.3 (L) 8.9 - 10.3 mg/dL   GFR, Estimated >32 >44 mL/min    Comment: (NOTE) Calculated using the CKD-EPI Creatinine Equation (2021)    Anion gap 10 5 - 15    Comment: Performed at [facility], [address], [place], [place] [address]  D-dimer, quantitative     Status: None   Collection Time: 09/05/23  3:31 PM  Result Value Ref Range   D-Dimer, Quant 0.34 0.00 - 0.50 ug/mL-FEU    Comment: (NOTE) At the manufacturer cut-off value of 0.5 g/mL FEU, this assay has a negative predictive value of 95-100%.This assay is intended for use in conjunction with a clinical pretest probability (PTP) assessment model to exclude pulmonary embolism (PE) and deep venous thrombosis (DVT) in outpatients suspected of PE or DVT. Results should be correlated with clinical presentation. Performed at [facility], [address] [place], [place] [address]   Troponin I (High Sensitivity)     Status: None   Collection  Time: 09/05/23  4:05 PM  Result Value Ref Range   Troponin I (High Sensitivity) 2 <18 ng/L    Comment: (NOTE) Elevated high sensitivity troponin I (hsTnI) values and significant  changes across serial measurements may suggest ACS but many other  chronic and acute conditions are known to elevate hsTnI results.  Refer to the "Links" section for chest pain algorithms and additional  guidance. Performed at [facility], [address] [place], [place] [address]   Resp panel by RT-PCR (RSV,  Flu A&B, Covid) Anterior Nasal Swab     Status: None   Collection Time: 09/05/23  4:17 PM   Specimen: Anterior Nasal Swab  Result Value Ref Range   SARS Coronavirus 2 by RT PCR NEGATIVE NEGATIVE    Comment: (NOTE) SARS-CoV-2 target nucleic acids are NOT DETECTED.  The SARS-CoV-2 RNA is generally detectable in upper respiratory specimens during the acute phase of infection. The lowest concentration of SARS-CoV-2 viral copies this assay can detect is 138 copies/mL. A negative result does not preclude SARS-Cov-2 infection and should not be used as the sole basis for treatment or other patient management decisions. A negative result may occur with  improper specimen collection/handling, submission of specimen other than nasopharyngeal swab, presence of viral mutation(s) within the areas targeted by this assay, and inadequate number of viral copies(<138 copies/mL). A negative result must be combined with clinical observations, patient history, and epidemiological information. The expected result is Negative.  Fact Sheet for Patients:  BloggerCourse.com  Fact Sheet for Healthcare Providers:  SeriousBroker.it  This test is no t yet approved or cleared by the [place] FDA and  has been authorized for detection and/or diagnosis of SARS-CoV-2 by FDA under an Emergency Use Authorization (EUA). This EUA will remain  in effect (meaning this test can be  used) for the duration of the COVID-19 declaration under Section 564(b)(1) of the Act, 21 U.S.C.section 360bbb-3(b)(1), unless the authorization is terminated  or revoked sooner.       Influenza A by PCR NEGATIVE NEGATIVE   Influenza B by PCR NEGATIVE NEGATIVE    Comment: (NOTE) The Xpert Xpress SARS-CoV-2/FLU/RSV plus assay is intended as an aid in the diagnosis of influenza from Nasopharyngeal swab specimens and should not be used as a sole basis for treatment. Nasal washings and aspirates are unacceptable for Xpert Xpress SARS-CoV-2/FLU/RSV testing.  Fact Sheet for Patients: BloggerCourse.com  Fact Sheet for Healthcare Providers: SeriousBroker.it  This test is not yet approved or cleared by the [place] FDA and has been authorized for detection and/or diagnosis of SARS-CoV-2 by FDA under an Emergency Use Authorization (EUA). This EUA will remain in effect (meaning this test can be used) for the duration of the COVID-19 declaration under Section 564(b)(1) of the Act, 21 U.S.C. section 360bbb-3(b)(1), unless the authorization is terminated or revoked.     Resp Syncytial Virus by PCR NEGATIVE NEGATIVE    Comment: (NOTE) Fact Sheet for Patients: BloggerCourse.com  Fact Sheet for Healthcare Providers: SeriousBroker.it  This test is not yet approved or cleared by the [place] FDA and has been authorized for detection and/or diagnosis of SARS-CoV-2 by FDA under an Emergency Use Authorization (EUA). This EUA will remain in effect (meaning this test can be used) for the duration of the COVID-19 declaration under Section 564(b)(1) of the Act, 21 U.S.C. section 360bbb-3(b)(1), unless the authorization is terminated or revoked.  Performed at [facility], [address] [place], [place] [address]   Troponin I (High Sensitivity)     Status: None   Collection Time:  09/05/23  6:06 PM  Result Value Ref Range   Troponin I (High Sensitivity) 2 <18 ng/L    Comment: (NOTE) Elevated high sensitivity troponin I (hsTnI) values and significant  changes across serial measurements may suggest ACS but many other  chronic and acute conditions are known to elevate hsTnI results.  Refer to the "Links" section for chest pain algorithms and additional  guidance. Performed at [facility], [address] [place],  [place] [address]   Procalcitonin     Status: None   Collection Time: 09/05/23  6:06 PM  Result Value Ref Range   Procalcitonin <0.10 ng/mL    Comment:        Interpretation: PCT (Procalcitonin) <= 0.5 ng/mL: Systemic infection (sepsis) is not likely. Local bacterial infection is possible. (NOTE)       Sepsis PCT Algorithm           Lower Respiratory Tract                                      Infection PCT Algorithm    ----------------------------     ----------------------------         PCT < 0.25 ng/mL                PCT < 0.10 ng/mL          Strongly encourage             Strongly discourage   discontinuation of antibiotics    initiation of antibiotics    ----------------------------     -----------------------------       PCT 0.25 - 0.50 ng/mL            PCT 0.10 - 0.25 ng/mL               OR       >80% decrease in PCT            Discourage initiation of                                            antibiotics      Encourage discontinuation           of antibiotics    ----------------------------     -----------------------------         PCT >= 0.50 ng/mL              PCT 0.26 - 0.50 ng/mL               AND        <80% decrease in PCT             Encourage initiation of                                             antibiotics       Encourage continuation           of antibiotics    ----------------------------     -----------------------------        PCT >= 0.50 ng/mL                  PCT > 0.50 ng/mL               AND         increase in  PCT                  Strongly encourage  initiation of antibiotics    Strongly encourage escalation           of antibiotics                                     -----------------------------                                           PCT <= 0.25 ng/mL                                                 OR                                        > 80% decrease in PCT                                      Discontinue / Do not initiate                                             antibiotics  Performed at [facility], [address] [place], [place] [address]   HIV Antibody (routine testing w rflx)     Status: None   Collection Time: 09/06/23  4:55 AM  Result Value Ref Range   HIV Screen 4th Generation wRfx Non Reactive Non Reactive    Comment: Performed at [facility], [address] [place], [place] [address]  CBC     Status: Abnormal   Collection Time: 09/06/23  4:55 AM  Result Value Ref Range   WBC 3.0 (L) 4.0 - 10.5 K/uL   RBC 4.14 3.87 - 5.11 MIL/uL   Hemoglobin 12.5 12.0 - 15.0 g/dL   HCT 09.8 11.9 - 14.7 %   MCV 90.8 80.0 - 100.0 fL   MCH 30.2 26.0 - 34.0 pg   MCHC 33.2 30.0 - 36.0 g/dL   RDW 82.9 56.2 - 13.0 %   Platelets 165 150 - 400 K/uL   nRBC 0.0 0.0 - 0.2 %    Comment: Performed at [facility], [address], [place], [place] [address]  Basic metabolic panel     Status: Abnormal   Collection Time: 09/07/23  4:43 AM  Result Value Ref Range   Sodium 140 135 - 145 mmol/L   Potassium 4.3 3.5 - 5.1 mmol/L   Chloride 110 98 - 111 mmol/L   CO2 25 22 - 32 mmol/L   Glucose, Bld 86 70 - 99 mg/dL    Comment: Glucose reference range applies only to samples taken after fasting for at least 8 hours.   BUN 31 (H) 6 - 20 mg/dL   Creatinine, Ser 4.69 0.44 - 1.00 mg/dL   Calcium 8.5 (L) 8.9 - 10.3 mg/dL   GFR, Estimated >62 >95 mL/min    Comment: (NOTE) Calculated using the CKD-EPI Creatinine Equation (2021)  Anion gap 5 5 - 15     Comment: Performed at [facility], [address], [place], [place] [address]  Magnesium     Status: None   Collection Time: 09/07/23  4:43 AM  Result Value Ref Range   Magnesium 2.0 1.7 - 2.4 mg/dL    Comment: Performed at [facility], [address], [place], [place] [address]  CBC     Status: None   Collection Time: 09/07/23  4:43 AM  Result Value Ref Range   WBC 5.7 4.0 - 10.5 K/uL   RBC 4.15 3.87 - 5.11 MIL/uL   Hemoglobin 12.2 12.0 - 15.0 g/dL   HCT 91.4 78.2 - 95.6 %   MCV 91.3 80.0 - 100.0 fL   MCH 29.4 26.0 - 34.0 pg   MCHC 32.2 30.0 - 36.0 g/dL   RDW 21.3 08.6 - 57.8 %   Platelets 175 150 - 400 K/uL   nRBC 0.0 0.0 - 0.2 %    Comment: Performed at [facility], [address], [place], [place] [address]     Psychiatric Specialty Exam: Physical Exam  Review of Systems  Weight 125 lb (56.7 kg).There is no height or weight on file to calculate BMI.  General Appearance: Casual  Eye Contact:  Good  Speech:  Normal Rate  Volume:  Normal  Mood:   tired  Affect:  Congruent  Thought Process:  Goal Directed  Orientation:  Full (Time, Place, and Person)  Thought Content:  Logical  Suicidal Thoughts:  No  Homicidal Thoughts:  No  Memory:  Immediate;   Good Recent;   Good Remote;   Good  Judgement:  Good  Insight:  Present  Psychomotor Activity:  Normal  Concentration:  Concentration: Good and Attention Span: Good  Recall:  Good  Fund of Knowledge:  Good  Language:  Good  Akathisia:  No  Handed:  Right  AIMS (if indicated):     Assets:  Communication Skills Desire for Improvement Housing Social Support Transportation  ADL's:  Intact  Cognition:  WNL  Sleep:  fair     Assessment/Plan: MDD (major depressive disorder), recurrent episode, moderate (HCC) - Plan: sertraline (ZOLOFT) 100 MG tablet  Anxiety - Plan: sertraline (ZOLOFT) 100 MG tablet  Patient was recently admitted because of exacerbation of COPD.  She is recovering.  She is taking Zoloft 150 mg  daily.  She is no longer taking nortriptyline for headaches and her provider switched to Topamax.  She also not taking Klonopin.  Discussed medication side effects and benefits.  Recommend to call us back if she has any question or any concern.  Follow-up in 3 months.   Follow Up Instructions:     I discussed the assessment and treatment plan with the patient. The patient was provided an opportunity to ask questions and all were answered. The patient agreed with the plan and demonstrated an understanding of the instructions.   The patient was advised to call back or seek an in-person evaluation if the symptoms worsen or if the condition fails to improve as anticipated.    Collaboration of Care: Other provider involved in patient's care AEB notes are available in epic to review  Patient/Guardian was advised Release of Information must be obtained prior to any record release in order to collaborate their care with an outside provider. Patient/Guardian was advised if they have not already done so to contact the registration department to sign all necessary forms in order for [place] to release information  regarding their care.   Consent: Patient/Guardian gives verbal consent for treatment and assignment of benefits for services provided during this visit. Patient/Guardian expressed understanding and agreed to proceed.     I provided 12 minutes of non face to face time during this encounter.  Note: This document was prepared by [employer] voice dictation technology and any errors that results from this process are unintentional.    Cleotis Nipper, MD 09/10/2023

## 2023-09-28 ENCOUNTER — Telehealth: Payer: Self-pay | Admitting: Primary Care

## 2023-09-28 NOTE — Telephone Encounter (Signed)
Pt calling in needing a refill for her albuterol   [id number] - [place], [place] - [address] [place] [address] [place] [address]

## 2023-09-29 MED ORDER — ALBUTEROL SULFATE HFA 108 (90 BASE) MCG/ACT IN AERS: INHALATION_SPRAY | RESPIRATORY_TRACT | 5 refills | Status: DC

## 2023-09-29 NOTE — Telephone Encounter (Signed)
Refill sent.

## 2023-11-04 ENCOUNTER — Inpatient Hospital Stay (HOSPITAL_BASED_OUTPATIENT_CLINIC_OR_DEPARTMENT_OTHER)
Admission: EM | Admit: 2023-11-04 | Discharge: 2023-11-09 | DRG: 192 | Disposition: A | Discharge status: Home / Self Care | Payer: HMO | Attending: Internal Medicine | Admitting: Internal Medicine

## 2023-11-04 ENCOUNTER — Other Ambulatory Visit: Payer: Self-pay

## 2023-11-04 ENCOUNTER — Emergency Department (HOSPITAL_BASED_OUTPATIENT_CLINIC_OR_DEPARTMENT_OTHER): Payer: HMO | Admitting: Radiology

## 2023-11-04 ENCOUNTER — Encounter (HOSPITAL_BASED_OUTPATIENT_CLINIC_OR_DEPARTMENT_OTHER): Payer: Self-pay | Admitting: Emergency Medicine

## 2023-11-04 DIAGNOSIS — Z8541 Personal history of malignant neoplasm of cervix uteri: Secondary | ICD-10-CM

## 2023-11-04 DIAGNOSIS — J441 Chronic obstructive pulmonary disease with (acute) exacerbation: Secondary | ICD-10-CM | POA: Diagnosis not present

## 2023-11-04 DIAGNOSIS — I252 Old myocardial infarction: Secondary | ICD-10-CM

## 2023-11-04 DIAGNOSIS — Z885 Allergy status to narcotic agent status: Secondary | ICD-10-CM

## 2023-11-04 DIAGNOSIS — Z7951 Long term (current) use of inhaled steroids: Secondary | ICD-10-CM

## 2023-11-04 DIAGNOSIS — Z79899 Other long term (current) drug therapy: Secondary | ICD-10-CM

## 2023-11-04 DIAGNOSIS — Z825 Family history of asthma and other chronic lower respiratory diseases: Secondary | ICD-10-CM

## 2023-11-04 DIAGNOSIS — Z888 Allergy status to other drugs, medicaments and biological substances status: Secondary | ICD-10-CM

## 2023-11-04 DIAGNOSIS — Z91018 Allergy to other foods: Secondary | ICD-10-CM

## 2023-11-04 DIAGNOSIS — F32A Depression, unspecified: Secondary | ICD-10-CM | POA: Diagnosis present

## 2023-11-04 DIAGNOSIS — J45909 Unspecified asthma, uncomplicated: Secondary | ICD-10-CM | POA: Diagnosis present

## 2023-11-04 DIAGNOSIS — R0902 Hypoxemia: Secondary | ICD-10-CM | POA: Diagnosis present

## 2023-11-04 DIAGNOSIS — Z85828 Personal history of other malignant neoplasm of skin: Secondary | ICD-10-CM

## 2023-11-04 DIAGNOSIS — Z1152 Encounter for screening for COVID-19: Secondary | ICD-10-CM

## 2023-11-04 DIAGNOSIS — J449 Chronic obstructive pulmonary disease, unspecified: Principal | ICD-10-CM

## 2023-11-04 DIAGNOSIS — K219 Gastro-esophageal reflux disease without esophagitis: Secondary | ICD-10-CM | POA: Diagnosis present

## 2023-11-04 DIAGNOSIS — Z87891 Personal history of nicotine dependence: Secondary | ICD-10-CM

## 2023-11-04 LAB — CBC WITH DIFFERENTIAL/PLATELET
Abs Immature Granulocytes: 0.01 10*3/uL (ref 0.00–0.07)
Basophils Absolute: 0 10*3/uL (ref 0.0–0.1)
Basophils Relative: 0 %
Eosinophils Absolute: 0 10*3/uL (ref 0.0–0.5)
Eosinophils Relative: 1 %
HCT: 36.7 % (ref 36.0–46.0)
Hemoglobin: 12.2 g/dL (ref 12.0–15.0)
Immature Granulocytes: 0 %
Lymphocytes Relative: 20 %
Lymphs Abs: 0.8 10*3/uL (ref 0.7–4.0)
MCH: 30.4 pg (ref 26.0–34.0)
MCHC: 33.2 g/dL (ref 30.0–36.0)
MCV: 91.5 fL (ref 80.0–100.0)
Monocytes Absolute: 0.3 10*3/uL (ref 0.1–1.0)
Monocytes Relative: 7 %
Neutro Abs: 2.9 10*3/uL (ref 1.7–7.7)
Neutrophils Relative %: 72 %
Platelets: 174 10*3/uL (ref 150–400)
RBC: 4.01 MIL/uL (ref 3.87–5.11)
RDW: 14.7 % (ref 11.5–15.5)
WBC: 4 10*3/uL (ref 4.0–10.5)
nRBC: 0 % (ref 0.0–0.2)

## 2023-11-04 LAB — RESP PANEL BY RT-PCR (RSV, FLU A&B, COVID)  RVPGX2
Influenza A by PCR: NEGATIVE
Influenza B by PCR: NEGATIVE
Resp Syncytial Virus by PCR: NEGATIVE
SARS Coronavirus 2 by RT PCR: NEGATIVE

## 2023-11-04 LAB — BASIC METABOLIC PANEL
Anion gap: 10 (ref 5–15)
BUN: 14 mg/dL (ref 6–20)
CO2: 23 mmol/L (ref 22–32)
Calcium: 8.9 mg/dL (ref 8.9–10.3)
Chloride: 108 mmol/L (ref 98–111)
Creatinine, Ser: 0.79 mg/dL (ref 0.44–1.00)
GFR, Estimated: >60 mL/min (ref >60)
Glucose, Bld: 98 mg/dL (ref 70–99)
Potassium: 3.9 mmol/L (ref 3.5–5.1)
Sodium: 141 mmol/L (ref 135–145)

## 2023-11-04 MED ORDER — ALBUTEROL SULFATE (2.5 MG/3ML) 0.083% IN NEBU
5.0000 mg | INHALATION_SOLUTION | Freq: Once | RESPIRATORY_TRACT | Status: AC
Start: 2023-11-04 — End: 2023-11-05
  Administered 2023-11-05: 5 mg via RESPIRATORY_TRACT
  Filled 2023-11-04: qty 6

## 2023-11-04 MED ORDER — METHYLPREDNISOLONE SODIUM SUCC 125 MG IJ SOLR
125.0000 mg | Freq: Once | INTRAMUSCULAR | Status: AC
  Administered 2023-11-04: 125 mg via INTRAVENOUS
  Filled 2023-11-04: qty 2

## 2023-11-04 MED ORDER — ALBUTEROL SULFATE (2.5 MG/3ML) 0.083% IN NEBU
10.0000 mg | INHALATION_SOLUTION | Freq: Once | RESPIRATORY_TRACT | Status: AC
  Administered 2023-11-04: 10 mg via RESPIRATORY_TRACT
  Filled 2023-11-04: qty 12

## 2023-11-04 MED ORDER — IPRATROPIUM-ALBUTEROL 0.5-2.5 (3) MG/3ML IN SOLN
3.0000 mL | Freq: Once | RESPIRATORY_TRACT | Status: AC
  Administered 2023-11-04: 3 mL via RESPIRATORY_TRACT
  Filled 2023-11-04: qty 3

## 2023-11-04 MED ORDER — MAGNESIUM SULFATE 50 % IJ SOLN
1.0000 g | Freq: Once | INTRAMUSCULAR | Status: AC
  Administered 2023-11-04: 1 g via INTRAVENOUS
  Filled 2023-11-04: qty 2

## 2023-11-04 MED ORDER — ACETAMINOPHEN 500 MG PO TABS
1000.0000 mg | ORAL_TABLET | Freq: Once | ORAL | Status: AC
  Administered 2023-11-04: 1000 mg via ORAL
  Filled 2023-11-04: qty 2

## 2023-11-04 MED ORDER — ALBUTEROL SULFATE HFA 108 (90 BASE) MCG/ACT IN AERS: 2.0000 | INHALATION_SPRAY | RESPIRATORY_TRACT | Status: DC | PRN

## 2023-11-04 NOTE — ED Provider Notes (Signed)
 [facility]ERGENCY DEPARTMENT AT [facility] Provider Note   CSN: [id number] Arrival date & time: 11/04/23  1756     History  Chief Complaint  Patient presents with   Shortness of Breath    Vicki Mcmillan is a 53 y.o. female.  The history is provided by the patient and medical records. No language interpreter was used.  Shortness of Breath    53 year old female with significant history of COPD, anxiety, tobacco use, asthma presenting with complaints of shortness of breath.  Patient report having itchy eyes, sneezing, coughing, wheezing that started earlier today.  She tries using her home nebulizer several times as well as multiple times of her rescue inhaler but noticed no improvement.  She denies any environmental changes or anything that triggers her symptoms.  She mention she was hospitalized for similar COPD exacerbation several weeks prior and does not want to get to that states.  She denies any prior history of PE or DVT denies any productive cough or hemoptysis.    Home Medications Prior to Admission medications   Medication Sig Start Date End Date Taking? Authorizing Provider  albuterol  (VENTOLIN  HFA) 108 (90 Base) MCG/ACT inhaler INHALE 1 TO 2 PUFFS INTO THE LUNGS EVERY 6 HOURS AS NEEDED FOR WHEEZING OR SHORTNESS OF BREATH 09/29/23   Icard, Adine CROME, DO  cyclobenzaprine (FLEXERIL) 5 MG tablet Take 5 mg by mouth at bedtime. 12/11/20   [provider]  ibuprofen  (ADVIL ) 200 MG tablet Take 200 mg by mouth every 6 (six) hours as needed for headache.    [provider]  ipratropium-albuterol  (DUONEB) 0.5-2.5 (3) MG/3ML SOLN Take 3 mLs by nebulization 2 (two) times daily as needed (asthma). 09/08/23 10/08/23  Maree, Pratik D, DO  loratadine  (CLARITIN ) 10 MG tablet Take 10 mg by mouth daily.    [provider]  pantoprazole  (PROTONIX ) 40 MG tablet Take 1 tablet (40 mg total) by mouth daily. Patient taking differently: Take 80 mg by mouth at  bedtime. 09/02/23   Cirigliano, Vito V, DO  prochlorperazine (COMPAZINE) 5 MG tablet Take 5 mg by mouth every 8 (eight) hours as needed for vomiting or nausea.    [provider]  rizatriptan  (MAXALT -MLT) 5 MG disintegrating tablet Take 5 mg by mouth as needed for migraine. 04/17/23   Arvil Fonda CROME, NP  sertraline  (ZOLOFT ) 100 MG tablet Take 1.5 tablets (150 mg total) by mouth daily. 09/10/23   Arfeen, Leni DASEN, MD  tetrahydrozoline 0.05 % ophthalmic solution Place 2 drops into both eyes daily as needed (irritation).    [provider]  topiramate  (TOPAMAX ) 50 MG tablet Take 50 mg by mouth at bedtime.    [provider]  TRELEGY ELLIPTA  200-62.5-25 MCG/ACT AEPB INHALE 1 PUFF EVERY DAY 04/20/23   Icard, Adine L, DO      Allergies    Hydrocodone-acetaminophen , Tomato, Bupropion, Oxycodone, and Varenicline    Review of Systems   Review of Systems  Respiratory:  Positive for shortness of breath.   All other systems reviewed and are negative.   Physical Exam Updated Vital Signs BP 113/88 (BP Location: Right Arm)   Pulse 96   Temp 99.3 F (37.4 C) (Oral)   Resp 20   Wt 56.7 kg   SpO2 93%   BMI 23.62 kg/m  Physical Exam Vitals and nursing note reviewed.  Constitutional:      General: She is not in acute distress.    Appearance: She is well-developed.  HENT:  Head: Atraumatic.     Nose: Nose normal.     Mouth/Throat:     Mouth: Mucous membranes are moist.  Eyes:     Conjunctiva/sclera: Conjunctivae normal.  Cardiovascular:     Rate and Rhythm: Tachycardia present.  Pulmonary:     Effort: Pulmonary effort is normal.     Breath sounds: Wheezing present. No rhonchi or rales.  Chest:     Chest wall: No tenderness.  Abdominal:     Palpations: Abdomen is soft.  Musculoskeletal:     Cervical back: Normal range of motion and neck supple. No rigidity.     Right lower leg: No edema.     Left lower leg: No edema.  Skin:    Findings: No rash.   Neurological:     Mental Status: She is alert and oriented to person, place, and time.  Psychiatric:        Mood and Affect: Mood normal.     ED Results / Procedures / Treatments   Labs (all labs ordered are listed, but only abnormal results are displayed) Labs Reviewed  RESP PANEL BY RT-PCR (RSV, FLU A&B, COVID)  [id number]  BASIC METABOLIC PANEL  CBC WITH DIFFERENTIAL/PLATELET  I-STAT VENOUS BLOOD GAS, ED    EKG None  Radiology DG Chest Port 1 View Result Date: 11/04/2023 CLINICAL DATA:  141880 SOB (shortness of breath) 141880 EXAM: PORTABLE CHEST - 1 VIEW COMPARISON:  09/05/2023. FINDINGS: Cardiac silhouette is unremarkable. No pneumothorax or pleural effusion. The lungs are clear. The visualized skeletal structures are unremarkable. IMPRESSION: No acute cardiopulmonary process. Electronically Signed   By: Fonda Field M.D.   On: 11/04/2023 19:38    Procedures .Critical Care  Performed by: Nivia Colon, PA-C Authorized by: Nivia Colon, PA-C   Critical care provider statement:    Critical care time (minutes):  35   Critical care was time spent personally by me on the following activities:  Development of treatment plan with patient or surrogate, discussions with consultants, evaluation of patient's response to treatment, examination of patient, ordering and review of laboratory studies, ordering and review of radiographic studies, ordering and performing treatments and interventions, pulse oximetry, re-evaluation of patient's condition and review of old charts     Medications Ordered in ED Medications  albuterol  (VENTOLIN  HFA) 108 (90 Base) MCG/ACT inhaler 2 puff (has no administration in time range)  albuterol  (PROVENTIL ) (2.5 MG/3ML) 0.083% nebulizer solution 5 mg (has no administration in time range)  albuterol  (PROVENTIL ) (2.5 MG/3ML) 0.083% nebulizer solution 10 mg (10 mg Nebulization Given 11/04/23 1822)  methylPREDNISolone  sodium succinate (SOLU-MEDROL ) 125 mg/2 mL  injection 125 mg (125 mg Intravenous Given 11/04/23 1953)  magnesium  sulfate (IV Push/IM) injection 1 g (1 g Intravenous Given 11/04/23 1957)  ipratropium-albuterol  (DUONEB) 0.5-2.5 (3) MG/3ML nebulizer solution 3 mL (3 mLs Nebulization Given 11/04/23 2010)  acetaminophen  (TYLENOL ) tablet 1,000 mg (1,000 mg Oral Given 11/04/23 2033)    ED Course/ Medical Decision Making/ A&P                                 Medical Decision Making Amount and/or Complexity of Data Reviewed Labs: ordered. Radiology: ordered.  Risk OTC drugs. Prescription drug management. Decision regarding hospitalization.   BP 113/88 (BP Location: Right Arm)   Pulse 96   Temp 99.3 F (37.4 C) (Oral)   Resp 20   Wt 56.7 kg   SpO2 93%   BMI 23.62  kg/m   36:22 PM  53 year old female with significant history of COPD, anxiety, tobacco use, asthma presenting with complaints of shortness of breath.  Patient report having itchy eyes, sneezing, coughing, wheezing that started earlier today.  She tries using her home nebulizer several times as well as multiple times of her rescue inhaler but noticed no improvement.  She denies any environmental changes or anything that triggers her symptoms.  She mention she was hospitalized for similar COPD exacerbation several weeks prior and does not want to get to that states.  She denies any prior history of PE or DVT denies any productive cough or hemoptysis.    Exam notable for patient who appears in moderate respiratory discomfort while on continuous nebulizing.  Her lungs are tight with expiratory and inspiratory wheezes.  No peripheral edema.  -Labs ordered, independently viewed and interpreted by me.  Labs remarkable for normal WBC, normal H&H, electrolytes panels are reassuring.  Negative covid/flu/rsv -The patient was maintained on a cardiac monitor.  I personally viewed and interpreted the cardiac monitored which showed an underlying rhythm of: sinus tachycardia -Imaging independently  viewed and interpreted by me and I agree with radiologist's interpretation.  Result remarkable for CXR no acute process -This patient presents to the ED for concern of sob, this involves an extensive number of treatment options, and is a complaint that carries with it a high risk of complications and morbidity.  The differential diagnosis includes copd exacerbation, asthma exacerbation, viral illness, pneumonia, pe, anemia -Co morbidities that complicate the patient evaluation includes copd, anxiety, asthma -Treatment includes duonebs, continuous nebs, solumedrol, mag, supplemental O2 -Reevaluation of the patient after these medicines showed that the patient stayed the same -PCP office notes or outside notes reviewed -Discussion with specialist [facility] Dr. Franky who agrees to admit for COPD exacerbation.  He request a vbg.  -Escalation to admission/observation considered: patient agrees with admission.        Final Clinical Impression(s) / ED Diagnoses Final diagnoses:  COPD with hypoxia [facility])    Rx / DC Orders ED Discharge Orders     None         Nivia Colon, PA-C 11/05/23 0021    Geraldene Hamilton, MD 11/06/23 [phone]

## 2023-11-04 NOTE — ED Triage Notes (Signed)
 Itchy watery eyes, coughing/sneezing short of breath  started today. With activity . Pt has asthma. Took breathing tx this am.

## 2023-11-05 ENCOUNTER — Other Ambulatory Visit: Payer: Self-pay | Admitting: Gastroenterology

## 2023-11-05 DIAGNOSIS — Z79899 Other long term (current) drug therapy: Secondary | ICD-10-CM | POA: Diagnosis not present

## 2023-11-05 DIAGNOSIS — Z7951 Long term (current) use of inhaled steroids: Secondary | ICD-10-CM | POA: Diagnosis not present

## 2023-11-05 DIAGNOSIS — Z825 Family history of asthma and other chronic lower respiratory diseases: Secondary | ICD-10-CM | POA: Diagnosis not present

## 2023-11-05 DIAGNOSIS — K219 Gastro-esophageal reflux disease without esophagitis: Secondary | ICD-10-CM | POA: Diagnosis not present

## 2023-11-05 DIAGNOSIS — I252 Old myocardial infarction: Secondary | ICD-10-CM | POA: Diagnosis not present

## 2023-11-05 DIAGNOSIS — Z85828 Personal history of other malignant neoplasm of skin: Secondary | ICD-10-CM | POA: Diagnosis not present

## 2023-11-05 DIAGNOSIS — Z91018 Allergy to other foods: Secondary | ICD-10-CM | POA: Diagnosis not present

## 2023-11-05 DIAGNOSIS — J441 Chronic obstructive pulmonary disease with (acute) exacerbation: Secondary | ICD-10-CM | POA: Diagnosis present

## 2023-11-05 DIAGNOSIS — F32A Depression, unspecified: Secondary | ICD-10-CM | POA: Diagnosis not present

## 2023-11-05 DIAGNOSIS — Z888 Allergy status to other drugs, medicaments and biological substances status: Secondary | ICD-10-CM | POA: Diagnosis not present

## 2023-11-05 DIAGNOSIS — R0902 Hypoxemia: Secondary | ICD-10-CM | POA: Diagnosis not present

## 2023-11-05 DIAGNOSIS — Z8541 Personal history of malignant neoplasm of cervix uteri: Secondary | ICD-10-CM | POA: Diagnosis not present

## 2023-11-05 DIAGNOSIS — Z87891 Personal history of nicotine dependence: Secondary | ICD-10-CM | POA: Diagnosis not present

## 2023-11-05 DIAGNOSIS — Z885 Allergy status to narcotic agent status: Secondary | ICD-10-CM | POA: Diagnosis not present

## 2023-11-05 DIAGNOSIS — Z1152 Encounter for screening for COVID-19: Secondary | ICD-10-CM | POA: Diagnosis not present

## 2023-11-05 LAB — I-STAT VENOUS BLOOD GAS, ED
Acid-base deficit: 5 mmol/L — ABNORMAL HIGH (ref 0.0–2.0)
Bicarbonate: 20.1 mmol/L (ref 20.0–28.0)
Calcium, Ion: 1.27 mmol/L (ref 1.15–1.40)
HCT: 37 % (ref 36.0–46.0)
Hemoglobin: 12.6 g/dL (ref 12.0–15.0)
O2 Saturation: 78 %
Potassium: 3.9 mmol/L (ref 3.5–5.1)
Sodium: 139 mmol/L (ref 135–145)
TCO2: 21 mmol/L — ABNORMAL LOW (ref 22–32)
pCO2, Ven: 38.7 mm[Hg] — ABNORMAL LOW (ref 44–60)
pH, Ven: 7.324 (ref 7.25–7.43)
pO2, Ven: 46 mm[Hg] — ABNORMAL HIGH (ref 32–45)

## 2023-11-05 MED ORDER — FLUTICASONE FUROATE-VILANTEROL 200-25 MCG/ACT IN AEPB
1.0000 | INHALATION_SPRAY | Freq: Every day | RESPIRATORY_TRACT | Status: DC
  Filled 2023-11-05: qty 28

## 2023-11-05 MED ORDER — IPRATROPIUM-ALBUTEROL 0.5-2.5 (3) MG/3ML IN SOLN
3.0000 mL | Freq: Two times a day (BID) | RESPIRATORY_TRACT | Status: DC
  Administered 2023-11-05: 3 mL via RESPIRATORY_TRACT
  Filled 2023-11-05: qty 3

## 2023-11-05 MED ORDER — SUMATRIPTAN SUCCINATE 50 MG PO TABS: 50.0000 mg | ORAL_TABLET | ORAL | Status: DC | PRN

## 2023-11-05 MED ORDER — UMECLIDINIUM BROMIDE 62.5 MCG/ACT IN AEPB
1.0000 | INHALATION_SPRAY | Freq: Every day | RESPIRATORY_TRACT | Status: DC
  Filled 2023-11-05: qty 7

## 2023-11-05 MED ORDER — SERTRALINE HCL 50 MG PO TABS
150.0000 mg | ORAL_TABLET | Freq: Every day | ORAL | Status: DC
  Administered 2023-11-05 – 2023-11-09 (×5): 150 mg via ORAL
  Filled 2023-11-05 (×5): qty 1

## 2023-11-05 MED ORDER — ENOXAPARIN SODIUM 40 MG/0.4ML IJ SOSY
40.0000 mg | PREFILLED_SYRINGE | INTRAMUSCULAR | Status: DC
  Administered 2023-11-05 – 2023-11-08 (×4): 40 mg via SUBCUTANEOUS
  Filled 2023-11-05 (×4): qty 0.4

## 2023-11-05 MED ORDER — ONDANSETRON HCL 4 MG PO TABS: 4.0000 mg | ORAL_TABLET | Freq: Four times a day (QID) | ORAL | Status: DC | PRN

## 2023-11-05 MED ORDER — UMECLIDINIUM BROMIDE 62.5 MCG/ACT IN AEPB
1.0000 | INHALATION_SPRAY | Freq: Every day | RESPIRATORY_TRACT | Status: DC
  Administered 2023-11-06 – 2023-11-09 (×4): 1 via RESPIRATORY_TRACT
  Filled 2023-11-05: qty 7

## 2023-11-05 MED ORDER — ALBUTEROL SULFATE (2.5 MG/3ML) 0.083% IN NEBU
2.5000 mg | INHALATION_SOLUTION | RESPIRATORY_TRACT | Status: DC | PRN
  Administered 2023-11-05 – 2023-11-06 (×2): 2.5 mg via RESPIRATORY_TRACT
  Filled 2023-11-05 (×4): qty 3

## 2023-11-05 MED ORDER — IPRATROPIUM-ALBUTEROL 0.5-2.5 (3) MG/3ML IN SOLN: 3.0000 mL | Freq: Two times a day (BID) | RESPIRATORY_TRACT | Status: DC

## 2023-11-05 MED ORDER — IBUPROFEN 200 MG PO TABS: 400.0000 mg | ORAL_TABLET | Freq: Four times a day (QID) | ORAL | Status: DC | PRN

## 2023-11-05 MED ORDER — ALBUTEROL SULFATE (2.5 MG/3ML) 0.083% IN NEBU: 2.5000 mg | INHALATION_SOLUTION | RESPIRATORY_TRACT | Status: DC | PRN

## 2023-11-05 MED ORDER — METHYLPREDNISOLONE SODIUM SUCC 40 MG IJ SOLR
40.0000 mg | Freq: Two times a day (BID) | INTRAMUSCULAR | Status: DC
  Administered 2023-11-05 – 2023-11-08 (×6): 40 mg via INTRAVENOUS
  Filled 2023-11-05 (×6): qty 1

## 2023-11-05 MED ORDER — TOPIRAMATE 25 MG PO TABS
50.0000 mg | ORAL_TABLET | Freq: Every day | ORAL | Status: DC
  Administered 2023-11-05 – 2023-11-08 (×4): 50 mg via ORAL
  Filled 2023-11-05 (×4): qty 2

## 2023-11-05 MED ORDER — FLUTICASONE FUROATE-VILANTEROL 200-25 MCG/ACT IN AEPB
1.0000 | INHALATION_SPRAY | Freq: Every day | RESPIRATORY_TRACT | Status: DC
  Administered 2023-11-06 – 2023-11-09 (×4): 1 via RESPIRATORY_TRACT
  Filled 2023-11-05: qty 28

## 2023-11-05 MED ORDER — LORATADINE 10 MG PO TABS
10.0000 mg | ORAL_TABLET | Freq: Every day | ORAL | Status: DC
  Administered 2023-11-05 – 2023-11-09 (×5): 10 mg via ORAL
  Filled 2023-11-05 (×5): qty 1

## 2023-11-05 MED ORDER — RIZATRIPTAN BENZOATE 5 MG PO TBDP: 5.0000 mg | ORAL_TABLET | ORAL | Status: DC | PRN

## 2023-11-05 MED ORDER — TRAZODONE HCL 50 MG PO TABS: 25.0000 mg | ORAL_TABLET | Freq: Every evening | ORAL | Status: DC | PRN

## 2023-11-05 MED ORDER — ALBUTEROL SULFATE (2.5 MG/3ML) 0.083% IN NEBU
2.5000 mg | INHALATION_SOLUTION | Freq: Four times a day (QID) | RESPIRATORY_TRACT | Status: DC | PRN
  Administered 2023-11-05: 2.5 mg via RESPIRATORY_TRACT
  Filled 2023-11-05: qty 3

## 2023-11-05 MED ORDER — PANTOPRAZOLE SODIUM 40 MG PO TBEC
80.0000 mg | DELAYED_RELEASE_TABLET | Freq: Every day | ORAL | Status: DC
  Administered 2023-11-05 – 2023-11-08 (×4): 80 mg via ORAL
  Filled 2023-11-05 (×4): qty 2

## 2023-11-05 MED ORDER — ONDANSETRON HCL 4 MG/2ML IJ SOLN: 4.0000 mg | Freq: Four times a day (QID) | INTRAMUSCULAR | Status: DC | PRN

## 2023-11-05 MED ORDER — IPRATROPIUM-ALBUTEROL 0.5-2.5 (3) MG/3ML IN SOLN: 3.0000 mL | Freq: Four times a day (QID) | RESPIRATORY_TRACT | Status: DC

## 2023-11-05 NOTE — Progress Notes (Signed)
 Plan of Care Note for accepted transfer   Patient: Vicki Mcmillan MRN: [id number]   DOA: 11/04/2023  Facility requesting transfer: [facility]. Requesting Provider: Nivia Colon.  PA. Reason for transfer: Shortness of breath. Facility course: 53 year old female with known history of COPD admitted in November last year for COPD exacerbation presents with itchy eyes and shortness of breath.  ER physician states that patient on exam was having diffuse wheezing consistent with COPD exacerbation.  COVID and flu test were negative.  Patient started on nebulizer IV steroids COPD.  VBG is pending.  Plan of care: The patient is accepted for admission to Telemetry unit, at [facility]..   Author: Redia LOISE Cleaver, MD 11/05/2023  Check [email or web address] for on-call coverage.  Nursing staff, Please call [facility] Admits & Consults System-Wide number on Amion as soon as patient's arrival, so appropriate admitting provider can evaluate the pt.

## 2023-11-05 NOTE — ED Notes (Signed)
 Pt requested PRN tx for transport w/[facility]. RN Joni Reining will administer in route to [facility].

## 2023-11-05 NOTE — Plan of Care (Signed)

## 2023-11-05 NOTE — H&P (Signed)
 History and Physical  Vicki Mcmillan FMW:[id number] DOB: December 07, 1970 DOA: 11/04/2023  PCP: Teresa Aldona CROME, NP   Chief Complaint: Cough, dyspnea  HPI: Vicki Mcmillan is a 53 y.o. female with medical history significant for prior tobacco abuse, GERD, COPD on room air, depression being admitted to the hospital with acute exacerbation of COPD.  She was recently hospitalized in early November for acute COPD exacerbation, improved and discharged home on room air.  Since that time she has been doing well, but tells me that 2 days ago, she started having a recurrent cough productive of clear sputum.  No fever, no chest pain, no significant dyspnea at that time.  However, starting yesterday morning she started to develop increasing dyspnea with any kind of exertion, and increased cough.  She decided to come to the ER for evaluation, actually upon arrival to the ER last evening, she felt that her dyspnea got worse, she started having significant wheezing.  Noted by ER physicians to have diffuse wheezing, COVID and flu test were negative.  She was started on nebulizer, IV steroids.  She feels a little better currently, resting comfortably on 2 L nasal cannula oxygen .  Also states that after getting to the ER last evening, she started to cough up more discolored sputum.  Review of Systems: Please see HPI for pertinent positives and negatives. A complete 10 system review of systems are otherwise negative.  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; cervical cancer   Complication of anesthesia    COPD (chronic obstructive pulmonary disease) (HCC)    Depression    GERD (gastroesophageal reflux disease)    Headache    Myocardial infarction (HCC)    Panic attacks    PONV (postoperative nausea and vomiting)    Past Surgical History:  Procedure Laterality Date   BRONCHIAL BIOPSY  03/25/2022   Procedure: BRONCHIAL BIOPSIES;  Surgeon: Brenna Adine CROME, DO;  Location: [facility];  Service: Pulmonary;;   BRONCHIAL BRUSHINGS  03/25/2022   Procedure: BRONCHIAL BRUSHINGS;  Surgeon: Brenna Adine CROME, DO;  Location: [facility];  Service: Pulmonary;;   BRONCHIAL NEEDLE ASPIRATION BIOPSY  03/25/2022   Procedure: BRONCHIAL NEEDLE ASPIRATION BIOPSIES;  Surgeon: Brenna Adine CROME, DO;  Location: [facility];  Service: Pulmonary;;   COLONOSCOPY WITH ESOPHAGOGASTRODUODENOSCOPY (EGD)  08/11/2022   TUBAL LIGATION     VIDEO BRONCHOSCOPY WITH RADIAL ENDOBRONCHIAL ULTRASOUND  03/25/2022   Procedure: RADIAL ENDOBRONCHIAL ULTRASOUND;  Surgeon: Brenna Adine CROME, DO;  Location: [facility];  Service: Pulmonary;;   WRIST SURGERY     Social History:  reports that she quit smoking about 16 months ago. Her smoking use included cigarettes. She started smoking about 16 years ago. She has a 3.8 pack-year smoking history. She has never used smokeless tobacco. She reports that she does not drink alcohol and does not use drugs.  Allergies  Allergen Reactions   Hydrocodone-Acetaminophen  Nausea And Vomiting   Tomato Rash   Bupropion Other (See Comments)    hallucinations   Oxycodone Nausea And Vomiting and Other (See Comments)    Stomach upset   Varenicline Other (See Comments)    hallucination    Family History  Problem Relation Age of Onset   COPD Father    COPD Sister        Died in her 48s from it.   COPD Maternal Aunt    Colon cancer Neg  Hx    Rectal cancer Neg Hx    Stomach cancer Neg Hx    Esophageal cancer Neg Hx      Prior to Admission medications   Medication Sig Start Date End Date Taking? Authorizing Provider  albuterol  (VENTOLIN  HFA) 108 (90 Base) MCG/ACT inhaler INHALE 1 TO 2 PUFFS INTO THE LUNGS EVERY 6 HOURS AS NEEDED FOR WHEEZING OR SHORTNESS OF BREATH 09/29/23  Yes Icard, Bradley L, DO  ciclopirox (PENLAC) 8 % solution Apply topically See admin instructions. Apply topically at bedtime. Apply daily on nail and surrounding skin  over previous coat. After 7 days remove with alcohol and repeat cycle. 10/13/23 01/11/24 Yes [provider]  ibuprofen  (ADVIL ) 200 MG tablet Take 200 mg by mouth every 6 (six) hours as needed for headache.   Yes [provider]  ipratropium-albuterol  (DUONEB) 0.5-2.5 (3) MG/3ML SOLN Take 3 mLs by nebulization 2 (two) times daily as needed (asthma). 09/08/23 11/05/23 Yes Shah, Pratik D, DO  loratadine  (CLARITIN ) 10 MG tablet Take 10 mg by mouth daily.   Yes [provider]  pantoprazole  (PROTONIX ) 40 MG tablet Take 1 tablet (40 mg total) by mouth daily. Patient taking differently: Take 80 mg by mouth at bedtime. 09/02/23  Yes Cirigliano, Vito V, DO  prochlorperazine (COMPAZINE) 5 MG tablet Take 5 mg by mouth every 8 (eight) hours as needed for vomiting or nausea.   Yes [provider]  rizatriptan  (MAXALT -MLT) 5 MG disintegrating tablet Take 5 mg by mouth as needed for migraine. 04/17/23  Yes Arvil Fonda CROME, NP  sertraline  (ZOLOFT ) 100 MG tablet Take 1.5 tablets (150 mg total) by mouth daily. 09/10/23  Yes Arfeen, Leni DASEN, MD  topiramate  (TOPAMAX ) 50 MG tablet Take 50 mg by mouth at bedtime.   Yes [provider]  TRELEGY ELLIPTA  200-62.5-25 MCG/ACT AEPB INHALE 1 PUFF EVERY DAY 04/20/23  Yes Icard, Adine CROME, DO    Physical Exam: BP 115/77 (BP Location: Left Arm)   Pulse 72   Temp 98.2 F (36.8 C) (Oral)   Resp (!) 22   Ht 5' 1 (1.549 m)   Wt 56.4 kg   SpO2 97%   BMI 23.49 kg/m  General:  Alert, oriented, calm, in no acute distress, pleasant and cooperative, no cough and speaking in full sentences.  Wearing 2 L nasal cannula oxygen  Eyes: EOMI, clear conjuctivae, white sclerea Neck: supple, no masses, trachea mildline  Cardiovascular: RRR, no murmurs or rubs, no peripheral edema  Respiratory: No respiratory distress, diminished bilateral breath sounds, especially on the left.  She has some rhonchi on the right side, but no active wheezing Abdomen:  soft, nontender, nondistended, normal bowel tones heard  Skin: dry, no rashes  Musculoskeletal: no joint effusions, normal range of motion  Psychiatric: appropriate affect, normal speech  Neurologic: extraocular muscles intact, clear speech, moving all extremities with intact sensorium         Labs on Admission:  Basic Metabolic Panel: Recent Labs  Lab 11/04/23 1851 11/05/23 0044  NA 141 139  K 3.9 3.9  CL 108  --   CO2 23  --   GLUCOSE 98  --   BUN 14  --   CREATININE 0.79  --   CALCIUM 8.9  --    Liver Function Tests: No results for input(s): AST, ALT, ALKPHOS, BILITOT, PROT, ALBUMIN in the last 168 hours. No results for input(s): LIPASE, AMYLASE in the last 168 hours. No results for input(s): AMMONIA in the last  168 hours. CBC: Recent Labs  Lab 11/04/23 1851 11/05/23 0044  WBC 4.0  --   NEUTROABS 2.9  --   HGB 12.2 12.6  HCT 36.7 37.0  MCV 91.5  --   PLT 174  --    Cardiac Enzymes: No results for input(s): CKTOTAL, CKMB, CKMBINDEX, TROPONINI in the last 168 hours. BNP (last 3 results) No results for input(s): BNP in the last 8760 hours.  ProBNP (last 3 results) No results for input(s): PROBNP in the last 8760 hours.  CBG: No results for input(s): GLUCAP in the last 168 hours.  Radiological Exams on Admission: DG Chest Port 1 View Result Date: 11/04/2023 CLINICAL DATA:  141880 SOB (shortness of breath) 141880 EXAM: PORTABLE CHEST - 1 VIEW COMPARISON:  09/05/2023. FINDINGS: Cardiac silhouette is unremarkable. No pneumothorax or pleural effusion. The lungs are clear. The visualized skeletal structures are unremarkable. IMPRESSION: No acute cardiopulmonary process. Electronically Signed   By: Fonda Field M.D.   On: 11/04/2023 19:38   Assessment/Plan Vicki Mcmillan is a 53 y.o. female with medical history significant for prior tobacco abuse, GERD, COPD on room air, depression being admitted to the hospital with acute  exacerbation of COPD.    Acute exacerbation of COPD-with cough, dyspnea, diffuse wheezing.  Hypoxia was not documented, but patient has been placed on 2 L nasal cannula oxygen . -Observation admission -Supplemental oxygen , wean as tolerated goal O2 saturation greater than 90% -Scheduled Breo and Incruse -Albuterol  neb as needed for cough or shortness of breath -IV Solu-Medrol  40 mg twice daily -Incentive spirometer and flutter valve  GERD-p.o. PPI  Mood disorder-Zoloft , Topamax   DVT prophylaxis: Lovenox      Code Status: Full Code  Consults called: None  Admission status: Observation  Time spent: 49 minutes  Vicki Mcmillan CHRISTELLA Gail MD [facility] Pager [phone]  If 7PM-7AM, please contact night-coverage [email or web address] Password [facility]  11/05/2023, 5:06 PM

## 2023-11-06 DIAGNOSIS — Z79899 Other long term (current) drug therapy: Secondary | ICD-10-CM | POA: Diagnosis not present

## 2023-11-06 DIAGNOSIS — K219 Gastro-esophageal reflux disease without esophagitis: Secondary | ICD-10-CM | POA: Diagnosis present

## 2023-11-06 DIAGNOSIS — Z825 Family history of asthma and other chronic lower respiratory diseases: Secondary | ICD-10-CM | POA: Diagnosis not present

## 2023-11-06 DIAGNOSIS — Z91018 Allergy to other foods: Secondary | ICD-10-CM | POA: Diagnosis not present

## 2023-11-06 DIAGNOSIS — I252 Old myocardial infarction: Secondary | ICD-10-CM | POA: Diagnosis not present

## 2023-11-06 DIAGNOSIS — Z87891 Personal history of nicotine dependence: Secondary | ICD-10-CM | POA: Diagnosis not present

## 2023-11-06 DIAGNOSIS — Z885 Allergy status to narcotic agent status: Secondary | ICD-10-CM | POA: Diagnosis not present

## 2023-11-06 DIAGNOSIS — J441 Chronic obstructive pulmonary disease with (acute) exacerbation: Secondary | ICD-10-CM | POA: Diagnosis present

## 2023-11-06 DIAGNOSIS — R0902 Hypoxemia: Secondary | ICD-10-CM | POA: Diagnosis present

## 2023-11-06 DIAGNOSIS — Z7951 Long term (current) use of inhaled steroids: Secondary | ICD-10-CM | POA: Diagnosis not present

## 2023-11-06 DIAGNOSIS — Z888 Allergy status to other drugs, medicaments and biological substances status: Secondary | ICD-10-CM | POA: Diagnosis not present

## 2023-11-06 DIAGNOSIS — Z85828 Personal history of other malignant neoplasm of skin: Secondary | ICD-10-CM | POA: Diagnosis not present

## 2023-11-06 DIAGNOSIS — Z8541 Personal history of malignant neoplasm of cervix uteri: Secondary | ICD-10-CM | POA: Diagnosis not present

## 2023-11-06 DIAGNOSIS — Z1152 Encounter for screening for COVID-19: Secondary | ICD-10-CM | POA: Diagnosis not present

## 2023-11-06 DIAGNOSIS — F32A Depression, unspecified: Secondary | ICD-10-CM | POA: Diagnosis present

## 2023-11-06 DIAGNOSIS — J45909 Unspecified asthma, uncomplicated: Secondary | ICD-10-CM | POA: Diagnosis present

## 2023-11-06 LAB — CBC
HCT: 36.5 % (ref 36.0–46.0)
Hemoglobin: 11.7 g/dL — ABNORMAL LOW (ref 12.0–15.0)
MCH: 30.2 pg (ref 26.0–34.0)
MCHC: 32.1 g/dL (ref 30.0–36.0)
MCV: 94.3 fL (ref 80.0–100.0)
Platelets: 178 10*3/uL (ref 150–400)
RBC: 3.87 MIL/uL (ref 3.87–5.11)
RDW: 15 % (ref 11.5–15.5)
WBC: 5.5 10*3/uL (ref 4.0–10.5)
nRBC: 0 % (ref 0.0–0.2)

## 2023-11-06 LAB — BASIC METABOLIC PANEL
Anion gap: 7 (ref 5–15)
BUN: 18 mg/dL (ref 6–20)
CO2: 22 mmol/L (ref 22–32)
Calcium: 8.7 mg/dL — ABNORMAL LOW (ref 8.9–10.3)
Chloride: 112 mmol/L — ABNORMAL HIGH (ref 98–111)
Creatinine, Ser: 0.54 mg/dL (ref 0.44–1.00)
GFR, Estimated: >60 mL/min (ref >60)
Glucose, Bld: 106 mg/dL — ABNORMAL HIGH (ref 70–99)
Potassium: 4 mmol/L (ref 3.5–5.1)
Sodium: 141 mmol/L (ref 135–145)

## 2023-11-06 MED ORDER — HYDRALAZINE HCL 20 MG/ML IJ SOLN: 10.0000 mg | INTRAMUSCULAR | Status: DC | PRN

## 2023-11-06 MED ORDER — ACETAMINOPHEN 325 MG PO TABS: 650.0000 mg | ORAL_TABLET | Freq: Four times a day (QID) | ORAL | Status: DC | PRN

## 2023-11-06 MED ORDER — GUAIFENESIN 100 MG/5ML PO LIQD: 5.0000 mL | ORAL | Status: DC | PRN

## 2023-11-06 MED ORDER — METOPROLOL TARTRATE 5 MG/5ML IV SOLN: 5.0000 mg | INTRAVENOUS | Status: DC | PRN

## 2023-11-06 MED ORDER — ALBUTEROL SULFATE (2.5 MG/3ML) 0.083% IN NEBU
2.5000 mg | INHALATION_SOLUTION | Freq: Two times a day (BID) | RESPIRATORY_TRACT | Status: DC
  Administered 2023-11-06 – 2023-11-09 (×7): 2.5 mg via RESPIRATORY_TRACT
  Filled 2023-11-06 (×6): qty 3

## 2023-11-06 NOTE — Plan of Care (Signed)

## 2023-11-06 NOTE — TOC Initial Note (Signed)
 Transition of Care (TOC) - Initial/Assessment Note    Patient Details  Name: Vicki Mcmillan MRN: [id number] Date of Birth: July 19, 1971  Transition of Care [facility]) CM/SW Contact:    Sonda Manuella Quill, RN Phone Number: 11/06/2023, 11:21 AM  Clinical Narrative:                 TOC for d/c planning; spoke w/ pt in room; pt says she shares a mobile home w/ her mother; she plans to return at d/c; pt identified POC mother Elveria Carver [phone]; she confirmed insurance/PCP; pt denies experiencing SDOH risks; her mother will provide transportation; pt says she does not have DME, HH services, or home oxygen ; also explained MOON to patient; she verbalized understanding and signed document; copy of MOON given to pt; TOC will follow.  Expected Discharge Plan: Home/Self Care Barriers to Discharge: Continued Medical Work up   Patient Goals and CMS Choice Patient states their goals for this hospitalization and ongoing recovery are:: home CMS Medicare.gov Compare Post Acute Care list provided to:: Patient        Expected Discharge Plan and Services   Discharge Planning Services: CM Consult   Living arrangements for the past 2 months: Mobile Home                                      Prior Living Arrangements/Services Living arrangements for the past 2 months: Mobile Home Lives with:: Relatives Patient language and need for interpreter reviewed:: Yes Do you feel safe going back to the place where you live?: Yes      Need for Family Participation in Patient Care: Yes (Comment) Care giver support system in place?: Yes (comment) Current home services:  (n/a) Criminal Activity/Legal Involvement Pertinent to Current Situation/Hospitalization: No - Comment as needed  Activities of Daily Living   ADL Screening (condition at time of admission) Independently performs ADLs?: Yes (appropriate for developmental age) Is the patient deaf or have difficulty hearing?: No Does the  patient have difficulty seeing, even when wearing glasses/contacts?: No Does the patient have difficulty concentrating, remembering, or making decisions?: No  Permission Sought/Granted Permission sought to share information with : Case Manager Permission granted to share information with : Yes, Verbal Permission Granted  Share Information with NAME: Case Manager     Permission granted to share info w Relationship: Elveria Carver (mother) [phone]     Emotional Assessment Appearance:: Appears stated age Attitude/Demeanor/Rapport: Gracious Affect (typically observed): Accepting Orientation: : Oriented to Self, Oriented to Place, Oriented to  Time, Oriented to Situation Alcohol / Substance Use: Not Applicable Psych Involvement: No (comment)  Admission diagnosis:  COPD exacerbation (HCC) [J44.1] COPD with hypoxia (HCC) [J44.9] Patient Active Problem List   Diagnosis Date Noted   COPD exacerbation (HCC) 11/05/2023   Acute respiratory failure with hypoxia (HCC) 09/05/2023   COPD with acute exacerbation (HCC) 09/05/2023   Lung nodule 03/14/2022   Sensorineural hearing loss (SNHL) of both ears 02/12/2021   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   Tinnitus of both ears 02/12/2021   Tobacco use disorder 12/21/2018   Tobacco use 09/09/2018   Hypokalemia 09/09/2018   GERD (gastroesophageal reflux disease) 09/09/2018   Depression 09/08/2018   COPD (chronic obstructive pulmonary disease) (HCC) 09/08/2018   Anxiety 09/08/2018   GAD (generalized anxiety disorder) 07/23/2016   MDD (major depressive disorder), single episode, 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   PCP:  Teresa Aldona CROME,  NP Pharmacy:   [facility] [phone] - [place], [place] - [address] [place] [address] Phone: [phone] Fax: [phone]  [facility] [phone] - [place], [place] - [address] AT [facility] [address] [place] [address] Phone: [phone] Fax: [phone]  [facility] - [place], [place] - [address] [place] [address] Phone: [phone] Fax: [phone]  [facility] [phone] - [place], [place] - [address] [place] [address] Phone: [phone] Fax: [phone]     Social Drivers of Health (SDOH) Social History: SDOH Screenings   Food Insecurity: No Food Insecurity (11/06/2023)  Housing: Low Risk  (11/06/2023)  Transportation Needs: No Transportation Needs (11/06/2023)  Utilities: Not At Risk (11/06/2023)  Depression (PHQ2-9): Low Risk  (12/10/2021)  Recent Concern: Depression (PHQ2-9) - Medium Risk (10/09/2021)  Financial Resource Strain: Low Risk  (10/10/2023)   Received from [facility]  Physical Activity: Insufficiently Active (10/10/2023)   Received from [facility]  Social Connections: Socially Integrated (10/10/2023)   Received from [facility]  Stress: No Stress Concern Present (10/10/2023)   Received from [facility]  Tobacco Use: Medium Risk (11/04/2023)   SDOH Interventions: Food Insecurity Interventions: Intervention Not Indicated, Inpatient TOC Housing Interventions: Intervention Not Indicated, Inpatient TOC Transportation Interventions: Intervention Not Indicated, Inpatient TOC Utilities Interventions: Intervention Not Indicated, Inpatient TOC   Readmission Risk Interventions     No data to display

## 2023-11-06 NOTE — Hospital Course (Addendum)
 Brief Narrative:   53 y.o. female with medical history significant for prior tobacco abuse, GERD, COPD on room air, depression being admitted to the hospital with acute exacerbation of COPD. Noted by ER physicians to have diffuse wheezing, COVID and flu test were negative. She was started on nebulizer, IV steroids.  Over the course of 4 days patient did significantly well in the hospital, transition to p.o. prednisone .  She is stable for discharge today.  Will need to follow-up outpatient pulmonary.   Assessment & Plan:  Principal Problem:   COPD exacerbation (HCC)     Acute exacerbation of COPD - Breathing is significantly improved.  Continue home bronchodilators.  Prolonged prednisone  taper course over next 8 days has been prescribed.  She needs to follow-up outpatient pulmonary.  Would benefit from outpatient PFTs after this acute episode is over and repeat chest x-ray/CT scan about 4 to 6 weeks. -Flu, RSV, COVID-negative   GERD -p.o. PPI   Mood disorder -Zoloft , Topamax   DVT prophylaxis: enoxaparin  (LOVENOX ) injection 40 mg Start: 11/05/23 2200 SCDs Start: 11/05/23 1642    Code Status: Full Code Family Communication:   Stable for discharge  Subjective Very minimal expiratory wheezing but significantly improved.  Ambulating in the hallway without any evidence of hypoxia  Examination:  General exam: Appears calm and comfortable  Respiratory system: Very minimal expiratory wheezing Cardiovascular system: S1 & S2 heard, RRR. No JVD, murmurs, rubs, gallops or clicks. No pedal edema. Gastrointestinal system: Abdomen is nondistended, soft and nontender. No organomegaly or masses felt. Normal bowel sounds heard. Central nervous system: Alert and oriented. No focal neurological deficits. Extremities: Symmetric 5 x 5 power. Skin: No rashes, lesions or ulcers Psychiatry: Judgement and insight appear normal. Mood & affect appropriate.

## 2023-11-06 NOTE — Care Management Obs Status (Signed)
 MEDICARE OBSERVATION STATUS NOTIFICATION   Patient Details  Name: Vicki Mcmillan MRN: [id number] Date of Birth: 1971/07/29   Medicare Observation Status Notification Given:  Yes    Adrian Prows, RN 11/06/2023, 11:18 AM

## 2023-11-06 NOTE — Progress Notes (Signed)
 PROGRESS NOTE    Vicki Mcmillan  FMW:[id number] DOB: 1971/02/15 DOA: 11/04/2023 PCP: Teresa Aldona CROME, NP    Brief Narrative:   53 y.o. female with medical history significant for prior tobacco abuse, GERD, COPD on room air, depression being admitted to the hospital with acute exacerbation of COPD. Noted by ER physicians to have diffuse wheezing, COVID and flu test were negative. She was started on nebulizer, IV steroids.    Assessment & Plan:  Principal Problem:   COPD exacerbation (HCC)     Acute exacerbation of COPD -Diffuse wheezing.  Aggressive scheduled and as needed bronchodilators, IV steroids.  I-S/flutter valve. -Flu, RSV, COVID-negative   GERD -p.o. PPI   Mood disorder -Zoloft , Topamax   DVT prophylaxis: enoxaparin  (LOVENOX ) injection 40 mg Start: 11/05/23 2200 SCDs Start: 11/05/23 1642    Code Status: Full Code Family Communication:   Status is: Observation The patient remains OBS appropriate and will d/c before 2 midnights.    Subjective: Still feeling short of breath with quite a bit of congestion.   Examination:  General exam: Appears calm and comfortable  Respiratory system: Diffuse rhonchi with expiratory wheezing Cardiovascular system: S1 & S2 heard, RRR. No JVD, murmurs, rubs, gallops or clicks. No pedal edema. Gastrointestinal system: Abdomen is nondistended, soft and nontender. No organomegaly or masses felt. Normal bowel sounds heard. Central nervous system: Alert and oriented. No focal neurological deficits. Extremities: Symmetric 5 x 5 power. Skin: No rashes, lesions or ulcers Psychiatry: Judgement and insight appear normal. Mood & affect appropriate.                Diet Orders (From admission, onward)     Start     Ordered   11/05/23 1642  Diet regular Room service appropriate? Yes; Fluid consistency: Thin  Diet effective now       Question Answer Comment  Room service appropriate? Yes   Fluid consistency: Thin       11/05/23 1642            Objective: Vitals:   11/05/23 2037 11/06/23 0010 11/06/23 0405 11/06/23 0819  BP:  121/77 115/76   Pulse:  77 (!) 59   Resp:  19 18   Temp:  97.7 F (36.5 C) 97.8 F (36.6 C)   TempSrc:      SpO2: 96% 98% 99% 96%  Weight:      Height:       No intake or output data in the 24 hours ending 11/06/23 1217 Filed Weights   11/04/23 1811 11/05/23 1620  Weight: 56.7 kg 56.4 kg    Scheduled Meds:  albuterol   2.5 mg Nebulization BID   enoxaparin  (LOVENOX ) injection  40 mg Subcutaneous Q24H   fluticasone  furoate-vilanterol  1 puff Inhalation Daily   And   umeclidinium bromide   1 puff Inhalation Daily   loratadine   10 mg Oral Daily   methylPREDNISolone  (SOLU-MEDROL ) injection  40 mg Intravenous Q12H   pantoprazole   80 mg Oral QHS   sertraline   150 mg Oral Daily   topiramate   50 mg Oral QHS   Continuous Infusions:  Nutritional status     Body mass index is 23.49 kg/m.  Data Reviewed:   CBC: Recent Labs  Lab 11/04/23 1851 11/05/23 0044 11/06/23 0532  WBC 4.0  --  5.5  NEUTROABS 2.9  --   --   HGB 12.2 12.6 11.7*  HCT 36.7 37.0 36.5  MCV 91.5  --  94.3  PLT 174  --  178   Basic Metabolic Panel: Recent Labs  Lab 11/04/23 1851 11/05/23 0044 11/06/23 0532  NA 141 139 141  K 3.9 3.9 4.0  CL 108  --  112*  CO2 23  --  22  GLUCOSE 98  --  106*  BUN 14  --  18  CREATININE 0.79  --  0.54  CALCIUM 8.9  --  8.7*   GFR: Estimated Creatinine Clearance: 61.4 mL/min (by C-G formula based on SCr of 0.54 mg/dL). Liver Function Tests: No results for input(s): AST, ALT, ALKPHOS, BILITOT, PROT, ALBUMIN in the last 168 hours. No results for input(s): LIPASE, AMYLASE in the last 168 hours. No results for input(s): AMMONIA in the last 168 hours. Coagulation Profile: No results for input(s): INR, PROTIME in the last 168 hours. Cardiac Enzymes: No results for input(s): CKTOTAL, CKMB, CKMBINDEX, TROPONINI in the  last 168 hours. BNP (last 3 results) No results for input(s): PROBNP in the last 8760 hours. HbA1C: No results for input(s): HGBA1C in the last 72 hours. CBG: No results for input(s): GLUCAP in the last 168 hours. Lipid Profile: No results for input(s): CHOL, HDL, LDLCALC, TRIG, CHOLHDL, LDLDIRECT in the last 72 hours. Thyroid Function Tests: No results for input(s): TSH, T4TOTAL, FREET4, T3FREE, THYROIDAB in the last 72 hours. Anemia Panel: No results for input(s): VITAMINB12, FOLATE, FERRITIN, TIBC, IRON, RETICCTPCT in the last 72 hours. Sepsis Labs: No results for input(s): PROCALCITON, LATICACIDVEN in the last 168 hours.  Recent Results (from the past 240 hours)  Resp panel by RT-PCR (RSV, Flu A&B, Covid) Anterior Nasal Swab     Status: None   Collection Time: 11/04/23  9:56 PM   Specimen: Anterior Nasal Swab  Result Value Ref Range Status   SARS Coronavirus 2 by RT PCR NEGATIVE NEGATIVE Final    Comment: (NOTE) SARS-CoV-2 target nucleic acids are NOT DETECTED.  The SARS-CoV-2 RNA is generally detectable in upper respiratory specimens during the acute phase of infection. The lowest concentration of SARS-CoV-2 viral copies this assay can detect is 138 copies/mL. A negative result does not preclude SARS-Cov-2 infection and should not be used as the sole basis for treatment or other patient management decisions. A negative result may occur with  improper specimen collection/handling, submission of specimen other than nasopharyngeal swab, presence of viral mutation(s) within the areas targeted by this assay, and inadequate number of viral copies(<138 copies/mL). A negative result must be combined with clinical observations, patient history, and epidemiological information. The expected result is Negative.  Fact Sheet for Patients:  bloggercourse.com  Fact Sheet for Healthcare Providers:   seriousbroker.it  This test is no t yet approved or cleared by the [place]  FDA and  has been authorized for detection and/or diagnosis of SARS-CoV-2 by FDA under an Emergency Use Authorization (EUA). This EUA will remain  in effect (meaning this test can be used) for the duration of the COVID-19 declaration under Section 564(b)(1) of the Act, 21 U.S.C.section 360bbb-3(b)(1), unless the authorization is terminated  or revoked sooner.       Influenza A by PCR NEGATIVE NEGATIVE Final   Influenza B by PCR NEGATIVE NEGATIVE Final    Comment: (NOTE) The Xpert Xpress SARS-CoV-2/FLU/RSV plus assay is intended as an aid in the diagnosis of influenza from Nasopharyngeal swab specimens and should not be used as a sole basis for treatment. Nasal washings and aspirates are unacceptable for Xpert Xpress SARS-CoV-2/FLU/RSV testing.  Fact Sheet for Patients: bloggercourse.com  Fact Sheet for Healthcare Providers:  seriousbroker.it  This test is not yet approved or cleared by the [place]  FDA and has been authorized for detection and/or diagnosis of SARS-CoV-2 by FDA under an Emergency Use Authorization (EUA). This EUA will remain in effect (meaning this test can be used) for the duration of the COVID-19 declaration under Section 564(b)(1) of the Act, 21 U.S.C. section 360bbb-3(b)(1), unless the authorization is terminated or revoked.     Resp Syncytial Virus by PCR NEGATIVE NEGATIVE Final    Comment: (NOTE) Fact Sheet for Patients: bloggercourse.com  Fact Sheet for Healthcare Providers: seriousbroker.it  This test is not yet approved or cleared by the [place]  FDA and has been authorized for detection and/or diagnosis of SARS-CoV-2 by FDA under an Emergency Use Authorization (EUA). This EUA will remain in effect (meaning this test can be used) for  the duration of the COVID-19 declaration under Section 564(b)(1) of the Act, 21 U.S.C. section 360bbb-3(b)(1), unless the authorization is terminated or revoked.  Performed at [facility], [address], [place], [place] [address]          Radiology Studies: [facility] Chest [facility] 1 View Result Date: 11/04/2023 CLINICAL DATA:  [id number] SOB (shortness of breath) [id number] EXAM: PORTABLE CHEST - 1 VIEW COMPARISON:  09/05/2023. FINDINGS: Cardiac silhouette is unremarkable. No pneumothorax or pleural effusion. The lungs are clear. The visualized skeletal structures are unremarkable. IMPRESSION: No acute cardiopulmonary process. Electronically Signed   By: Fonda Field M.D.   On: 11/04/2023 19:38           LOS: 0 days   Time spent= 35 mins    Burgess JAYSON Dare, MD [facility]  If 7PM-7AM, please contact night-coverage  11/06/2023, 12:17 PM

## 2023-11-07 DIAGNOSIS — J441 Chronic obstructive pulmonary disease with (acute) exacerbation: Secondary | ICD-10-CM | POA: Diagnosis not present

## 2023-11-07 LAB — BASIC METABOLIC PANEL
Anion gap: 7 (ref 5–15)
BUN: 19 mg/dL (ref 6–20)
CO2: 25 mmol/L (ref 22–32)
Calcium: 8.9 mg/dL (ref 8.9–10.3)
Chloride: 108 mmol/L (ref 98–111)
Creatinine, Ser: 0.6 mg/dL (ref 0.44–1.00)
GFR, Estimated: >60 mL/min (ref >60)
Glucose, Bld: 107 mg/dL — ABNORMAL HIGH (ref 70–99)
Potassium: 4.4 mmol/L (ref 3.5–5.1)
Sodium: 140 mmol/L (ref 135–145)

## 2023-11-07 LAB — CBC
HCT: 37.5 % (ref 36.0–46.0)
Hemoglobin: 12.1 g/dL (ref 12.0–15.0)
MCH: 30.3 pg (ref 26.0–34.0)
MCHC: 32.3 g/dL (ref 30.0–36.0)
MCV: 94 fL (ref 80.0–100.0)
Platelets: 192 10*3/uL (ref 150–400)
RBC: 3.99 MIL/uL (ref 3.87–5.11)
RDW: 14.6 % (ref 11.5–15.5)
WBC: 6.4 10*3/uL (ref 4.0–10.5)
nRBC: 0 % (ref 0.0–0.2)

## 2023-11-07 LAB — BRAIN NATRIURETIC PEPTIDE: B Natriuretic Peptide: 67.1 pg/mL (ref 0.0–100.0)

## 2023-11-07 LAB — PHOSPHORUS: Phosphorus: 4.3 mg/dL (ref 2.5–4.6)

## 2023-11-07 LAB — MAGNESIUM: Magnesium: 2.2 mg/dL (ref 1.7–2.4)

## 2023-11-07 NOTE — Plan of Care (Signed)

## 2023-11-07 NOTE — Progress Notes (Signed)
 PROGRESS NOTE    Vicki Mcmillan  FMW:[id number] DOB: 1971/08/24 DOA: 11/04/2023 PCP: Teresa Aldona CROME, NP    Brief Narrative:   53 y.o. female with medical history significant for prior tobacco abuse, GERD, COPD on room air, depression being admitted to the hospital with acute exacerbation of COPD. Noted by ER physicians to have diffuse wheezing, COVID and flu test were negative. She was started on nebulizer, IV steroids.    Assessment & Plan:  Principal Problem:   COPD exacerbation (HCC)     Acute exacerbation of COPD -Diffuse wheezing, slowly improving.  Aggressive scheduled and as needed bronchodilators, IV steroids.  I-S/flutter valve. -Flu, RSV, COVID-negative   GERD -p.o. PPI   Mood disorder -Zoloft , Topamax   DVT prophylaxis: enoxaparin  (LOVENOX ) injection 40 mg Start: 11/05/23 2200 SCDs Start: 11/05/23 1642    Code Status: Full Code Family Communication:   Continue hospital stay.  Still has quite a bit abnormal breath sounds   Subjective: Slow to improve Tells me she feels little better than yesterday  Examination:  General exam: Appears calm and comfortable  Respiratory system: Diffuse rhonchi with expiratory wheezing, slightly better compared to yesterday Cardiovascular system: S1 & S2 heard, RRR. No JVD, murmurs, rubs, gallops or clicks. No pedal edema. Gastrointestinal system: Abdomen is nondistended, soft and nontender. No organomegaly or masses felt. Normal bowel sounds heard. Central nervous system: Alert and oriented. No focal neurological deficits. Extremities: Symmetric 5 x 5 power. Skin: No rashes, lesions or ulcers Psychiatry: Judgement and insight appear normal. Mood & affect appropriate.                Diet Orders (From admission, onward)     Start     Ordered   11/05/23 1642  Diet regular Room service appropriate? Yes; Fluid consistency: Thin  Diet effective now       Question Answer Comment  Room service appropriate? Yes   Fluid  consistency: Thin      11/05/23 1642            Objective: Vitals:   11/06/23 1931 11/06/23 2223 11/07/23 0354 11/07/23 0934  BP: 117/72  115/76   Pulse: 73  62   Resp: 18  17   Temp: 98.1 F (36.7 C)  97.9 F (36.6 C)   TempSrc:      SpO2: 98% 96% 99% 94%  Weight:      Height:        Intake/Output Summary (Last 24 hours) at 11/07/2023 1045 Last data filed at 11/07/2023 0825 Gross per 24 hour  Intake 240 ml  Output --  Net 240 ml   Filed Weights   11/04/23 1811 11/05/23 1620  Weight: 56.7 kg 56.4 kg    Scheduled Meds:  albuterol   2.5 mg Nebulization BID   enoxaparin  (LOVENOX ) injection  40 mg Subcutaneous Q24H   fluticasone  furoate-vilanterol  1 puff Inhalation Daily   And   umeclidinium bromide   1 puff Inhalation Daily   loratadine   10 mg Oral Daily   methylPREDNISolone  (SOLU-MEDROL ) injection  40 mg Intravenous Q12H   pantoprazole   80 mg Oral QHS   sertraline   150 mg Oral Daily   topiramate   50 mg Oral QHS   Continuous Infusions:  Nutritional status     Body mass index is 23.49 kg/m.  Data Reviewed:   CBC: Recent Labs  Lab 11/04/23 1851 11/05/23 0044 11/06/23 0532 11/07/23 0718  WBC 4.0  --  5.5 6.4  NEUTROABS 2.9  --   --   --  HGB 12.2 12.6 11.7* 12.1  HCT 36.7 37.0 36.5 37.5  MCV 91.5  --  94.3 94.0  PLT 174  --  178 192   Basic Metabolic Panel: Recent Labs  Lab 11/04/23 1851 11/05/23 0044 11/06/23 0532 11/07/23 0718  NA 141 139 141 140  K 3.9 3.9 4.0 4.4  CL 108  --  112* 108  CO2 23  --  22 25  GLUCOSE 98  --  106* 107*  BUN 14  --  18 19  CREATININE 0.79  --  0.54 0.60  CALCIUM 8.9  --  8.7* 8.9  MG  --   --   --  2.2  PHOS  --   --   --  4.3   GFR: Estimated Creatinine Clearance: 61.4 mL/min (by C-G formula based on SCr of 0.6 mg/dL). Liver Function Tests: No results for input(s): AST, ALT, ALKPHOS, BILITOT, PROT, ALBUMIN in the last 168 hours. No results for input(s): LIPASE, AMYLASE in the last 168  hours. No results for input(s): AMMONIA in the last 168 hours. Coagulation Profile: No results for input(s): INR, PROTIME in the last 168 hours. Cardiac Enzymes: No results for input(s): CKTOTAL, CKMB, CKMBINDEX, TROPONINI in the last 168 hours. BNP (last 3 results) No results for input(s): PROBNP in the last 8760 hours. HbA1C: No results for input(s): HGBA1C in the last 72 hours. CBG: No results for input(s): GLUCAP in the last 168 hours. Lipid Profile: No results for input(s): CHOL, HDL, LDLCALC, TRIG, CHOLHDL, LDLDIRECT in the last 72 hours. Thyroid Function Tests: No results for input(s): TSH, T4TOTAL, FREET4, T3FREE, THYROIDAB in the last 72 hours. Anemia Panel: No results for input(s): VITAMINB12, FOLATE, FERRITIN, TIBC, IRON, RETICCTPCT in the last 72 hours. Sepsis Labs: No results for input(s): PROCALCITON, LATICACIDVEN in the last 168 hours.  Recent Results (from the past 240 hours)  Resp panel by RT-PCR (RSV, Flu A&B, Covid) Anterior Nasal Swab     Status: None   Collection Time: 11/04/23  9:56 PM   Specimen: Anterior Nasal Swab  Result Value Ref Range Status   SARS Coronavirus 2 by RT PCR NEGATIVE NEGATIVE Final    Comment: (NOTE) SARS-CoV-2 target nucleic acids are NOT DETECTED.  The SARS-CoV-2 RNA is generally detectable in upper respiratory specimens during the acute phase of infection. The lowest concentration of SARS-CoV-2 viral copies this assay can detect is 138 copies/mL. A negative result does not preclude SARS-Cov-2 infection and should not be used as the sole basis for treatment or other patient management decisions. A negative result may occur with  improper specimen collection/handling, submission of specimen other than nasopharyngeal swab, presence of viral mutation(s) within the areas targeted by this assay, and inadequate number of viral copies(<138 copies/mL). A negative result must be  combined with clinical observations, patient history, and epidemiological information. The expected result is Negative.  Fact Sheet for Patients:  bloggercourse.com  Fact Sheet for Healthcare Providers:  seriousbroker.it  This test is no t yet approved or cleared by the [place]  FDA and  has been authorized for detection and/or diagnosis of SARS-CoV-2 by FDA under an Emergency Use Authorization (EUA). This EUA will remain  in effect (meaning this test can be used) for the duration of the COVID-19 declaration under Section 564(b)(1) of the Act, 21 U.S.C.section 360bbb-3(b)(1), unless the authorization is terminated  or revoked sooner.       Influenza A by PCR NEGATIVE NEGATIVE Final   Influenza B by PCR NEGATIVE NEGATIVE  Final    Comment: (NOTE) The Xpert Xpress SARS-CoV-2/FLU/RSV plus assay is intended as an aid in the diagnosis of influenza from Nasopharyngeal swab specimens and should not be used as a sole basis for treatment. Nasal washings and aspirates are unacceptable for Xpert Xpress SARS-CoV-2/FLU/RSV testing.  Fact Sheet for Patients: bloggercourse.com  Fact Sheet for Healthcare Providers: seriousbroker.it  This test is not yet approved or cleared by the [place]  FDA and has been authorized for detection and/or diagnosis of SARS-CoV-2 by FDA under an Emergency Use Authorization (EUA). This EUA will remain in effect (meaning this test can be used) for the duration of the COVID-19 declaration under Section 564(b)(1) of the Act, 21 U.S.C. section 360bbb-3(b)(1), unless the authorization is terminated or revoked.     Resp Syncytial Virus by PCR NEGATIVE NEGATIVE Final    Comment: (NOTE) Fact Sheet for Patients: bloggercourse.com  Fact Sheet for Healthcare Providers: seriousbroker.it  This test is not yet  approved or cleared by the [place]  FDA and has been authorized for detection and/or diagnosis of SARS-CoV-2 by FDA under an Emergency Use Authorization (EUA). This EUA will remain in effect (meaning this test can be used) for the duration of the COVID-19 declaration under Section 564(b)(1) of the Act, 21 U.S.C. section 360bbb-3(b)(1), unless the authorization is terminated or revoked.  Performed at [facility], [address] [place], [place] [address]          Radiology Studies: No results found.         LOS: 1 day   Time spent= 35 mins    Burgess JAYSON Dare, MD [facility]  If 7PM-7AM, please contact night-coverage  11/07/2023, 10:45 AM

## 2023-11-08 DIAGNOSIS — J441 Chronic obstructive pulmonary disease with (acute) exacerbation: Secondary | ICD-10-CM | POA: Diagnosis not present

## 2023-11-08 LAB — BASIC METABOLIC PANEL
Anion gap: 5 (ref 5–15)
BUN: 18 mg/dL (ref 6–20)
CO2: 27 mmol/L (ref 22–32)
Calcium: 9 mg/dL (ref 8.9–10.3)
Chloride: 109 mmol/L (ref 98–111)
Creatinine, Ser: 0.66 mg/dL (ref 0.44–1.00)
GFR, Estimated: >60 mL/min (ref >60)
Glucose, Bld: 123 mg/dL — ABNORMAL HIGH (ref 70–99)
Potassium: 4.6 mmol/L (ref 3.5–5.1)
Sodium: 141 mmol/L (ref 135–145)

## 2023-11-08 LAB — CBC
HCT: 35.6 % — ABNORMAL LOW (ref 36.0–46.0)
Hemoglobin: 11.8 g/dL — ABNORMAL LOW (ref 12.0–15.0)
MCH: 30.8 pg (ref 26.0–34.0)
MCHC: 33.1 g/dL (ref 30.0–36.0)
MCV: 93 fL (ref 80.0–100.0)
Platelets: 187 10*3/uL (ref 150–400)
RBC: 3.83 MIL/uL — ABNORMAL LOW (ref 3.87–5.11)
RDW: 14.6 % (ref 11.5–15.5)
WBC: 6.1 10*3/uL (ref 4.0–10.5)
nRBC: 0 % (ref 0.0–0.2)

## 2023-11-08 LAB — MAGNESIUM: Magnesium: 2.2 mg/dL (ref 1.7–2.4)

## 2023-11-08 MED ORDER — PREDNISONE 20 MG PO TABS
40.0000 mg | ORAL_TABLET | Freq: Every day | ORAL | Status: DC
  Administered 2023-11-09: 40 mg via ORAL
  Filled 2023-11-08: qty 2

## 2023-11-08 NOTE — Plan of Care (Signed)

## 2023-11-08 NOTE — Progress Notes (Signed)
SATURATION QUALIFICATIONS: (This note is used to comply with regulatory documentation for home oxygen)  Patient Saturations on Room Air at Rest = 94%  Patient Saturations on Room Air while Ambulating = 92%  Patient Saturations on 0 Liters of oxygen while Ambulating = 0%  Please briefly explain why patient needs home oxygen:

## 2023-11-08 NOTE — Plan of Care (Signed)
 Pt is A&O x 4. VSS, continues on 2L O2/[place], denies SOB. Ambulates independently in the room. Safety maintained. Call bell in reach. Will continue to monitor.   Problem: Education: Goal: Knowledge of General Education information will improve Description: Including pain rating scale, medication(s)/side effects and non-pharmacologic comfort measures Outcome: Progressing   Problem: Health Behavior/Discharge Planning: Goal: Ability to manage health-related needs will improve Outcome: Progressing   Problem: Clinical Measurements: Goal: Ability to maintain clinical measurements within normal limits will improve Outcome: Progressing Goal: Will remain free from infection Outcome: Progressing Goal: Diagnostic test results will improve Outcome: Progressing Goal: Respiratory complications will improve Outcome: Progressing Goal: Cardiovascular complication will be avoided Outcome: Progressing   Problem: Activity: Goal: Risk for activity intolerance will decrease Outcome: Progressing   Problem: Nutrition: Goal: Adequate nutrition will be maintained Outcome: Progressing   Problem: Coping: Goal: Level of anxiety will decrease Outcome: Progressing   Problem: Elimination: Goal: Will not experience complications related to bowel motility Outcome: Progressing Goal: Will not experience complications related to urinary retention Outcome: Progressing   Problem: Pain Management: Goal: General experience of comfort will improve Outcome: Progressing   Problem: Safety: Goal: Ability to remain free from injury will improve Outcome: Progressing   Problem: Skin Integrity: Goal: Risk for impaired skin integrity will decrease Outcome: Progressing

## 2023-11-08 NOTE — Progress Notes (Signed)
 PROGRESS NOTE    Vicki Mcmillan  FMW:[id number] DOB: September 30, 1971 DOA: 11/04/2023 PCP: Teresa Aldona CROME, NP    Brief Narrative:   53 y.o. female with medical history significant for prior tobacco abuse, GERD, COPD on room air, depression being admitted to the hospital with acute exacerbation of COPD. Noted by ER physicians to have diffuse wheezing, COVID and flu test were negative. She was started on nebulizer, IV steroids.    Assessment & Plan:  Principal Problem:   COPD exacerbation (HCC)     Acute exacerbation of COPD -Diffuse wheezing, slowly improving.  Aggressive scheduled and as needed bronchodilators, IV steroids.  I-S/flutter valve. -Flu, RSV, COVID-negative   GERD -p.o. PPI   Mood disorder -Zoloft , Topamax   DVT prophylaxis: enoxaparin  (LOVENOX ) injection 40 mg Start: 11/05/23 2200 SCDs Start: 11/05/23 1642    Code Status: Full Code Family Communication:   Continue hospital stay.  Still has quite a bit abnormal breath sounds\ Hopefully dc in 24 hrs.    Subjective Still having some exertional dyspnea but better.   Examination:  General exam: Appears calm and comfortable  Respiratory system: Diffuse rhonchi with expiratory wheezing, Improved.  Cardiovascular system: S1 & S2 heard, RRR. No JVD, murmurs, rubs, gallops or clicks. No pedal edema. Gastrointestinal system: Abdomen is nondistended, soft and nontender. No organomegaly or masses felt. Normal bowel sounds heard. Central nervous system: Alert and oriented. No focal neurological deficits. Extremities: Symmetric 5 x 5 power. Skin: No rashes, lesions or ulcers Psychiatry: Judgement and insight appear normal. Mood & affect appropriate.                Diet Orders (From admission, onward)     Start     Ordered   11/05/23 1642  Diet regular Room service appropriate? Yes; Fluid consistency: Thin  Diet effective now       Question Answer Comment  Room service appropriate? Yes   Fluid consistency:  Thin      11/05/23 1642            Objective: Vitals:   11/07/23 1929 11/07/23 2020 11/08/23 0542 11/08/23 0852  BP: (!) 115/49  108/71   Pulse: 72  (!) 57   Resp: 20  15   Temp: 98.7 F (37.1 C)  97.8 F (36.6 C)   TempSrc: Oral  Oral   SpO2: 98% 98% 98% 97%  Weight:      Height:        Intake/Output Summary (Last 24 hours) at 11/08/2023 1103 Last data filed at 11/07/2023 1840 Gross per 24 hour  Intake 960 ml  Output --  Net 960 ml   Filed Weights   11/04/23 1811 11/05/23 1620  Weight: 56.7 kg 56.4 kg    Scheduled Meds:  albuterol   2.5 mg Nebulization BID   enoxaparin  (LOVENOX ) injection  40 mg Subcutaneous Q24H   fluticasone  furoate-vilanterol  1 puff Inhalation Daily   And   umeclidinium bromide   1 puff Inhalation Daily   loratadine   10 mg Oral Daily   pantoprazole   80 mg Oral QHS   [START ON 11/09/2023] predniSONE   40 mg Oral Q breakfast   sertraline   150 mg Oral Daily   topiramate   50 mg Oral QHS   Continuous Infusions:  Nutritional status     Body mass index is 23.49 kg/m.  Data Reviewed:   CBC: Recent Labs  Lab 11/04/23 1851 11/05/23 0044 11/06/23 0532 11/07/23 0718 11/08/23 0508  WBC 4.0  --  5.5 6.4 6.1  NEUTROABS 2.9  --   --   --   --   HGB 12.2 12.6 11.7* 12.1 11.8*  HCT 36.7 37.0 36.5 37.5 35.6*  MCV 91.5  --  94.3 94.0 93.0  PLT 174  --  178 192 187   Basic Metabolic Panel: Recent Labs  Lab 11/04/23 1851 11/05/23 0044 11/06/23 0532 11/07/23 0718 11/08/23 0508  NA 141 139 141 140 141  K 3.9 3.9 4.0 4.4 4.6  CL 108  --  112* 108 109  CO2 23  --  22 25 27   GLUCOSE 98  --  106* 107* 123*  BUN 14  --  18 19 18   CREATININE 0.79  --  0.54 0.60 0.66  CALCIUM 8.9  --  8.7* 8.9 9.0  MG  --   --   --  2.2 2.2  PHOS  --   --   --  4.3  --    GFR: Estimated Creatinine Clearance: 61.4 mL/min (by C-G formula based on SCr of 0.66 mg/dL). Liver Function Tests: No results for input(s): AST, ALT, ALKPHOS, BILITOT, PROT,  ALBUMIN in the last 168 hours. No results for input(s): LIPASE, AMYLASE in the last 168 hours. No results for input(s): AMMONIA in the last 168 hours. Coagulation Profile: No results for input(s): INR, PROTIME in the last 168 hours. Cardiac Enzymes: No results for input(s): CKTOTAL, CKMB, CKMBINDEX, TROPONINI in the last 168 hours. BNP (last 3 results) No results for input(s): PROBNP in the last 8760 hours. HbA1C: No results for input(s): HGBA1C in the last 72 hours. CBG: No results for input(s): GLUCAP in the last 168 hours. Lipid Profile: No results for input(s): CHOL, HDL, LDLCALC, TRIG, CHOLHDL, LDLDIRECT in the last 72 hours. Thyroid Function Tests: No results for input(s): TSH, T4TOTAL, FREET4, T3FREE, THYROIDAB in the last 72 hours. Anemia Panel: No results for input(s): VITAMINB12, FOLATE, FERRITIN, TIBC, IRON, RETICCTPCT in the last 72 hours. Sepsis Labs: No results for input(s): PROCALCITON, LATICACIDVEN in the last 168 hours.  Recent Results (from the past 240 hours)  Resp panel by RT-PCR (RSV, Flu A&B, Covid) Anterior Nasal Swab     Status: None   Collection Time: 11/04/23  9:56 PM   Specimen: Anterior Nasal Swab  Result Value Ref Range Status   SARS Coronavirus 2 by RT PCR NEGATIVE NEGATIVE Final    Comment: (NOTE) SARS-CoV-2 target nucleic acids are NOT DETECTED.  The SARS-CoV-2 RNA is generally detectable in upper respiratory specimens during the acute phase of infection. The lowest concentration of SARS-CoV-2 viral copies this assay can detect is 138 copies/mL. A negative result does not preclude SARS-Cov-2 infection and should not be used as the sole basis for treatment or other patient management decisions. A negative result may occur with  improper specimen collection/handling, submission of specimen other than nasopharyngeal swab, presence of viral mutation(s) within the areas targeted by this  assay, and inadequate number of viral copies(<138 copies/mL). A negative result must be combined with clinical observations, patient history, and epidemiological information. The expected result is Negative.  Fact Sheet for Patients:  bloggercourse.com  Fact Sheet for Healthcare Providers:  seriousbroker.it  This test is no t yet approved or cleared by the [place]  FDA and  has been authorized for detection and/or diagnosis of SARS-CoV-2 by FDA under an Emergency Use Authorization (EUA). This EUA will remain  in effect (meaning this test can be used) for the duration of the COVID-19 declaration under Section 564(b)(1) of the Act,  21 U.S.C.section 360bbb-3(b)(1), unless the authorization is terminated  or revoked sooner.       Influenza A by PCR NEGATIVE NEGATIVE Final   Influenza B by PCR NEGATIVE NEGATIVE Final    Comment: (NOTE) The Xpert Xpress SARS-CoV-2/FLU/RSV plus assay is intended as an aid in the diagnosis of influenza from Nasopharyngeal swab specimens and should not be used as a sole basis for treatment. Nasal washings and aspirates are unacceptable for Xpert Xpress SARS-CoV-2/FLU/RSV testing.  Fact Sheet for Patients: bloggercourse.com  Fact Sheet for Healthcare Providers: seriousbroker.it  This test is not yet approved or cleared by the [place]  FDA and has been authorized for detection and/or diagnosis of SARS-CoV-2 by FDA under an Emergency Use Authorization (EUA). This EUA will remain in effect (meaning this test can be used) for the duration of the COVID-19 declaration under Section 564(b)(1) of the Act, 21 U.S.C. section 360bbb-3(b)(1), unless the authorization is terminated or revoked.     Resp Syncytial Virus by PCR NEGATIVE NEGATIVE Final    Comment: (NOTE) Fact Sheet for Patients: bloggercourse.com  Fact Sheet for  Healthcare Providers: seriousbroker.it  This test is not yet approved or cleared by the [place]  FDA and has been authorized for detection and/or diagnosis of SARS-CoV-2 by FDA under an Emergency Use Authorization (EUA). This EUA will remain in effect (meaning this test can be used) for the duration of the COVID-19 declaration under Section 564(b)(1) of the Act, 21 U.S.C. section 360bbb-3(b)(1), unless the authorization is terminated or revoked.  Performed at [facility], [address], [place], [place] [address]          Radiology Studies: No results found.         LOS: 2 days   Time spent= 35 mins    Burgess JAYSON Dare, MD [facility]  If 7PM-7AM, please contact night-coverage  11/08/2023, 11:03 AM

## 2023-11-08 NOTE — Progress Notes (Signed)
 Report given to Ruel Favors, RN for continuation of care

## 2023-11-09 DIAGNOSIS — J441 Chronic obstructive pulmonary disease with (acute) exacerbation: Secondary | ICD-10-CM | POA: Diagnosis not present

## 2023-11-09 LAB — BASIC METABOLIC PANEL
Anion gap: 7 (ref 5–15)
BUN: 20 mg/dL (ref 6–20)
CO2: 26 mmol/L (ref 22–32)
Calcium: 8.8 mg/dL — ABNORMAL LOW (ref 8.9–10.3)
Chloride: 109 mmol/L (ref 98–111)
Creatinine, Ser: 0.82 mg/dL (ref 0.44–1.00)
GFR, Estimated: >60 mL/min (ref >60)
Glucose, Bld: 93 mg/dL (ref 70–99)
Potassium: 3.6 mmol/L (ref 3.5–5.1)
Sodium: 142 mmol/L (ref 135–145)

## 2023-11-09 LAB — CBC
HCT: 36.8 % (ref 36.0–46.0)
Hemoglobin: 11.8 g/dL — ABNORMAL LOW (ref 12.0–15.0)
MCH: 30.3 pg (ref 26.0–34.0)
MCHC: 32.1 g/dL (ref 30.0–36.0)
MCV: 94.4 fL (ref 80.0–100.0)
Platelets: 186 10*3/uL (ref 150–400)
RBC: 3.9 MIL/uL (ref 3.87–5.11)
RDW: 14.6 % (ref 11.5–15.5)
WBC: 6.7 10*3/uL (ref 4.0–10.5)
nRBC: 0 % (ref 0.0–0.2)

## 2023-11-09 LAB — MAGNESIUM: Magnesium: 2.1 mg/dL (ref 1.7–2.4)

## 2023-11-09 MED ORDER — PREDNISONE 20 MG PO TABS: ORAL_TABLET | ORAL | 0 refills | Status: AC

## 2023-11-09 MED ORDER — ALBUTEROL SULFATE HFA 108 (90 BASE) MCG/ACT IN AERS: 1.0000 | INHALATION_SPRAY | Freq: Four times a day (QID) | RESPIRATORY_TRACT | 1 refills | Status: DC | PRN

## 2023-11-09 NOTE — TOC Transition Note (Signed)
 Transition of Care [facility]) - Discharge Note   Patient Details  Name: Vicki Mcmillan MRN: [id number] Date of Birth: 03-10-1971  [facility] CM/SW Contact:  Dalila Camellia SAUNDERS, LCSW Phone Number: 11/09/2023, 10:54 AM   Clinical Narrative:     Patient does not have any TOC needs.  Patient will be discharging back home.  TOC signing off, please reconsult if other TOC needs arise.  Final next level of care: Home/Self Care Barriers to Discharge: Barriers Resolved   Patient Goals and CMS Choice Patient states their goals for this hospitalization and ongoing recovery are:: To return back home CMS Medicare.gov Compare Post Acute Care list provided to:: Patient        Discharge Placement                       Discharge Plan and Services Additional resources added to the After Visit Summary for     Discharge Planning Services: CM Consult                                 Social Drivers of Health (SDOH) Interventions SDOH Screenings   Food Insecurity: No Food Insecurity (11/06/2023)  Housing: Low Risk  (11/06/2023)  Transportation Needs: No Transportation Needs (11/06/2023)  Utilities: Not At Risk (11/06/2023)  Depression (PHQ2-9): Low Risk  (12/10/2021)  Recent Concern: Depression (PHQ2-9) - Medium Risk (10/09/2021)  Financial Resource Strain: Low Risk  (10/10/2023)   Received from [facility]  Physical Activity: Insufficiently Active (10/10/2023)   Received from [facility]  Social Connections: Socially Integrated (10/10/2023)   Received from [facility]  Stress: No Stress Concern Present (10/10/2023)   Received from [facility]  Tobacco Use: Medium Risk (11/04/2023)     Readmission Risk Interventions     No data to display

## 2023-11-09 NOTE — Plan of Care (Signed)

## 2023-11-09 NOTE — Discharge Summary (Signed)
 Physician Discharge Summary  Vicki Mcmillan FMW:[id number] DOB: Mar 11, 1971 DOA: 11/04/2023  PCP: Teresa Aldona CROME, NP  Admit date: 11/04/2023 Discharge date: 11/09/2023  Admitted From: Home Disposition: Home  Recommendations for Outpatient Follow-up:  Follow up with PCP in 1-2 weeks Please obtain BMP/CBC in one week your next doctors visit.  Prednisone  taper over next 8 days Continue using home bronchodilators.  Follow-up outpatient pulmonary Repeat chest x-ray or CT scan about 4 to 6 weeks   Discharge Condition: Stable CODE STATUS: Full code Diet recommendation: Regular  Brief/Interim Summary: Brief Narrative:   53 y.o. female with medical history significant for prior tobacco abuse, GERD, COPD on room air, depression being admitted to the hospital with acute exacerbation of COPD. Noted by ER physicians to have diffuse wheezing, COVID and flu test were negative. She was started on nebulizer, IV steroids.  Over the course of 4 days patient did significantly well in the hospital, transition to p.o. prednisone .  She is stable for discharge today.  Will need to follow-up outpatient pulmonary.   Assessment & Plan:  Principal Problem:   COPD exacerbation (HCC)     Acute exacerbation of COPD - Breathing is significantly improved.  Continue home bronchodilators.  Prolonged prednisone  taper course over next 8 days has been prescribed.  She needs to follow-up outpatient pulmonary.  Would benefit from outpatient PFTs after this acute episode is over and repeat chest x-ray/CT scan about 4 to 6 weeks. -Flu, RSV, COVID-negative   GERD -p.o. PPI   Mood disorder -Zoloft , Topamax   DVT prophylaxis: enoxaparin  (LOVENOX ) injection 40 mg Start: 11/05/23 2200 SCDs Start: 11/05/23 1642    Code Status: Full Code Family Communication:   Stable for discharge  Subjective Very minimal expiratory wheezing but significantly improved.  Ambulating in the hallway without any evidence of  hypoxia  Examination:  General exam: Appears calm and comfortable  Respiratory system: Very minimal expiratory wheezing Cardiovascular system: S1 & S2 heard, RRR. No JVD, murmurs, rubs, gallops or clicks. No pedal edema. Gastrointestinal system: Abdomen is nondistended, soft and nontender. No organomegaly or masses felt. Normal bowel sounds heard. Central nervous system: Alert and oriented. No focal neurological deficits. Extremities: Symmetric 5 x 5 power. Skin: No rashes, lesions or ulcers Psychiatry: Judgement and insight appear normal. Mood & affect appropriate.    Discharge Diagnoses:  Principal Problem:   COPD exacerbation (HCC) Active Problems:   Asthma       Discharge Exam: Vitals:   11/09/23 0851 11/09/23 0916  BP: 121/75   Pulse: 72   Resp: 18   Temp: 98.6 F (37 C)   SpO2: 98% 97%   Vitals:   11/08/23 2158 11/09/23 0449 11/09/23 0851 11/09/23 0916  BP:  102/67 121/75   Pulse:  63 72   Resp:  16 18   Temp:  98 F (36.7 C) 98.6 F (37 C)   TempSrc:  Oral Oral   SpO2: 97% 94% 98% 97%  Weight:      Height:         Discharge Instructions   Allergies as of 11/09/2023       Reactions   Hydrocodone-acetaminophen  Nausea And Vomiting   Tomato Rash   Bupropion Other (See Comments)   hallucinations   Oxycodone Nausea And Vomiting, Other (See Comments)   Stomach upset   Varenicline Other (See Comments)   hallucination        Medication List     TAKE these medications    albuterol  108 (90 Base)  MCG/ACT inhaler Commonly known as: VENTOLIN  HFA Inhale 1-2 puffs into the lungs every 6 (six) hours as needed for wheezing or shortness of breath. INHALE 1 TO 2 PUFFS INTO THE LUNGS EVERY 6 HOURS AS NEEDED FOR WHEEZING OR SHORTNESS OF BREATH What changed:  how much to take how to take this when to take this reasons to take this   ciclopirox 8 % solution Commonly known as: PENLAC Apply topically See admin instructions. Apply topically at bedtime.  Apply daily on nail and surrounding skin over previous coat. After 7 days remove with alcohol and repeat cycle.   ibuprofen  200 MG tablet Commonly known as: ADVIL  Take 200 mg by mouth every 6 (six) hours as needed for headache.   ipratropium-albuterol  0.5-2.5 (3) MG/3ML Soln Commonly known as: DUONEB Take 3 mLs by nebulization 2 (two) times daily as needed (asthma).   loratadine  10 MG tablet Commonly known as: CLARITIN  Take 10 mg by mouth daily.   pantoprazole  40 MG tablet Commonly known as: PROTONIX  Take 1 tablet (40 mg total) by mouth daily. What changed:  how much to take when to take this   predniSONE  20 MG tablet Commonly known as: DELTASONE  Take 2 tablets (40 mg total) by mouth daily with breakfast for 2 days, THEN 1.5 tablets (30 mg total) daily with breakfast for 2 days, THEN 1 tablet (20 mg total) daily with breakfast for 2 days, THEN 0.5 tablets (10 mg total) daily with breakfast for 2 days. Start taking on: November 10, 2023   prochlorperazine 5 MG tablet Commonly known as: COMPAZINE Take 5 mg by mouth every 8 (eight) hours as needed for vomiting or nausea.   rizatriptan  5 MG disintegrating tablet Commonly known as: MAXALT -MLT Take 5 mg by mouth as needed for migraine.   sertraline  100 MG tablet Commonly known as: ZOLOFT  Take 1.5 tablets (150 mg total) by mouth daily.   topiramate  50 MG tablet Commonly known as: TOPAMAX  Take 50 mg by mouth at bedtime.   Trelegy Ellipta  200-62.5-25 MCG/ACT Aepb Generic drug: Fluticasone -Umeclidin-Vilant INHALE 1 PUFF EVERY DAY        Follow-up Information     Teresa Georgi L, NP Follow up in 1 week(s).   Specialty: Family Medicine Contact information: [address] [place] [address] [phone]                Allergies  Allergen Reactions   Hydrocodone-Acetaminophen  Nausea And Vomiting   Tomato Rash   Bupropion Other (See Comments)    hallucinations   Oxycodone Nausea And Vomiting and Other  (See Comments)    Stomach upset   Varenicline Other (See Comments)    hallucination    You were cared for by a hospitalist during your hospital stay. If you have any questions about your discharge medications or the care you received while you were in the hospital after you are discharged, you can call the unit and asked to speak with the hospitalist on call if the hospitalist that took care of you is not available. Once you are discharged, your primary care physician will handle any further medical issues. Please note that no refills for any discharge medications will be authorized once you are discharged, as it is imperative that you return to your primary care physician (or establish a relationship with a primary care physician if you do not have one) for your aftercare needs so that they can reassess your need for medications and monitor your lab values.  You  were cared for by a hospitalist during your hospital stay. If you have any questions about your discharge medications or the care you received while you were in the hospital after you are discharged, you can call the unit and asked to speak with the hospitalist on call if the hospitalist that took care of you is not available. Once you are discharged, your primary care physician will handle any further medical issues. Please note that NO REFILLS for any discharge medications will be authorized once you are discharged, as it is imperative that you return to your primary care physician (or establish a relationship with a primary care physician if you do not have one) for your aftercare needs so that they can reassess your need for medications and monitor your lab values.  Please request your Prim.MD to go over all Hospital Tests and Procedure/Radiological results at the follow up, please get all Hospital records sent to your Prim MD by signing hospital release before you go home.  Get CBC, CMP, 2 view Chest X ray checked  by Primary MD during your  next visit or SNF MD in 5-7 days ( we routinely change or add medications that can affect your baseline labs and fluid status, therefore we recommend that you get the mentioned basic workup next visit with your PCP, your PCP may decide not to get them or add new tests based on their clinical decision)  On your next visit with your primary care physician please Get Medicines reviewed and adjusted.  If you experience worsening of your admission symptoms, develop shortness of breath, life threatening emergency, suicidal or homicidal thoughts you must seek medical attention immediately by calling 911 or calling your MD immediately  if symptoms less severe.  You Must read complete instructions/literature along with all the possible adverse reactions/side effects for all the Medicines you take and that have been prescribed to you. Take any new Medicines after you have completely understood and accpet all the possible adverse reactions/side effects.   Do not drive, operate heavy machinery, perform activities at heights, swimming or participation in water activities or provide baby sitting services if your were admitted for syncope or siezures until you have seen by Primary MD or a Neurologist and advised to do so again.  Do not drive when taking Pain medications.   Procedures/Studies: DG Chest Port 1 View Result Date: 11/04/2023 CLINICAL DATA:  [id number] SOB (shortness of breath) [id number] EXAM: PORTABLE CHEST - 1 VIEW COMPARISON:  09/05/2023. FINDINGS: Cardiac silhouette is unremarkable. No pneumothorax or pleural effusion. The lungs are clear. The visualized skeletal structures are unremarkable. IMPRESSION: No acute cardiopulmonary process. Electronically Signed   By: Fonda Field M.D.   On: 11/04/2023 19:38     The results of significant diagnostics from this hospitalization (including imaging, microbiology, ancillary and laboratory) are listed below for reference.     Microbiology: Recent Results  (from the past 240 hours)  Resp panel by RT-PCR (RSV, Flu A&B, Covid) Anterior Nasal Swab     Status: None   Collection Time: 11/04/23  9:56 PM   Specimen: Anterior Nasal Swab  Result Value Ref Range Status   SARS Coronavirus 2 by RT PCR NEGATIVE NEGATIVE Final    Comment: (NOTE) SARS-CoV-2 target nucleic acids are NOT DETECTED.  The SARS-CoV-2 RNA is generally detectable in upper respiratory specimens during the acute phase of infection. The lowest concentration of SARS-CoV-2 viral copies this assay can detect is 138 copies/mL. A negative result does not preclude SARS-Cov-2  infection and should not be used as the sole basis for treatment or other patient management decisions. A negative result may occur with  improper specimen collection/handling, submission of specimen other than nasopharyngeal swab, presence of viral mutation(s) within the areas targeted by this assay, and inadequate number of viral copies(<138 copies/mL). A negative result must be combined with clinical observations, patient history, and epidemiological information. The expected result is Negative.  Fact Sheet for Patients:  bloggercourse.com  Fact Sheet for Healthcare Providers:  seriousbroker.it  This test is no t yet approved or cleared by the [place]  FDA and  has been authorized for detection and/or diagnosis of SARS-CoV-2 by FDA under an Emergency Use Authorization (EUA). This EUA will remain  in effect (meaning this test can be used) for the duration of the COVID-19 declaration under Section 564(b)(1) of the Act, 21 U.S.C.section 360bbb-3(b)(1), unless the authorization is terminated  or revoked sooner.       Influenza A by PCR NEGATIVE NEGATIVE Final   Influenza B by PCR NEGATIVE NEGATIVE Final    Comment: (NOTE) The Xpert Xpress SARS-CoV-2/FLU/RSV plus assay is intended as an aid in the diagnosis of influenza from Nasopharyngeal swab  specimens and should not be used as a sole basis for treatment. Nasal washings and aspirates are unacceptable for Xpert Xpress SARS-CoV-2/FLU/RSV testing.  Fact Sheet for Patients: bloggercourse.com  Fact Sheet for Healthcare Providers: seriousbroker.it  This test is not yet approved or cleared by the [place]  FDA and has been authorized for detection and/or diagnosis of SARS-CoV-2 by FDA under an Emergency Use Authorization (EUA). This EUA will remain in effect (meaning this test can be used) for the duration of the COVID-19 declaration under Section 564(b)(1) of the Act, 21 U.S.C. section 360bbb-3(b)(1), unless the authorization is terminated or revoked.     Resp Syncytial Virus by PCR NEGATIVE NEGATIVE Final    Comment: (NOTE) Fact Sheet for Patients: bloggercourse.com  Fact Sheet for Healthcare Providers: seriousbroker.it  This test is not yet approved or cleared by the [place]  FDA and has been authorized for detection and/or diagnosis of SARS-CoV-2 by FDA under an Emergency Use Authorization (EUA). This EUA will remain in effect (meaning this test can be used) for the duration of the COVID-19 declaration under Section 564(b)(1) of the Act, 21 U.S.C. section 360bbb-3(b)(1), unless the authorization is terminated or revoked.  Performed at [facility], [address], [place], [place] [address]      Labs: BNP (last 3 results) Recent Labs    11/07/23 0718  BNP 67.1   Basic Metabolic Panel: Recent Labs  Lab 11/04/23 1851 11/05/23 0044 11/06/23 0532 11/07/23 0718 11/08/23 0508 11/09/23 0428  NA 141 139 141 140 141 142  K 3.9 3.9 4.0 4.4 4.6 3.6  CL 108  --  112* 108 109 109  CO2 23  --  22 25 27 26   GLUCOSE 98  --  106* 107* 123* 93  BUN 14  --  18 19 18 20   CREATININE 0.79  --  0.54 0.60 0.66 0.82  CALCIUM 8.9  --  8.7* 8.9  9.0 8.8*  MG  --   --   --  2.2 2.2 2.1  PHOS  --   --   --  4.3  --   --    Liver Function Tests: No results for input(s): AST, ALT, ALKPHOS, BILITOT, PROT, ALBUMIN in the last 168 hours. No results for input(s): LIPASE, AMYLASE in the last 168 hours.  No results for input(s): AMMONIA in the last 168 hours. CBC: Recent Labs  Lab 11/04/23 1851 11/05/23 0044 11/06/23 0532 11/07/23 0718 11/08/23 0508 11/09/23 0428  WBC 4.0  --  5.5 6.4 6.1 6.7  NEUTROABS 2.9  --   --   --   --   --   HGB 12.2 12.6 11.7* 12.1 11.8* 11.8*  HCT 36.7 37.0 36.5 37.5 35.6* 36.8  MCV 91.5  --  94.3 94.0 93.0 94.4  PLT 174  --  178 192 187 186   Cardiac Enzymes: No results for input(s): CKTOTAL, CKMB, CKMBINDEX, TROPONINI in the last 168 hours. BNP: Invalid input(s): POCBNP CBG: No results for input(s): GLUCAP in the last 168 hours. D-Dimer No results for input(s): DDIMER in the last 72 hours. Hgb A1c No results for input(s): HGBA1C in the last 72 hours. Lipid Profile No results for input(s): CHOL, HDL, LDLCALC, TRIG, CHOLHDL, LDLDIRECT in the last 72 hours. Thyroid function studies No results for input(s): TSH, T4TOTAL, T3FREE, THYROIDAB in the last 72 hours.  Invalid input(s): FREET3 Anemia work up No results for input(s): VITAMINB12, FOLATE, FERRITIN, TIBC, IRON, RETICCTPCT in the last 72 hours. Urinalysis    Component Value Date/Time   COLORURINE YELLOW 07/15/2019 1809   APPEARANCEUR HAZY (A) 07/15/2019 1809   LABSPEC 1.012 07/15/2019 1809   PHURINE 5.0 07/15/2019 1809   GLUCOSEU NEGATIVE 07/15/2019 1809   HGBUR NEGATIVE 07/15/2019 1809   BILIRUBINUR NEGATIVE 07/15/2019 1809   KETONESUR NEGATIVE 07/15/2019 1809   PROTEINUR NEGATIVE 07/15/2019 1809   NITRITE NEGATIVE 07/15/2019 1809   LEUKOCYTESUR MODERATE (A) 07/15/2019 1809   Sepsis Labs Recent Labs  Lab 11/06/23 0532 11/07/23 0718 11/08/23 0508 11/09/23 0428   WBC 5.5 6.4 6.1 6.7   Microbiology Recent Results (from the past 240 hours)  Resp panel by RT-PCR (RSV, Flu A&B, Covid) Anterior Nasal Swab     Status: None   Collection Time: 11/04/23  9:56 PM   Specimen: Anterior Nasal Swab  Result Value Ref Range Status   SARS Coronavirus 2 by RT PCR NEGATIVE NEGATIVE Final    Comment: (NOTE) SARS-CoV-2 target nucleic acids are NOT DETECTED.  The SARS-CoV-2 RNA is generally detectable in upper respiratory specimens during the acute phase of infection. The lowest concentration of SARS-CoV-2 viral copies this assay can detect is 138 copies/mL. A negative result does not preclude SARS-Cov-2 infection and should not be used as the sole basis for treatment or other patient management decisions. A negative result may occur with  improper specimen collection/handling, submission of specimen other than nasopharyngeal swab, presence of viral mutation(s) within the areas targeted by this assay, and inadequate number of viral copies(<138 copies/mL). A negative result must be combined with clinical observations, patient history, and epidemiological information. The expected result is Negative.  Fact Sheet for Patients:  bloggercourse.com  Fact Sheet for Healthcare Providers:  seriousbroker.it  This test is no t yet approved or cleared by the [place]  FDA and  has been authorized for detection and/or diagnosis of SARS-CoV-2 by FDA under an Emergency Use Authorization (EUA). This EUA will remain  in effect (meaning this test can be used) for the duration of the COVID-19 declaration under Section 564(b)(1) of the Act, 21 U.S.C.section 360bbb-3(b)(1), unless the authorization is terminated  or revoked sooner.       Influenza A by PCR NEGATIVE NEGATIVE Final   Influenza B by PCR NEGATIVE NEGATIVE Final    Comment: (NOTE) The Xpert Xpress SARS-CoV-2/FLU/RSV plus assay is  intended as an aid in the  diagnosis of influenza from Nasopharyngeal swab specimens and should not be used as a sole basis for treatment. Nasal washings and aspirates are unacceptable for Xpert Xpress SARS-CoV-2/FLU/RSV testing.  Fact Sheet for Patients: bloggercourse.com  Fact Sheet for Healthcare Providers: seriousbroker.it  This test is not yet approved or cleared by the [place]  FDA and has been authorized for detection and/or diagnosis of SARS-CoV-2 by FDA under an Emergency Use Authorization (EUA). This EUA will remain in effect (meaning this test can be used) for the duration of the COVID-19 declaration under Section 564(b)(1) of the Act, 21 U.S.C. section 360bbb-3(b)(1), unless the authorization is terminated or revoked.     Resp Syncytial Virus by PCR NEGATIVE NEGATIVE Final    Comment: (NOTE) Fact Sheet for Patients: bloggercourse.com  Fact Sheet for Healthcare Providers: seriousbroker.it  This test is not yet approved or cleared by the [place]  FDA and has been authorized for detection and/or diagnosis of SARS-CoV-2 by FDA under an Emergency Use Authorization (EUA). This EUA will remain in effect (meaning this test can be used) for the duration of the COVID-19 declaration under Section 564(b)(1) of the Act, 21 U.S.C. section 360bbb-3(b)(1), unless the authorization is terminated or revoked.  Performed at [facility], [address] [place], [place] [address]      Time coordinating discharge:  I have spent 35 minutes face to face with the patient and on the ward discussing the patients care, assessment, plan and disposition with other care givers. >50% of the time was devoted counseling the patient about the risks and benefits of treatment/Discharge disposition and coordinating care.   SIGNED:   Burgess JAYSON Dare, MD  [facility] 11/09/2023, 10:49  AM   If 7PM-7AM, please contact night-coverage

## 2023-11-11 ENCOUNTER — Ambulatory Visit: Payer: HMO | Admitting: Primary Care

## 2023-11-11 ENCOUNTER — Encounter: Payer: Self-pay | Admitting: Primary Care

## 2023-11-11 VITALS — BP 110/62 | HR 87 | Temp 97.7°F | Ht 61.0 in | Wt 127.4 lb

## 2023-11-11 DIAGNOSIS — J4489 Other specified chronic obstructive pulmonary disease: Secondary | ICD-10-CM | POA: Diagnosis not present

## 2023-11-11 MED ORDER — MONTELUKAST SODIUM 10 MG PO TABS: 10.0000 mg | ORAL_TABLET | Freq: Every day | ORAL | 11 refills | Status: DC

## 2023-11-11 MED ORDER — BREZTRI AEROSPHERE 160-9-4.8 MCG/ACT IN AERO: 2.0000 | INHALATION_SPRAY | Freq: Two times a day (BID) | RESPIRATORY_TRACT | Status: DC

## 2023-11-11 MED ORDER — GUAIFENESIN ER 600 MG PO TB12
600.0000 mg | ORAL_TABLET | Freq: Two times a day (BID) | ORAL | 1 refills | Status: AC | PRN
Start: 2023-11-11 — End: ?

## 2023-11-11 NOTE — Addendum Note (Signed)
 Addended byClyda Greener M on: 11/11/2023 04:18 PM   Modules accepted: Orders

## 2023-11-11 NOTE — Patient Instructions (Addendum)
-  COPD WITH ASTHMATIC COMPONENT: Chronic Obstructive Pulmonary Disease (COPD) with an asthmatic component means that your lungs have chronic inflammation and airflow obstruction, with some reversible aspects similar to asthma. We will change your inhaler to Breztri , which you will use twice daily, and provide a two-week sample for you to try. Additionally, we will start you on Singulair  to help reduce inflammation, but please be aware of recent research linking it to Alzheimer's. Continue taking Prednisone  as prescribed until November 17, 2023, and we will repeat your pulmonary function test to assess your lung function.  -PULMONARY NODULE: A pulmonary nodule is a small growth in the lung that is usually benign. We noted a small nodule in your right upper lung on a CT scan in September 2023. We plan to repeat the CT scan in March 2025 to monitor this nodule and ensure it remains benign.  -GENERAL HEALTH MAINTENANCE: For your general health, consider taking Mucinex  daily to help with congestion, and increase to twice daily if you notice more congestion. Please contact us  via Mychart message when you are nearing the end of your Breztri  sample so we can evaluate its effectiveness and potentially change your prescription.  Orders: PFTs first available   Follow-up 8 weeks with Dr. Brenna with PFTs prior

## 2023-11-11 NOTE — Progress Notes (Signed)
 @Patient  ID: Vicki Mcmillan, female    DOB: [birth year], 53 y.o.   MRN: [id number]  No chief complaint on file.   Referring provider: Teresa Aldona CROME, NP  HPI: 53 year old female, former smoker quit in 2023.  Past medical history significant for COPD/asthma, acute respiratory failure, lung nodule, GERD, sensory hearing loss, TMJ, depression/generalized anxiety disorder.  11/11/2023 Discussed the use of AI scribe software for clinical note transcription with the patient, who gave verbal consent to proceed.  The patient, with a history of COPD and a smoking history of about a pack a day for many years, presents for a hospital follow-up after two recent admissions. The first admission was in November and the second in January. The patient reports that prior to these admissions, her breathing seemed well-controlled. However, she began to experience coughing and production of clear sputum, followed by a noticeable change in her breathing. This led to her decision to seek hospital care.  She was most recently admitted from 11/04/2023 through 11/09/2023 for COPD exacerbation.  Chest x-ray on 11/04/2023 showed no acute cardiopulmonary process, lungs clear.  Wheezing was noted on exam.  COVID and flu testing were negative.  Started on prolonged prednisone  taper over the next 8 days.  Needs outpatient pulmonary function testing.   The patient was discharged on a regimen of prednisone  for eight days, which she started the day before this consultation. She also reports that she is currently on a triple therapy inhaler with Trelegy. Despite these treatments, the patient does not feel she has returned to her baseline health status.  The patient has a history of a lung biopsy showing granulomas and a small hiatal hernia. A LDCT scan in September 2024 showed moderate emphysema and a small pulmonary nodule in the right upper lobe, which was thought to be benign but need follow-up in 6 months (expected in March  2025).   The patient's last pulmonary function test was in 2022, which showed severe obstructive lung disease with pronounced reversibility in lung function, suggesting an asthmatic component to her COPD. The patient does not recall having asthma as a child.   Allergies  Allergen Reactions   Hydrocodone-Acetaminophen  Nausea And Vomiting   Tomato Rash   Bupropion Other (See Comments)    hallucinations   Oxycodone Nausea And Vomiting and Other (See Comments)    Stomach upset   Varenicline Other (See Comments)    hallucination    Immunization History  Administered Date(s) Administered   Influenza Inj Mdck Quad Pf 09/10/2021   Influenza Split 08/18/2017   Influenza,inj,Quad PF,6+ Mos 09/09/2018, 09/10/2021   Influenza-Unspecified 09/10/2020   PFIZER(Purple Top)SARS-COV-2 Vaccination 05/01/2020, 05/18/2020   Pneumococcal Polysaccharide-23 09/09/2018    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; cervical cancer   Complication of anesthesia    COPD (chronic obstructive pulmonary disease) (HCC)    Depression    GERD (gastroesophageal reflux disease)    Headache    Myocardial infarction (HCC)    Panic attacks    PONV (postoperative nausea and vomiting)     Tobacco History: Social History   Tobacco Use  Smoking Status Former   Current packs/day: 0.00   Average packs/day: 0.3 packs/day for 15.0 years (3.8 ttl pk-yrs)   Types: Cigarettes   Start date: 06/10/2007   Quit date: 06/09/2022   Years since quitting: 1.4  Smokeless Tobacco Never  Tobacco Comments  Quit smoking cigarettes 8/72023. Tay 07/14/2022   Counseling given: Not Answered Tobacco comments: Quit smoking cigarettes 8/72023. Tay 07/14/2022   Outpatient Medications Prior to Visit  Medication Sig Dispense Refill   albuterol  (VENTOLIN  HFA) 108 (90 Base) MCG/ACT inhaler Inhale 1-2 puffs into the lungs every 6 (six) hours as needed for wheezing or  shortness of breath. INHALE 1 TO 2 PUFFS INTO THE LUNGS EVERY 6 HOURS AS NEEDED FOR WHEEZING OR SHORTNESS OF BREATH 18 g 1   ciclopirox (PENLAC) 8 % solution Apply topically See admin instructions. Apply topically at bedtime. Apply daily on nail and surrounding skin over previous coat. After 7 days remove with alcohol and repeat cycle.     ibuprofen  (ADVIL ) 200 MG tablet Take 200 mg by mouth every 6 (six) hours as needed for headache.     ipratropium-albuterol  (DUONEB) 0.5-2.5 (3) MG/3ML SOLN Take 3 mLs by nebulization 2 (two) times daily as needed (asthma). 90 mL 2   loratadine  (CLARITIN ) 10 MG tablet Take 10 mg by mouth daily.     pantoprazole  (PROTONIX ) 40 MG tablet Take 1 tablet (40 mg total) by mouth daily. (Patient taking differently: Take 80 mg by mouth at bedtime.) 30 tablet 1   predniSONE  (DELTASONE ) 20 MG tablet Take 2 tablets (40 mg total) by mouth daily with breakfast for 2 days, THEN 1.5 tablets (30 mg total) daily with breakfast for 2 days, THEN 1 tablet (20 mg total) daily with breakfast for 2 days, THEN 0.5 tablets (10 mg total) daily with breakfast for 2 days. 10 tablet 0   prochlorperazine (COMPAZINE) 5 MG tablet Take 5 mg by mouth every 8 (eight) hours as needed for vomiting or nausea.     rizatriptan  (MAXALT -MLT) 5 MG disintegrating tablet Take 5 mg by mouth as needed for migraine.     sertraline  (ZOLOFT ) 100 MG tablet Take 1.5 tablets (150 mg total) by mouth daily. 45 tablet 2   topiramate  (TOPAMAX ) 50 MG tablet Take 50 mg by mouth at bedtime.     TRELEGY ELLIPTA  200-62.5-25 MCG/ACT AEPB INHALE 1 PUFF EVERY DAY 180 each 3   No facility-administered medications prior to visit.   Review of Systems  Review of Systems  Constitutional: Negative.   HENT: Negative.    Respiratory:  Positive for wheezing.   Cardiovascular: Negative.    Physical Exam  There were no vitals taken for this visit. Physical Exam Constitutional:      General: She is not in acute distress.     Appearance: Normal appearance. She is not ill-appearing.  HENT:     Head: Normocephalic and atraumatic.  Cardiovascular:     Rate and Rhythm: Normal rate and regular rhythm.  Pulmonary:     Effort: Pulmonary effort is normal.     Breath sounds: Wheezing present. No rhonchi.  Musculoskeletal:        General: Normal range of motion.  Skin:    General: Skin is warm and dry.  Neurological:     General: No focal deficit present.     Mental Status: She is alert and oriented to person, place, and time. Mental status is at baseline.  Psychiatric:        Mood and Affect: Mood normal.        Behavior: Behavior normal.        Thought Content: Thought content normal.        Judgment: Judgment normal.      Lab Results:  CBC    Component Value  Date/Time   WBC 6.7 11/09/2023 0428   RBC 3.90 11/09/2023 0428   HGB 11.8 (L) 11/09/2023 0428   HCT 36.8 11/09/2023 0428   PLT 186 11/09/2023 0428   MCV 94.4 11/09/2023 0428   MCH 30.3 11/09/2023 0428   MCHC 32.1 11/09/2023 0428   RDW 14.6 11/09/2023 0428   LYMPHSABS 0.8 11/04/2023 1851   MONOABS 0.3 11/04/2023 1851   EOSABS 0.0 11/04/2023 1851   BASOSABS 0.0 11/04/2023 1851    BMET    Component Value Date/Time   NA 142 11/09/2023 0428   K 3.6 11/09/2023 0428   CL 109 11/09/2023 0428   CO2 26 11/09/2023 0428   GLUCOSE 93 11/09/2023 0428   BUN 20 11/09/2023 0428   CREATININE 0.82 11/09/2023 0428   CALCIUM 8.8 (L) 11/09/2023 0428   GFRNONAA >60 11/09/2023 0428   GFRAA >60 07/15/2019 1808    BNP    Component Value Date/Time   BNP 67.1 11/07/2023 0718    ProBNP No results found for: PROBNP  Imaging: DG Chest Port 1 View Result Date: 11/04/2023 CLINICAL DATA:  141880 SOB (shortness of breath) 141880 EXAM: PORTABLE CHEST - 1 VIEW COMPARISON:  09/05/2023. FINDINGS: Cardiac silhouette is unremarkable. No pneumothorax or pleural effusion. The lungs are clear. The visualized skeletal structures are unremarkable. IMPRESSION: No  acute cardiopulmonary process. Electronically Signed   By: Fonda Field M.D.   On: 11/04/2023 19:38     Assessment & Plan:   1. COPD (chronic obstructive pulmonary disease) with chronic bronchitis (HCC) (Primary) - Pulmonary function test; Future      COPD with Asthmatic Component Two hospitalizations for COPD exacerbation in the last 3 months. CXR's were clear without acute process. Current therapy with Trelegy 200mcg and prednisone  taper. Noted to have wheezing on exam. -Change inhaler to Breztri  twice daily, provide a two-week sample for trial. -Start Singulair  to decrease inflammation, with caution regarding recent research linking it to Alzheimer's. -Start Mucinex  daily, increasing to twice daily if increased congestion is noted. -Continue Prednisone  as prescribed until 11/17/2023. -Repeat pulmonary function test. -Contact provider via M[employer] message when nearing the end of the Breztri  sample to evaluate effectiveness and potentially change prescription.  -If still having flare ups consider adding PDE4 inhibitor or biologic therapy such as Dupixent or Tezspire   Pulmonary Nodule Small pulmonary nodule noted on CT scan in September 2023, with recommendation for repeat scan in six months. -Plan for repeat CT scan in March 2025 to monitor nodule.     Almarie LELON Ferrari, NP 11/11/2023

## 2023-11-17 ENCOUNTER — Ambulatory Visit: Payer: HMO | Admitting: Gastroenterology

## 2023-11-17 ENCOUNTER — Encounter: Payer: Self-pay | Admitting: Gastroenterology

## 2023-11-17 VITALS — BP 110/62 | HR 98 | Ht 61.0 in | Wt 125.0 lb

## 2023-11-17 DIAGNOSIS — K449 Diaphragmatic hernia without obstruction or gangrene: Secondary | ICD-10-CM | POA: Diagnosis not present

## 2023-11-17 DIAGNOSIS — K2100 Gastro-esophageal reflux disease with esophagitis, without bleeding: Secondary | ICD-10-CM | POA: Diagnosis not present

## 2023-11-17 DIAGNOSIS — J449 Chronic obstructive pulmonary disease, unspecified: Secondary | ICD-10-CM | POA: Diagnosis not present

## 2023-11-17 DIAGNOSIS — Z87891 Personal history of nicotine dependence: Secondary | ICD-10-CM

## 2023-11-17 DIAGNOSIS — R1319 Other dysphagia: Secondary | ICD-10-CM

## 2023-11-17 DIAGNOSIS — K5909 Other constipation: Secondary | ICD-10-CM

## 2023-11-17 DIAGNOSIS — K5900 Constipation, unspecified: Secondary | ICD-10-CM

## 2023-11-17 MED ORDER — PANTOPRAZOLE SODIUM 40 MG PO TBEC: 40.0000 mg | DELAYED_RELEASE_TABLET | Freq: Every day | ORAL | 11 refills | Status: DC

## 2023-11-17 NOTE — Progress Notes (Signed)
 Chief Complaint: med refill Primary GI MD: Dr. San  HPI: 53 year old female history of GERD, hiatal hernia, dysphagia, COPD with emphysema, tobacco use disorder (quit 06/2022), asthma, anxiety, presents for evaluation of med refill.  Last seen October 2023 by Dr. San. At that time she was having GERD, dysphagia, and constipation.  She was set up for EGD and colonoscopy (see details below) which showed esophagitis, hiatal hernia, patient had dilation with 17 mm Salvary dilator and was recommended to take MiraLAX for constipation.  Of note, recent hospitalization November 2024 and November 04, 2023 for COPD exacerbations.  She is on the last day of her prednisone  today with improvement.  Labs drawn during admission were unremarkable besides slightly low calcium at 8.8   Discussed the use of AI scribe software for clinical note transcription with the patient, who gave verbal consent to proceed.  Since her EGD and being on Protonix  40 Mg once daily she has had resolution of symptoms.  Has adequate control of her GERD and no further dysphagia.  In 2023, the patient underwent an endoscopy which revealed esophagitis and a small hiatal hernia. The esophagus was dilated during this procedure. Since then, the patient has reported feeling much better.  The patient also has a history of constipation, which she reports has been a lifelong issue. She does not take Miralax, but has recently started Benefiber, which has improved her bowel movements to every other day.  Lastly, the patient had a colonoscopy in 2023 during which a few polyps were found. The next colonoscopy is scheduled for 2033 unless symptoms change.       PREVIOUS GI WORKUP   EGD 08/11/2022 for GERD and dysphagia - LA Grade A reflux esophagitis with no bleeding.  - Normal upper third of esophagus and middle third of esophagus. Dilated with 17 mm Savary dilator.  - Gastroesophageal flap valve classified as Hill Grade III (  minimal fold, loose to endoscope, hiatal hernia likely) .  - < 1 cm, sliding type hiatal hernia.  - Normal stomach. Biopsied.  - Normal examined duodenum.  Colonoscopy 08/11/2022 for screening constipation - The entire examined colon is normal.  - Two 2 to 3 mm polyps in the rectum, removed with a cold snare. Resected and retrieved.  - The distal rectum and anal verge are normal on retroflexion view. -Recall 2033  1. Surgical [P], gastric - GASTRIC ANTRAL AND OXYNTIC MUCOSA WITH REACTIVE GASTROPATHY - NEGATIVE FOR H.PYLORI ON H&E STAIN - NEGATIVE FOR INTESTINAL METAPLASIA OR MALIGNANCY 2. Surgical [P], colon, rectum, polyp (2) -HYPERPLASTIC POLYP(S) - NO DYSPLASIA OR MALIGNANCY  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; cervical cancer   Complication of anesthesia    COPD (chronic obstructive pulmonary disease) (HCC)    Depression    GERD (gastroesophageal reflux disease)    Headache    Myocardial infarction (HCC)    Panic attacks    PONV (postoperative nausea and vomiting)     Past Surgical History:  Procedure Laterality Date   BRONCHIAL BIOPSY  03/25/2022   Procedure: BRONCHIAL BIOPSIES;  Surgeon: Brenna Adine CROME, DO;  Location: [facility];  Service: Pulmonary;;   BRONCHIAL BRUSHINGS  03/25/2022   Procedure: BRONCHIAL BRUSHINGS;  Surgeon: Brenna Adine CROME, DO;  Location: [facility];  Service: Pulmonary;;   BRONCHIAL NEEDLE ASPIRATION BIOPSY  03/25/2022   Procedure: BRONCHIAL NEEDLE ASPIRATION BIOPSIES;  Surgeon: Brenna,  Adine CROME, DO;  Location: [facility];  Service: Pulmonary;;   COLONOSCOPY WITH ESOPHAGOGASTRODUODENOSCOPY (EGD)  08/11/2022   TUBAL LIGATION     VIDEO BRONCHOSCOPY WITH RADIAL ENDOBRONCHIAL ULTRASOUND  03/25/2022   Procedure: RADIAL ENDOBRONCHIAL ULTRASOUND;  Surgeon: Brenna Adine CROME, DO;  Location: [facility];  Service: Pulmonary;;   WRIST SURGERY      Current Outpatient  Medications  Medication Sig Dispense Refill   albuterol  (VENTOLIN  HFA) 108 (90 Base) MCG/ACT inhaler Inhale 1-2 puffs into the lungs every 6 (six) hours as needed for wheezing or shortness of breath. INHALE 1 TO 2 PUFFS INTO THE LUNGS EVERY 6 HOURS AS NEEDED FOR WHEEZING OR SHORTNESS OF BREATH 18 g 1   Budeson-Glycopyrrol-Formoterol (BREZTRI  AEROSPHERE) 160-9-4.8 MCG/ACT AERO Inhale 2 puffs into the lungs in the morning and at bedtime.     ciclopirox (PENLAC) 8 % solution Apply topically See admin instructions. Apply topically at bedtime. Apply daily on nail and surrounding skin over previous coat. After 7 days remove with alcohol and repeat cycle.     guaiFENesin  (MUCINEX ) 600 MG 12 hr tablet Take 1 tablet (600 mg total) by mouth 2 (two) times daily as needed for to loosen phlegm. 30 tablet 1   ibuprofen  (ADVIL ) 200 MG tablet Take 200 mg by mouth every 6 (six) hours as needed for headache.     loratadine  (CLARITIN ) 10 MG tablet Take 10 mg by mouth daily.     montelukast  (SINGULAIR ) 10 MG tablet Take 1 tablet (10 mg total) by mouth at bedtime. 30 tablet 11   predniSONE  (DELTASONE ) 20 MG tablet Take 2 tablets (40 mg total) by mouth daily with breakfast for 2 days, THEN 1.5 tablets (30 mg total) daily with breakfast for 2 days, THEN 1 tablet (20 mg total) daily with breakfast for 2 days, THEN 0.5 tablets (10 mg total) daily with breakfast for 2 days. 10 tablet 0   prochlorperazine (COMPAZINE) 5 MG tablet Take 5 mg by mouth every 8 (eight) hours as needed for vomiting or nausea.     rizatriptan  (MAXALT -MLT) 5 MG disintegrating tablet Take 5 mg by mouth as needed for migraine.     sertraline  (ZOLOFT ) 100 MG tablet Take 1.5 tablets (150 mg total) by mouth daily. 45 tablet 2   topiramate  (TOPAMAX ) 50 MG tablet Take 50 mg by mouth at bedtime.     TRELEGY ELLIPTA  200-62.5-25 MCG/ACT AEPB INHALE 1 PUFF EVERY DAY 180 each 3   ipratropium-albuterol  (DUONEB) 0.5-2.5 (3) MG/3ML SOLN Take 3 mLs by nebulization 2  (two) times daily as needed (asthma). 90 mL 2   pantoprazole  (PROTONIX ) 40 MG tablet Take 1 tablet (40 mg total) by mouth daily. 30 tablet 11   No current facility-administered medications for this visit.    Allergies as of 11/17/2023 - Review Complete 11/17/2023  Allergen Reaction Noted   Hydrocodone-acetaminophen  Nausea And Vomiting 05/15/2015   Tomato Rash 12/24/2018   Bupropion Other (See Comments) 05/11/2016   Oxycodone Nausea And Vomiting and Other (See Comments) 07/27/2017   Varenicline Other (See Comments) 05/11/2016    Family History  Problem Relation Age of Onset   COPD Father    COPD Sister        Died in her 46s from it.   COPD Maternal Aunt    Colon cancer Neg Hx    Rectal cancer Neg Hx    Stomach cancer Neg Hx    Esophageal cancer Neg Hx     Social History   Socioeconomic History  Marital status: Legally Separated    Spouse name: Not on file   Number of children: 1   Years of education: Not on file   Highest education level: Not on file  Occupational History   Not on file  Tobacco Use   Smoking status: Former    Current packs/day: 0.50    Average packs/day: 0.3 packs/day for 15.2 years (3.9 ttl pk-yrs)    Types: Cigarettes    Start date: 06/10/2007    Quit date: 06/09/2022   Smokeless tobacco: Never   Tobacco comments:    Quit smoking cigarettes.  Quit smoking November 2024.  Vaping Use   Vaping status: Former  Substance and Sexual Activity   Alcohol use: No   Drug use: No   Sexual activity: Yes    Birth control/protection: None  Other Topics Concern   Not on file  Social History Narrative   Not on file   Social Drivers of Health   Financial Resource Strain: Low Risk  (11/10/2023)   Received from [facility]   Overall Financial Resource Strain (CARDIA)    Difficulty of Paying Living Expenses: Not hard at all  Food Insecurity: No Food Insecurity (11/10/2023)   Received from [facility]   Hunger Vital Sign    Worried About Running Out of  Food in the Last Year: Never true    Ran Out of Food in the Last Year: Never true  Transportation Needs: No Transportation Needs (11/10/2023)   Received from [facility] - Transportation    Lack of Transportation (Medical): No    Lack of Transportation (Non-Medical): No  Physical Activity: Insufficiently Active (10/10/2023)   Received from [facility]   Exercise Vital Sign    Days of Exercise per Week: 2 days    Minutes of Exercise per Session: 30 min  Stress: No Stress Concern Present (10/10/2023)   Received from [facility] - Occupational Stress Questionnaire    Feeling of Stress : Not at all  Social Connections: Socially Integrated (10/10/2023)   Received from [facility]   Social Network    How would you rate your social network (family, work, friends)?: Good participation with social networks  Intimate Partner Violence: Not At Risk (11/06/2023)   Humiliation, Afraid, Rape, and Kick questionnaire    Fear of Current or Ex-Partner: No    Emotionally Abused: No    Physically Abused: No    Sexually Abused: No    Review of Systems:    Constitutional: No weight loss, fever, chills, weakness or fatigue HEENT: Eyes: No change in vision               Ears, Nose, Throat:  No change in hearing or congestion Skin: No rash or itching Cardiovascular: No chest pain, chest pressure or palpitations   Respiratory: No SOB or cough Gastrointestinal: See HPI and otherwise negative Genitourinary: No dysuria or change in urinary frequency Neurological: No headache, dizziness or syncope Musculoskeletal: No new muscle or joint pain Hematologic: No bleeding or bruising Psychiatric: No history of depression or anxiety    Physical Exam:  Vital signs: BP 110/62   Pulse 98   Ht 5' 1 (1.549 m)   Wt 125 lb (56.7 kg)   BMI 23.62 kg/m   Constitutional: NAD, Well developed, Well nourished, alert and cooperative Head:  Normocephalic and  atraumatic. Eyes:   PEERL, EOMI. No icterus. Conjunctiva pink. Respiratory: Respirations even and unlabored. Lungs clear  but somewhat diminished to auscultation bilaterally.   No wheezes, crackles, or rhonchi.  Cardiovascular:  Regular rate and rhythm. No peripheral edema, cyanosis or pallor.  Gastrointestinal:  Soft, nondistended, nontender. No rebound or guarding. Normal bowel sounds. No appreciable masses or hepatomegaly. Rectal:  Not performed.  Msk:  Symmetrical without gross deformities. Without edema, no deformity or joint abnormality.  Neurologic:  Alert and  oriented x4;  grossly normal neurologically.  Skin:   Dry and intact without significant lesions or rashes. Psychiatric: Oriented to person, place and time. Demonstrates good judgement and reason without abnormal affect or behaviors.   RELEVANT LABS AND IMAGING: CBC    Component Value Date/Time   WBC 6.7 11/09/2023 0428   RBC 3.90 11/09/2023 0428   HGB 11.8 (L) 11/09/2023 0428   HCT 36.8 11/09/2023 0428   PLT 186 11/09/2023 0428   MCV 94.4 11/09/2023 0428   MCH 30.3 11/09/2023 0428   MCHC 32.1 11/09/2023 0428   RDW 14.6 11/09/2023 0428   LYMPHSABS 0.8 11/04/2023 1851   MONOABS 0.3 11/04/2023 1851   EOSABS 0.0 11/04/2023 1851   BASOSABS 0.0 11/04/2023 1851    CMP     Component Value Date/Time   NA 142 11/09/2023 0428   K 3.6 11/09/2023 0428   CL 109 11/09/2023 0428   CO2 26 11/09/2023 0428   GLUCOSE 93 11/09/2023 0428   BUN 20 11/09/2023 0428   CREATININE 0.82 11/09/2023 0428   CALCIUM 8.8 (L) 11/09/2023 0428   PROT 7.2 07/15/2019 1808   ALBUMIN 4.2 07/15/2019 1808   AST 20 07/15/2019 1808   ALT 18 07/15/2019 1808   ALKPHOS 78 07/15/2019 1808   BILITOT 1.1 07/15/2019 1808   GFRNONAA >60 11/09/2023 0428   GFRAA >60 07/15/2019 1808     Assessment/Plan:      GERD/Esophagitis/Hiatal Hernia Good symptom control with Protonix  once daily. History of esophageal dilation and small hiatal hernia. --  Continue Protonix  once daily. Refill for one year. - Follow-up 1 year - Advised to side effects of Protonix .  Continue to monitor vitamins and kidney function  Constipation Chronic issue, improved with Benefiber. -Continue Benefiber as needed.  COPD Recent hospitalization with improvement on prednisone . -Continue current management and monitor symptoms.  General Health Maintenance -Next colonoscopy due in October 2023. -Return visit in one year unless symptoms change.      Nestor Mollie RIGGERS [facility] 11/17/2023, 9:34 AM  Cc: Teresa Aldona CROME, NP

## 2023-11-17 NOTE — Patient Instructions (Signed)
 We have sent the following medications to your pharmacy for you to pick up at your convenience: Pantoprazole     Follow-up 1 year or sooner , if needed.   _______________________________________________________  If your blood pressure at your visit was 140/90 or greater, please contact your primary care physician to follow up on this.  _______________________________________________________  If you are age 54 or older, your body mass index should be between 23-30. Your Body mass index is 23.62 kg/m. If this is out of the aforementioned range listed, please consider follow up with your Primary Care Provider.  If you are age 58 or younger, your body mass index should be between 19-25. Your Body mass index is 23.62 kg/m. If this is out of the aformentioned range listed, please consider follow up with your Primary Care Provider.   ________________________________________________________  The [facility] providers would like to encourage you to use MYCHART to communicate with providers for non-urgent requests or questions.  Due to long hold times on the telephone, sending your provider a message by [facility] may be a faster and more efficient way to get a response.  Please allow 48 business hours for a response.  Please remember that this is for non-urgent requests.  _______________________________________________________  Thank you for choosing me and [facility].  Thank you,  Nestor Blower, PA-C

## 2023-11-26 ENCOUNTER — Telehealth: Payer: Self-pay | Admitting: Primary Care

## 2023-11-26 NOTE — Telephone Encounter (Signed)
PT sent the following message to [facility] and has not heard back. Adv that [facility] takes awhile to reach provider. Please see note below and contact pt to advise. TY.  Hi Ms. Clent Ridges the [employer] inhaler you gave me seems to be working good. Can you please call me in a prescription at [facility] in [place]. I did want to note that I do seem to have a sinus problem (not sure if an infection) I did reach out to my primary care and they suggested to reach out to my pulmonary doctor because I was taking prednisone (finished) and an antibiotic (finished) not sure if this is something to be concerned about because I don't want to end up back in hospital thank you Vicki Mcmillan

## 2023-11-29 NOTE — Progress Notes (Signed)
Agree with the assessment and plan as outlined by Boone Master, PA-C, with edit that next colonoscopy not due until 2033.  Alichia Alridge, DO, [facility]

## 2023-11-30 MED ORDER — BREZTRI AEROSPHERE 160-9-4.8 MCG/ACT IN AERO: 2.0000 | INHALATION_SPRAY | Freq: Two times a day (BID) | RESPIRATORY_TRACT | 11 refills | Status: DC

## 2023-11-30 NOTE — Telephone Encounter (Signed)
I have responded via [employer] as this is a duplicate encounter

## 2023-12-10 ENCOUNTER — Encounter (HOSPITAL_COMMUNITY): Payer: Self-pay | Admitting: Psychiatry

## 2023-12-10 ENCOUNTER — Telehealth (HOSPITAL_COMMUNITY): Payer: HMO | Admitting: Psychiatry

## 2023-12-10 VITALS — Wt 127.0 lb

## 2023-12-10 DIAGNOSIS — F331 Major depressive disorder, recurrent, moderate: Secondary | ICD-10-CM | POA: Diagnosis not present

## 2023-12-10 DIAGNOSIS — F419 Anxiety disorder, unspecified: Secondary | ICD-10-CM | POA: Diagnosis not present

## 2023-12-10 MED ORDER — SERTRALINE HCL 100 MG PO TABS: 150.0000 mg | ORAL_TABLET | Freq: Every day | ORAL | 2 refills | Status: DC

## 2023-12-10 NOTE — Progress Notes (Signed)
 [facility] MD Virtual Progress Note   Patient Location: Home Provider Location: Office  I connect with patient by video and verified that I am speaking with correct person by using two identifiers. I discussed the limitations of evaluation and management by telemedicine and the availability of in person appointments. I also discussed with the patient that there may be a patient responsible charge related to this service. The patient expressed understanding and agreed to proceed.  Vicki Mcmillan [id number] 53 y.o.  12/10/2023 2:45 PM  History of Present Illness:  Patient was evaluated by video session.  She was again admitted due to exacerbation of COPD on new received and admitted in the hospital for 1 week.  She feels very tired, fatigue and lack of energy and motivation to do things.  She is not sure if her chronic illness, lack of immunity or something else is causing so fatigue.  She feels more physical rather than mental fatigue.  She denies any crying spells or any feeling of hopelessness or worthlessness.  Her appetite is fair.  She does not go outside unless important or necessary.  She is taking Topamax  prescribed by neurology for her headaches.  She is now taking 50 mg twice a day.  She had taking the Zoloft  regularly and reported no major concerns or side effects.  She denies any crying spells or any feeling of hopelessness or worthlessness.  She enjoys the company of her 69-year-old grandnephew.  Patient like to keep the current dose of Zoloft .  Past Psychiatric History: No h/o suicidal attempt or inpatient treatment.  Saw psychiatrist after Chantix caused increased anxiety, hallucination and memory impairment.  Saw Dr. Gracia at [facility] and given Zoloft  Abilify with good response.  PCP prescribed Xanax . Took Klonopin  for a while.    Outpatient Encounter Medications as of 12/10/2023  Medication Sig   albuterol  (VENTOLIN  HFA) 108 (90 Base) MCG/ACT inhaler Inhale 1-2  puffs into the lungs every 6 (six) hours as needed for wheezing or shortness of breath. INHALE 1 TO 2 PUFFS INTO THE LUNGS EVERY 6 HOURS AS NEEDED FOR WHEEZING OR SHORTNESS OF BREATH   Budeson-Glycopyrrol-Formoterol (BREZTRI  AEROSPHERE) 160-9-4.8 MCG/ACT AERO Inhale 2 puffs into the lungs in the morning and at bedtime.   Budeson-Glycopyrrol-Formoterol (BREZTRI  AEROSPHERE) 160-9-4.8 MCG/ACT AERO Inhale 2 puffs into the lungs in the morning and at bedtime.   ciclopirox (PENLAC) 8 % solution Apply topically See admin instructions. Apply topically at bedtime. Apply daily on nail and surrounding skin over previous coat. After 7 days remove with alcohol and repeat cycle.   guaiFENesin  (MUCINEX ) 600 MG 12 hr tablet Take 1 tablet (600 mg total) by mouth 2 (two) times daily as needed for to loosen phlegm.   ibuprofen  (ADVIL ) 200 MG tablet Take 200 mg by mouth every 6 (six) hours as needed for headache.   ipratropium-albuterol  (DUONEB) 0.5-2.5 (3) MG/3ML SOLN Take 3 mLs by nebulization 2 (two) times daily as needed (asthma).   loratadine  (CLARITIN ) 10 MG tablet Take 10 mg by mouth daily.   montelukast  (SINGULAIR ) 10 MG tablet Take 1 tablet (10 mg total) by mouth at bedtime.   pantoprazole  (PROTONIX ) 40 MG tablet Take 1 tablet (40 mg total) by mouth daily.   prochlorperazine (COMPAZINE) 5 MG tablet Take 5 mg by mouth every 8 (eight) hours as needed for vomiting or nausea.   rizatriptan  (MAXALT -MLT) 5 MG disintegrating tablet Take 5 mg by mouth as needed for migraine.   sertraline  (ZOLOFT ) 100 MG tablet  Take 1.5 tablets (150 mg total) by mouth daily.   topiramate  (TOPAMAX ) 50 MG tablet Take 50 mg by mouth at bedtime.   TRELEGY ELLIPTA  200-62.5-25 MCG/ACT AEPB INHALE 1 PUFF EVERY DAY   No facility-administered encounter medications on file as of 12/10/2023.    Recent Results (from the past 2160 hours)  Basic metabolic panel     Status: None   Collection Time: 11/04/23  6:51 PM  Result Value Ref Range    Sodium 141 135 - 145 mmol/L   Potassium 3.9 3.5 - 5.1 mmol/L   Chloride 108 98 - 111 mmol/L   CO2 23 22 - 32 mmol/L   Glucose, Bld 98 70 - 99 mg/dL    Comment: Glucose reference range applies only to samples taken after fasting for at least 8 hours.   BUN 14 6 - 20 mg/dL   Creatinine, Ser 9.20 0.44 - 1.00 mg/dL   Calcium 8.9 8.9 - 89.6 mg/dL   GFR, Estimated >39 >39 mL/min    Comment: (NOTE) Calculated using the CKD-EPI Creatinine Equation (2021)    Anion gap 10 5 - 15    Comment: Performed at [facility], [address] [place], [place] [address]  CBC with Differential     Status: None   Collection Time: 11/04/23  6:51 PM  Result Value Ref Range   WBC 4.0 4.0 - 10.5 K/uL   RBC 4.01 3.87 - 5.11 MIL/uL   Hemoglobin 12.2 12.0 - 15.0 g/dL   HCT 63.2 63.9 - 53.9 %   MCV 91.5 80.0 - 100.0 fL   MCH 30.4 26.0 - 34.0 pg   MCHC 33.2 30.0 - 36.0 g/dL   RDW 85.2 88.4 - 84.4 %   Platelets 174 150 - 400 K/uL   nRBC 0.0 0.0 - 0.2 %   Neutrophils Relative % 72 %   Neutro Abs 2.9 1.7 - 7.7 K/uL   Lymphocytes Relative 20 %   Lymphs Abs 0.8 0.7 - 4.0 K/uL   Monocytes Relative 7 %   Monocytes Absolute 0.3 0.1 - 1.0 K/uL   Eosinophils Relative 1 %   Eosinophils Absolute 0.0 0.0 - 0.5 K/uL   Basophils Relative 0 %   Basophils Absolute 0.0 0.0 - 0.1 K/uL   Immature Granulocytes 0 %   Abs Immature Granulocytes 0.01 0.00 - 0.07 K/uL    Comment: Performed at [facility], [address], [place], [place] [address]  Resp panel by RT-PCR (RSV, Flu A&B, Covid) Anterior Nasal Swab     Status: None   Collection Time: 11/04/23  9:56 PM   Specimen: Anterior Nasal Swab  Result Value Ref Range   SARS Coronavirus 2 by RT PCR NEGATIVE NEGATIVE    Comment: (NOTE) SARS-CoV-2 target nucleic acids are NOT DETECTED.  The SARS-CoV-2 RNA is generally detectable in upper respiratory specimens during the acute phase of infection. The lowest concentration of SARS-CoV-2  viral copies this assay can detect is 138 copies/mL. A negative result does not preclude SARS-Cov-2 infection and should not be used as the sole basis for treatment or other patient management decisions. A negative result may occur with  improper specimen collection/handling, submission of specimen other than nasopharyngeal swab, presence of viral mutation(s) within the areas targeted by this assay, and inadequate number of viral copies(<138 copies/mL). A negative result must be combined with clinical observations, patient history, and epidemiological information. The expected result is Negative.  Fact Sheet for Patients:  bloggercourse.com  Fact Sheet for  Healthcare Providers:  seriousbroker.it  This test is no t yet approved or cleared by the [place]  FDA and  has been authorized for detection and/or diagnosis of SARS-CoV-2 by FDA under an Emergency Use Authorization (EUA). This EUA will remain  in effect (meaning this test can be used) for the duration of the COVID-19 declaration under Section 564(b)(1) of the Act, 21 U.S.C.section 360bbb-3(b)(1), unless the authorization is terminated  or revoked sooner.       Influenza A by PCR NEGATIVE NEGATIVE   Influenza B by PCR NEGATIVE NEGATIVE    Comment: (NOTE) The Xpert Xpress SARS-CoV-2/FLU/RSV plus assay is intended as an aid in the diagnosis of influenza from Nasopharyngeal swab specimens and should not be used as a sole basis for treatment. Nasal washings and aspirates are unacceptable for Xpert Xpress SARS-CoV-2/FLU/RSV testing.  Fact Sheet for Patients: bloggercourse.com  Fact Sheet for Healthcare Providers: seriousbroker.it  This test is not yet approved or cleared by the [place]  FDA and has been authorized for detection and/or diagnosis of SARS-CoV-2 by FDA under an Emergency Use Authorization (EUA). This EUA  will remain in effect (meaning this test can be used) for the duration of the COVID-19 declaration under Section 564(b)(1) of the Act, 21 U.S.C. section 360bbb-3(b)(1), unless the authorization is terminated or revoked.     Resp Syncytial Virus by PCR NEGATIVE NEGATIVE    Comment: (NOTE) Fact Sheet for Patients: bloggercourse.com  Fact Sheet for Healthcare Providers: seriousbroker.it  This test is not yet approved or cleared by the [place]  FDA and has been authorized for detection and/or diagnosis of SARS-CoV-2 by FDA under an Emergency Use Authorization (EUA). This EUA will remain in effect (meaning this test can be used) for the duration of the COVID-19 declaration under Section 564(b)(1) of the Act, 21 U.S.C. section 360bbb-3(b)(1), unless the authorization is terminated or revoked.  Performed at [facility], [address], [place], [place] [address]   I-Stat venous blood gas, [facility], [facility], [facility])     Status: Abnormal   Collection Time: 11/05/23 12:44 AM  Result Value Ref Range   pH, Ven 7.324 7.25 - 7.43   pCO2, Ven 38.7 (L) 44 - 60 mmHg   pO2, Ven 46 (H) 32 - 45 mmHg   Bicarbonate 20.1 20.0 - 28.0 mmol/L   TCO2 21 (L) 22 - 32 mmol/L   O2 Saturation 78 %   Acid-base deficit 5.0 (H) 0.0 - 2.0 mmol/L   Sodium 139 135 - 145 mmol/L   Potassium 3.9 3.5 - 5.1 mmol/L   Calcium, Ion 1.27 1.15 - 1.40 mmol/L   HCT 37.0 36.0 - 46.0 %   Hemoglobin 12.6 12.0 - 15.0 g/dL   Collection site IV start    Drawn by Nurse    Sample type VENOUS   Basic metabolic panel     Status: Abnormal   Collection Time: 11/06/23  5:32 AM  Result Value Ref Range   Sodium 141 135 - 145 mmol/L   Potassium 4.0 3.5 - 5.1 mmol/L   Chloride 112 (H) 98 - 111 mmol/L   CO2 22 22 - 32 mmol/L   Glucose, Bld 106 (H) 70 - 99 mg/dL    Comment: Glucose reference range applies only to samples taken after fasting for at least 8 hours.    BUN 18 6 - 20 mg/dL   Creatinine, Ser 9.45 0.44 - 1.00 mg/dL   Calcium 8.7 (L) 8.9 - 10.3 mg/dL   GFR, Estimated >39 >  60 mL/min    Comment: (NOTE) Calculated using the CKD-EPI Creatinine Equation (2021)    Anion gap 7 5 - 15    Comment: Performed at [facility], [address] [place], [place] [address]  CBC     Status: Abnormal   Collection Time: 11/06/23  5:32 AM  Result Value Ref Range   WBC 5.5 4.0 - 10.5 K/uL   RBC 3.87 3.87 - 5.11 MIL/uL   Hemoglobin 11.7 (L) 12.0 - 15.0 g/dL   HCT 63.4 63.9 - 53.9 %   MCV 94.3 80.0 - 100.0 fL   MCH 30.2 26.0 - 34.0 pg   MCHC 32.1 30.0 - 36.0 g/dL   RDW 84.9 88.4 - 84.4 %   Platelets 178 150 - 400 K/uL   nRBC 0.0 0.0 - 0.2 %    Comment: Performed at [facility], [address] [place], [place] [address]  Basic metabolic panel     Status: Abnormal   Collection Time: 11/07/23  7:18 AM  Result Value Ref Range   Sodium 140 135 - 145 mmol/L   Potassium 4.4 3.5 - 5.1 mmol/L   Chloride 108 98 - 111 mmol/L   CO2 25 22 - 32 mmol/L   Glucose, Bld 107 (H) 70 - 99 mg/dL    Comment: Glucose reference range applies only to samples taken after fasting for at least 8 hours.   BUN 19 6 - 20 mg/dL   Creatinine, Ser 9.39 0.44 - 1.00 mg/dL   Calcium 8.9 8.9 - 89.6 mg/dL   GFR, Estimated >39 >39 mL/min    Comment: (NOTE) Calculated using the CKD-EPI Creatinine Equation (2021)    Anion gap 7 5 - 15    Comment: Performed at [facility], [address], [place], [place] [address]  Brain natriuretic peptide     Status: None   Collection Time: 11/07/23  7:18 AM  Result Value Ref Range   B Natriuretic Peptide 67.1 0.0 - 100.0 pg/mL    Comment: Performed at [facility], [address], [place], [place] [address]  CBC     Status: None   Collection Time: 11/07/23  7:18 AM  Result Value Ref Range   WBC 6.4 4.0 - 10.5 K/uL   RBC 3.99 3.87 - 5.11 MIL/uL   Hemoglobin 12.1 12.0 -  15.0 g/dL   HCT 62.4 63.9 - 53.9 %   MCV 94.0 80.0 - 100.0 fL   MCH 30.3 26.0 - 34.0 pg   MCHC 32.3 30.0 - 36.0 g/dL   RDW 85.3 88.4 - 84.4 %   Platelets 192 150 - 400 K/uL   nRBC 0.0 0.0 - 0.2 %    Comment: Performed at [facility], [address], [place], [place] [address]  Phosphorus     Status: None   Collection Time: 11/07/23  7:18 AM  Result Value Ref Range   Phosphorus 4.3 2.5 - 4.6 mg/dL    Comment: Performed at [facility], [address] [place], [place] [address]  Magnesium      Status: None   Collection Time: 11/07/23  7:18 AM  Result Value Ref Range   Magnesium  2.2 1.7 - 2.4 mg/dL    Comment: Performed at [facility], [address], [place], [place] [address]  Basic metabolic panel     Status: Abnormal   Collection Time: 11/08/23  5:08 AM  Result Value Ref Range   Sodium  141 135 - 145 mmol/L   Potassium 4.6 3.5 - 5.1 mmol/L   Chloride 109 98 - 111 mmol/L   CO2 27 22 - 32 mmol/L   Glucose, Bld 123 (H) 70 - 99 mg/dL    Comment: Glucose reference range applies only to samples taken after fasting for at least 8 hours.   BUN 18 6 - 20 mg/dL   Creatinine, Ser 9.33 0.44 - 1.00 mg/dL   Calcium 9.0 8.9 - 89.6 mg/dL   GFR, Estimated >39 >39 mL/min    Comment: (NOTE) Calculated using the CKD-EPI Creatinine Equation (2021)    Anion gap 5 5 - 15    Comment: Performed at [facility], [address], [place], [place] [address]  CBC     Status: Abnormal   Collection Time: 11/08/23  5:08 AM  Result Value Ref Range   WBC 6.1 4.0 - 10.5 K/uL   RBC 3.83 (L) 3.87 - 5.11 MIL/uL   Hemoglobin 11.8 (L) 12.0 - 15.0 g/dL   HCT 64.3 (L) 63.9 - 53.9 %   MCV 93.0 80.0 - 100.0 fL   MCH 30.8 26.0 - 34.0 pg   MCHC 33.1 30.0 - 36.0 g/dL   RDW 85.3 88.4 - 84.4 %   Platelets 187 150 - 400 K/uL   nRBC 0.0 0.0 - 0.2 %    Comment: Performed at [facility], [address] [place], [place]  [address]  Magnesium      Status: None   Collection Time: 11/08/23  5:08 AM  Result Value Ref Range   Magnesium  2.2 1.7 - 2.4 mg/dL    Comment: Performed at [facility], [address] [place], [place] [address]  Basic metabolic panel     Status: Abnormal   Collection Time: 11/09/23  4:28 AM  Result Value Ref Range   Sodium 142 135 - 145 mmol/L   Potassium 3.6 3.5 - 5.1 mmol/L   Chloride 109 98 - 111 mmol/L   CO2 26 22 - 32 mmol/L   Glucose, Bld 93 70 - 99 mg/dL    Comment: Glucose reference range applies only to samples taken after fasting for at least 8 hours.   BUN 20 6 - 20 mg/dL   Creatinine, Ser 9.17 0.44 - 1.00 mg/dL   Calcium 8.8 (L) 8.9 - 10.3 mg/dL   GFR, Estimated >39 >39 mL/min    Comment: (NOTE) Calculated using the CKD-EPI Creatinine Equation (2021)    Anion gap 7 5 - 15    Comment: Performed at [facility], [address] [place], [place] [address]  CBC     Status: Abnormal   Collection Time: 11/09/23  4:28 AM  Result Value Ref Range   WBC 6.7 4.0 - 10.5 K/uL   RBC 3.90 3.87 - 5.11 MIL/uL   Hemoglobin 11.8 (L) 12.0 - 15.0 g/dL   HCT 63.1 63.9 - 53.9 %   MCV 94.4 80.0 - 100.0 fL   MCH 30.3 26.0 - 34.0 pg   MCHC 32.1 30.0 - 36.0 g/dL   RDW 85.3 88.4 - 84.4 %   Platelets 186 150 - 400 K/uL   nRBC 0.0 0.0 - 0.2 %    Comment: Performed at [facility], [address] [place], [place] [address]  Magnesium      Status: None   Collection Time: 11/09/23  4:28 AM  Result Value Ref Range   Magnesium  2.1 1.7 - 2.4 mg/dL  Comment: Performed at [facility], [address], [place], [place] [address]     Psychiatric Specialty Exam: Physical Exam  Review of Systems  There were no vitals taken for this visit.There is no height or weight on file to calculate BMI.  General Appearance: Casual  Eye Contact:  Good  Speech:  Normal Rate  Volume:  Normal  Mood:  Anxious  Affect:  Appropriate   Thought Process:  Goal Directed  Orientation:  Full (Time, Place, and Person)  Thought Content:  WDL  Suicidal Thoughts:  No  Homicidal Thoughts:  No  Memory:  Immediate;   Good Recent;   Good Remote;   Good  Judgement:  Intact  Insight:  Good  Psychomotor Activity:  Decreased  Concentration:  Concentration: Good and Attention Span: Good  Recall:  Good  Fund of Knowledge:  Good  Language:  Good  Akathisia:  No  Handed:  Right  AIMS (if indicated):     Assets:  Communication Skills Desire for Improvement Housing Social Support Transportation  ADL's:  Intact  Cognition:  WNL  Sleep:  too much     Assessment/Plan: MDD (major depressive disorder), recurrent episode, moderate (HCC) - Plan: sertraline  (ZOLOFT ) 100 MG tablet  Anxiety - Plan: sertraline  (ZOLOFT ) 100 MG tablet  Reviewed current medication and blood work results.  Patient stable on Zoloft  150 mg daily.  She is no longer taking Klonopin .  Her neurologist is giving Topamax  for headaches but before she was taking nortriptyline.  I discussed 1 possibility of fatigue could be due to Topamax .  She also have chronic health issues with multiple hospitalization in past few months.  Recommended to call us  back if she is any question or any concern.  Continue Zoloft  150 mg daily.  Follow-up in 3 months   Follow Up Instructions:     I discussed the assessment and treatment plan with the patient. The patient was provided an opportunity to ask questions and all were answered. The patient agreed with the plan and demonstrated an understanding of the instructions.   The patient was advised to call back or seek an in-person evaluation if the symptoms worsen or if the condition fails to improve as anticipated.    Collaboration of Care: Other provider involved in patient's care AEB notes are available in epic to review  Patient/Guardian was advised Release of Information must be obtained prior to any record release in order to  collaborate their care with an outside provider. Patient/Guardian was advised if they have not already done so to contact the registration department to sign all necessary forms in order for us  to release information regarding their care.   Consent: Patient/Guardian gives verbal consent for treatment and assignment of benefits for services provided during this visit. Patient/Guardian expressed understanding and agreed to proceed.     I provided 21 minutes of non face to face time during this encounter.  Note: This document was prepared by [employer] voice dictation technology and any errors that results from this process are unintentional.    Leni ONEIDA Client, MD 12/10/2023

## 2024-01-04 ENCOUNTER — Ambulatory Visit (HOSPITAL_BASED_OUTPATIENT_CLINIC_OR_DEPARTMENT_OTHER)
Admission: RE | Admit: 2024-01-04 | Discharge: 2024-01-04 | Disposition: A | Discharge status: Home / Self Care | Payer: HMO | Source: Ambulatory Visit | Attending: Acute Care | Admitting: Acute Care

## 2024-01-04 DIAGNOSIS — R911 Solitary pulmonary nodule: Secondary | ICD-10-CM | POA: Diagnosis present

## 2024-01-04 DIAGNOSIS — Z87891 Personal history of nicotine dependence: Secondary | ICD-10-CM | POA: Diagnosis present

## 2024-01-06 ENCOUNTER — Encounter (HOSPITAL_BASED_OUTPATIENT_CLINIC_OR_DEPARTMENT_OTHER): Payer: HMO

## 2024-01-06 ENCOUNTER — Ambulatory Visit: Payer: HMO | Admitting: Pulmonary Disease

## 2024-01-13 ENCOUNTER — Telehealth: Payer: Self-pay

## 2024-01-13 ENCOUNTER — Encounter: Payer: Self-pay | Admitting: Acute Care

## 2024-01-13 ENCOUNTER — Ambulatory Visit: Payer: HMO | Admitting: Acute Care

## 2024-01-13 ENCOUNTER — Ambulatory Visit (HOSPITAL_BASED_OUTPATIENT_CLINIC_OR_DEPARTMENT_OTHER): Payer: HMO | Admitting: Emergency Medicine

## 2024-01-13 VITALS — BP 98/78 | HR 79 | Ht 61.0 in | Wt 120.6 lb

## 2024-01-13 DIAGNOSIS — R911 Solitary pulmonary nodule: Secondary | ICD-10-CM

## 2024-01-13 DIAGNOSIS — J449 Chronic obstructive pulmonary disease, unspecified: Secondary | ICD-10-CM

## 2024-01-13 DIAGNOSIS — J4489 Other specified chronic obstructive pulmonary disease: Secondary | ICD-10-CM

## 2024-01-13 DIAGNOSIS — F17200 Nicotine dependence, unspecified, uncomplicated: Secondary | ICD-10-CM | POA: Diagnosis not present

## 2024-01-13 LAB — PULMONARY FUNCTION TEST
DL/VA % pred: 73 %
DL/VA: 3.21 ml/min/mmHg/L
DLCO cor % pred: 64 %
DLCO cor: 12.03 ml/min/mmHg
DLCO unc % pred: 64 %
DLCO unc: 12.03 ml/min/mmHg
FEF 25-75 Post: 0.55 L/s
FEF 25-75 Pre: 0.3 L/s
FEF2575-%Change-Post: 82 %
FEF2575-%Pred-Post: 22 %
FEF2575-%Pred-Pre: 12 %
FEV1-%Change-Post: 34 %
FEV1-%Pred-Post: 39 %
FEV1-%Pred-Pre: 29 %
FEV1-Post: 0.97 L
FEV1-Pre: 0.72 L
FEV1FVC-%Change-Post: 7 %
FEV1FVC-%Pred-Pre: 54 %
FEV6-%Change-Post: 23 %
FEV6-%Pred-Post: 64 %
FEV6-%Pred-Pre: 52 %
FEV6-Post: 1.96 L
FEV6-Pre: 1.58 L
FEV6FVC-%Change-Post: -1 %
FEV6FVC-%Pred-Post: 96 %
FEV6FVC-%Pred-Pre: 98 %
FVC-%Change-Post: 25 %
FVC-%Pred-Post: 66 %
FVC-%Pred-Pre: 53 %
FVC-Post: 2.08 L
FVC-Pre: 1.66 L
Post FEV1/FVC ratio: 47 %
Post FEV6/FVC ratio: 94 %
Pre FEV1/FVC ratio: 44 %
Pre FEV6/FVC Ratio: 95 %
RV % pred: 209 %
RV: 3.55 L
TLC % pred: 124 %
TLC: 5.74 L

## 2024-01-13 NOTE — Patient Instructions (Signed)
 Full PFT Performed Today

## 2024-01-13 NOTE — Progress Notes (Signed)
 History of Present Illness Vicki Mcmillan is a 53 y.o. female former smoker quit in 2023.  Past medical history significant for COPD/asthma, acute respiratory failure, lung nodule, GERD, sensory hearing loss, TMJ, depression/generalized anxiety disorder.    01/13/2024 Pt. Presents for follow up after PFT's. She is also here to review follow up Ct chest after Lung Cancer Screening in 09/2024 showed a new 5 mm pulmonary lung nodule. Her scan , which was done 01/04/2024, has not been read by radiology , so we will schedule a video visit next week to review results with the hopes  it will have been read by then.   Pt has also had PFT's today as she has not had PFT's since 2022, and has had frequent flares. We will review them today as we do not have CT imaging results.   Discussed the use of AI scribe software for clinical note transcription with the patient, who gave verbal consent to proceed.  History of Present Illness   The patient is a 53 year old with severe COPD who presents for follow-up after recent hospitalizations for COPD exacerbations.  She experienced her last COPD flare from January 1 to November 09, 2023, which required hospitalization. Since then, she has been without a flare for two months. She also had a flare in November 2024. Her flares are more frequent in the fall, winter, and spring, correlating with pollen season. She uses an air purifier at home, which she notes has made a significant difference.  Her pulmonary function tests show a decrease in forced vital capacity from 1.79 to 1.66 and forced expiratory volume from 0.78 to 0.72, with a stable FEV1/FVC ratio of 44%, consistent with severe COPD. However, she has a good response to bronchodilators, with improvements in forced vital capacity to 2.08 and forced expiratory volume to 0.97 after medication. Her diffusion capacity has improved from 47% to 64%.  She is currently on Breztri, taking two puffs in the morning and  evening, and uses albuterol as needed, approximately once a day. She also takes Singulair. No hemoptysis and stable weight, though she lost some weight during her hospital stay.  She has a history of smoking and acknowledges the need to quit. Her mother reminds her of the importance of quitting, especially after her hospitalizations, which she found frightening due to difficulty breathing.      I have counseled her to quit smoking . We spent 3-4 minutes discussing smoking cessation options.  Test Results: PFT's 01/13/2024               Latest Ref Rng & Units 11/09/2023    4:28 AM 11/08/2023    5:08 AM 11/07/2023    7:18 AM  CBC  WBC 4.0 - 10.5 K/uL 6.7  6.1  6.4   Hemoglobin 12.0 - 15.0 g/dL 56.4  33.2  95.1   Hematocrit 36.0 - 46.0 % 36.8  35.6  37.5   Platelets 150 - 400 K/uL 186  187  192        Latest Ref Rng & Units 11/09/2023    4:28 AM 11/08/2023    5:08 AM 11/07/2023    7:18 AM  BMP  Glucose 70 - 99 mg/dL 93  884  166   BUN 6 - 20 mg/dL 20  18  19    Creatinine 0.44 - 1.00 mg/dL 0.63  0.16  0.10   Sodium 135 - 145 mmol/L 142  141  140   Potassium 3.5 - 5.1  mmol/L 3.6  4.6  4.4   Chloride 98 - 111 mmol/L 109  109  108   CO2 22 - 32 mmol/L 26  27  25    Calcium 8.9 - 10.3 mg/dL 8.8  9.0  8.9     BNP    Component Value Date/Time   BNP 67.1 11/07/2023 0718    ProBNP No results found for: "PROBNP"  PFT    Component Value Date/Time   FEV1PRE 0.72 01/13/2024 0926   FEV1POST 0.97 01/13/2024 0926   FVCPRE 1.66 01/13/2024 0926   FVCPOST 2.08 01/13/2024 0926   TLC 5.74 01/13/2024 0926   DLCOUNC 12.03 01/13/2024 0926   PREFEV1FVCRT 44 01/13/2024 0926   PSTFEV1FVCRT 47 01/13/2024 0926    No results found.   Past medical hx 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; cervical cancer   Complication of anesthesia    COPD (chronic obstructive pulmonary disease) (HCC)    Depression    GERD  (gastroesophageal reflux disease)    Headache    Myocardial infarction (HCC)    Panic attacks    PONV (postoperative nausea and vomiting)      Social History   Tobacco Use   Smoking status: Every Day    Current packs/day: 0.50    Average packs/day: 0.3 packs/day for 15.4 years (3.9 ttl pk-yrs)    Types: Cigarettes    Start date: 06/10/2007    Last attempt to quit: 06/09/2022   Smokeless tobacco: Never   Tobacco comments:    Started smoking again  Vaping Use   Vaping status: Former  Substance Use Topics   Alcohol use: No   Drug use: No    Ms.Bump reports that she has been smoking cigarettes. She started smoking about 16 years ago. She has a 3.9 pack-year smoking history. She has never used smokeless tobacco. She reports that she does not drink alcohol and does not use drugs.  Tobacco Cessation: Ready to quit: No Counseling given: Yes Tobacco comments: Started smoking again Current Every Day smoker with a 30 pack year smoking history  Past surgical hx, Family hx, Social hx all reviewed.  Current Outpatient Medications on File Prior to Visit  Medication Sig   albuterol (VENTOLIN HFA) 108 (90 Base) MCG/ACT inhaler Inhale 1-2 puffs into the lungs every 6 (six) hours as needed for wheezing or shortness of breath. INHALE 1 TO 2 PUFFS INTO THE LUNGS EVERY 6 HOURS AS NEEDED FOR WHEEZING OR SHORTNESS OF BREATH   Budeson-Glycopyrrol-Formoterol (BREZTRI AEROSPHERE) 160-9-4.8 MCG/ACT AERO Inhale 2 puffs into the lungs in the morning and at bedtime.   guaiFENesin (MUCINEX) 600 MG 12 hr tablet Take 1 tablet (600 mg total) by mouth 2 (two) times daily as needed for to loosen phlegm.   ibuprofen (ADVIL) 200 MG tablet Take 200 mg by mouth every 6 (six) hours as needed for headache.   loratadine (CLARITIN) 10 MG tablet Take 10 mg by mouth daily.   montelukast (SINGULAIR) 10 MG tablet Take 1 tablet (10 mg total) by mouth at bedtime.   pantoprazole (PROTONIX) 40 MG tablet Take 1 tablet (40 mg  total) by mouth daily.   prochlorperazine (COMPAZINE) 5 MG tablet Take 5 mg by mouth every 8 (eight) hours as needed for vomiting or nausea.   rizatriptan (MAXALT-MLT) 5 MG disintegrating tablet Take 5 mg by mouth as needed for migraine.   sertraline (ZOLOFT)  100 MG tablet Take 1.5 tablets (150 mg total) by mouth daily.   topiramate (TOPAMAX) 50 MG tablet Take 50 mg by mouth at bedtime.   TRELEGY ELLIPTA 200-62.5-25 MCG/ACT AEPB INHALE 1 PUFF EVERY DAY   Budeson-Glycopyrrol-Formoterol (BREZTRI AEROSPHERE) 160-9-4.8 MCG/ACT AERO Inhale 2 puffs into the lungs in the morning and at bedtime. (Patient not taking: Reported on 01/13/2024)   ipratropium-albuterol (DUONEB) 0.5-2.5 (3) MG/3ML SOLN Take 3 mLs by nebulization 2 (two) times daily as needed (asthma).   No current facility-administered medications on file prior to visit.     Allergies  Allergen Reactions   Hydrocodone-Acetaminophen Nausea And Vomiting   Tomato Rash   Bupropion Other (See Comments)    hallucinations   Oxycodone Nausea And Vomiting and Other (See Comments)    Stomach upset   Varenicline Other (See Comments)    hallucination    Review Of Systems:  Constitutional:   No  weight loss, night sweats,  Fevers, chills, fatigue, or  lassitude.  HEENT:   No headaches,  Difficulty swallowing,  Tooth/dental problems, or  Sore throat,                No sneezing, itching, ear ache, nasal congestion, post nasal drip,   CV:  No chest pain,  Orthopnea, PND, swelling in lower extremities, anasarca, dizziness, palpitations, syncope.   GI  No heartburn, indigestion, abdominal pain, nausea, vomiting, diarrhea, change in bowel habits, loss of appetite, bloody stools.   Resp: No shortness of breath with exertion or at rest.  No excess mucus, no productive cough,  No non-productive cough,  No coughing up of blood.  No change in color of mucus.  No wheezing.  No chest wall deformity  Skin: no rash or lesions.  GU: no dysuria, change in  color of urine, no urgency or frequency.  No flank pain, no hematuria   MS:  No joint pain or swelling.  No decreased range of motion.  No back pain.  Psych:  No change in mood or affect. No depression or anxiety.  No memory loss.   Vital Signs BP 98/78 (BP Location: Left Arm, Patient Position: Sitting, Cuff Size: Normal)   Pulse 79   Ht 5\' 1"  (1.549 m)   Wt 120 lb 9.6 oz (54.7 kg)   SpO2 97%   BMI 22.79 kg/m    Physical Exam:  General- No distress,  A&Ox3, pleasant ENT: No sinus tenderness, TM clear, pale nasal mucosa, no oral exudate,no post nasal drip, no LAN Cardiac: S1, S2, regular rate and rhythm, no murmur Chest: No wheeze/ rales/ dullness; no accessory muscle use, no nasal flaring, no sternal retractions Abd.: Soft Non-tender, ND, BS +, Body mass index is 22.79 kg/m.  Ext: No clubbing cyanosis, edema Neuro:  normal strength, MAE x 4, A&O x 3 Skin: No rashes, warm and dry, no obvious lesions Psych: normal mood and behavior   Assessment/Plan Pulmonary Nodule COPD with Asthmatic Component  Two hospitalizations for COPD exacerbation in the last 3 months. CXR's were clear without acute process.  Current therapy with Trelegy  Breztri Therapeutic Trial Plan Your PFT's do show come decline over the last 2 years with your frequent COPD exacerbations. Please continue to work on quitting smoking. You can receive free nicotine replacement therapy ( patches, gum or mints) by calling 1-800-QUIT NOW. Please call so we can get you on the path to becoming  a non-smoker. I know it is hard, but you can do this!  Other  options for assistance in smoking cessation ( As covered by your insurance benefits)  Hypnosis for smoking cessation  [employer] [phone]  Acupuncture for smoking cessation  [facility] [phone]     We will schedule a video visit to review your CT Chest results within the next week. Continue Breztri 2 puffs in the morning  and 2 puffs in the evening. Rinse mouth after use.  Continue Albuterol as needed for shortness of breath or wheezing . Continue Singulair daily as prescribed>> with caution regarding recent research linking it to Alzheimer's.  If you continue to have frequent flares we will discuss  adding PDE4 inhibitor or biologic therapy such as Dupixent or Tezspire   Note your daily symptoms > remember "red flags" for COPD:  Increase in cough, increase in sputum production, increase in shortness of breath or activity intolerance. If you notice these symptoms, please call to be seen.    Please contact office for sooner follow up if symptoms do not improve or worsen or seek emergency care   -Continue Mucinex daily, increasing to twice daily if increased chest congestion is noted. If still having flare ups consider adding PDE4 inhibitor or biologic therapy such as Dupixent or Tezspire in the future  I spent 25 minutes dedicated to the care of this patient on the date of this encounter to include pre-visit review of records, face-to-face time with the patient discussing conditions above, post visit ordering of testing, clinical documentation with the electronic health record, making appropriate referrals as documented, and communicating necessary information to the patient's healthcare team.   Bevelyn Ngo, NP 01/13/2024  11:30 AM

## 2024-01-13 NOTE — Telephone Encounter (Signed)
 ATC X1. Lmtcb. We do not have pt's CT results back from radiology. Pt's appointment with Kandice Robinsons, NP at 11:00 am needs to be canceled and rescheduled for possibly another week. Please consult with Kandice Robinsons, NP about appointment dates.

## 2024-01-13 NOTE — Patient Instructions (Addendum)
 It is good to see you today  Your PFT's do show come decline over the last 2 years with your frequent COPD exacerbations. Please continue to work on quitting smoking. You can receive free nicotine replacement therapy ( patches, gum or mints) by calling 1-800-QUIT NOW. Please call so we can get you on the path to becoming  a non-smoker. I know it is hard, but you can do this!  Other options for assistance in smoking cessation ( As covered by your insurance benefits)  Hypnosis for smoking cessation  [employer] [phone]  Acupuncture for smoking cessation  [facility] [phone]     We will schedule a video visit to review your CT Chest results within the next week. Continue Breztri 2 puffs in the morning and 2 puffs in the evening. Rinse mouth after use.  Continue Albuterol as needed for shortness of breath or wheezing . Continue Singulair daily as prescribed.  If you continue to have frequent flares we will discuss  adding PDE4 inhibitor or biologic therapy such as Dupixent or Tezspire   Note your daily symptoms > remember "red flags" for COPD:  Increase in cough, increase in sputum production, increase in shortness of breath or activity intolerance. If you notice these symptoms, please call to be seen.    Please contact office for sooner follow up if symptoms do not improve or worsen or seek emergency care

## 2024-01-13 NOTE — Progress Notes (Signed)
 Full PFT Performed Today

## 2024-01-15 ENCOUNTER — Other Ambulatory Visit: Payer: Self-pay

## 2024-01-15 DIAGNOSIS — Z87891 Personal history of nicotine dependence: Secondary | ICD-10-CM

## 2024-01-15 DIAGNOSIS — Z122 Encounter for screening for malignant neoplasm of respiratory organs: Secondary | ICD-10-CM

## 2024-01-20 ENCOUNTER — Other Ambulatory Visit: Payer: Self-pay

## 2024-01-20 ENCOUNTER — Telehealth: Admitting: Acute Care

## 2024-01-20 DIAGNOSIS — J449 Chronic obstructive pulmonary disease, unspecified: Secondary | ICD-10-CM | POA: Diagnosis not present

## 2024-01-20 DIAGNOSIS — R911 Solitary pulmonary nodule: Secondary | ICD-10-CM

## 2024-01-20 DIAGNOSIS — R918 Other nonspecific abnormal finding of lung field: Secondary | ICD-10-CM | POA: Diagnosis not present

## 2024-01-20 DIAGNOSIS — F17210 Nicotine dependence, cigarettes, uncomplicated: Secondary | ICD-10-CM

## 2024-01-20 DIAGNOSIS — Z87891 Personal history of nicotine dependence: Secondary | ICD-10-CM

## 2024-01-20 DIAGNOSIS — Z122 Encounter for screening for malignant neoplasm of respiratory organs: Secondary | ICD-10-CM

## 2024-01-20 DIAGNOSIS — F17200 Nicotine dependence, unspecified, uncomplicated: Secondary | ICD-10-CM

## 2024-01-20 NOTE — Progress Notes (Unsigned)
 Virtual Visit via Video Note  I connected with Vicki Mcmillan on 01/20/24 at  1:30 PM EDT by a video enabled telemedicine application and verified that I am speaking with the correct person using two identifiers.  Location: Patient:  At home  Provider:  [address] [place], [place], [address]    I discussed the limitations of evaluation and management by telemedicine and the availability of in person appointments. The patient expressed understanding and agreed to proceed.  History of Present Illness:    Observations/Objective:   Assessment and Plan:   Follow Up Instructions:    I discussed the assessment and treatment plan with the patient. The patient was provided an opportunity to ask questions and all were answered. The patient agreed with the plan and demonstrated an understanding of the instructions.   The patient was advised to call back or seek an in-person evaluation if the symptoms worsen or if the condition fails to improve as anticipated.  I provided *** minutes of non-face-to-face time during this encounter.   Bevelyn Ngo, NP visit

## 2024-01-21 ENCOUNTER — Encounter: Payer: Self-pay | Admitting: Acute Care

## 2024-01-21 NOTE — Patient Instructions (Addendum)
 It is good to see you remotely today Your scan shows that the 5.2 mm nodule in your right upper lobe has decreased in size from 5.2 mm to 0.7 mm. This is great news There are multiple new nonsolid lung nodules measuring up to 8.1 mm which are most likely postinflammatory change. We will do a 32-month follow-up low-dose CT to ensure stability of these nodules. If they remained stable on follow-up imaging we will consider moving you back into annual screening. Continue using Breztri as you have been doing for your COPD. Rinse mouth after use Note your daily symptoms > remember "red flags" for COPD:  Increase in cough, increase in sputum production, increase in shortness of breath or activity intolerance. If you notice these symptoms, please call to be seen.    If you continue to have flares of your COPD at a frequent basis we will consider adding a biologic to see if that improves your overall respiratory status. Please continue to work on quitting smoking You can receive free nicotine replacement therapy (patches, gum, or mints) by calling 1-800-QUIT NOW. Please call so we can get you on the path to becoming a non-smoker. I know it is hard, but you can do this!  Hypnosis for smoking cessation  [employer] [phone]  Acupuncture for smoking cessation  [facility] [phone]   Call for any hemoptysis or unexplained weight loss Call to be seen sooner if you need [place] Please contact office for sooner follow up if symptoms do not improve or worsen or seek emergency care

## 2024-01-22 ENCOUNTER — Other Ambulatory Visit: Payer: Self-pay | Admitting: Pulmonary Disease

## 2024-02-24 NOTE — Telephone Encounter (Signed)
 Got this message,she wanted some medicine sent in did you want her to be scheduled for an acute visit with you or anyone to be seen or advise

## 2024-02-29 ENCOUNTER — Other Ambulatory Visit: Payer: Self-pay | Admitting: Acute Care

## 2024-02-29 DIAGNOSIS — R0600 Dyspnea, unspecified: Secondary | ICD-10-CM

## 2024-02-29 MED ORDER — PREDNISONE 10 MG PO TABS: ORAL_TABLET | ORAL | 0 refills | Status: DC

## 2024-02-29 NOTE — Telephone Encounter (Unsigned)
 Copied from CRM [id number]. Topic: Clinical - Medical Advice >> Feb 29, 2024 11:21 AM Ilene Malling wrote: Reason for CRM: Patient [phone] is checking in from a message sent through Mychart last week. Patient is still not any better, currently taking Mucinex  and wants to know if a steroid can be sent to a pharmacy or does she need to be seen? Patient states NP, Groce advised patient if similar symptoms prior to hospitalization 11/24, to contact and ask for steroids. Please advise and call back today.   [id number] - [place], [place] - [address] [place] [address] [phone]:[phone]

## 2024-03-03 ENCOUNTER — Other Ambulatory Visit (HOSPITAL_COMMUNITY): Payer: Self-pay | Admitting: Psychiatry

## 2024-03-03 DIAGNOSIS — F419 Anxiety disorder, unspecified: Secondary | ICD-10-CM

## 2024-03-03 DIAGNOSIS — F331 Major depressive disorder, recurrent, moderate: Secondary | ICD-10-CM

## 2024-03-10 ENCOUNTER — Telehealth (HOSPITAL_COMMUNITY): Payer: HMO | Admitting: Psychiatry

## 2024-03-11 ENCOUNTER — Telehealth (HOSPITAL_COMMUNITY): Admitting: Psychiatry

## 2024-03-22 ENCOUNTER — Telehealth (HOSPITAL_BASED_OUTPATIENT_CLINIC_OR_DEPARTMENT_OTHER): Admitting: Psychiatry

## 2024-03-22 ENCOUNTER — Encounter (HOSPITAL_COMMUNITY): Payer: Self-pay | Admitting: Psychiatry

## 2024-03-22 DIAGNOSIS — F419 Anxiety disorder, unspecified: Secondary | ICD-10-CM

## 2024-03-22 DIAGNOSIS — F331 Major depressive disorder, recurrent, moderate: Secondary | ICD-10-CM

## 2024-03-22 MED ORDER — SERTRALINE HCL 100 MG PO TABS: 150.0000 mg | ORAL_TABLET | Freq: Every day | ORAL | 2 refills | Status: DC

## 2024-03-22 NOTE — Progress Notes (Signed)
 [facility] MD Virtual Progress Note   Patient Location: Home Provider Location: Office  I connect with patient by video and verified that I am speaking with correct person by using two identifiers. I discussed the limitations of evaluation and management by telemedicine and the availability of in person appointments. I also discussed with the patient that there may be a patient responsible charge related to this service. The patient expressed understanding and agreed to proceed.  Vicki Mcmillan [id number] 53 y.o.  03/22/2024 11:24 AM  History of Present Illness:  Patient is evaluated by video session.  She is taking sertraline  150 mg and that is helping her anxiety and depression.  She denies any major panic attack, crying spells or any feeling of hopelessness or worthlessness.  Since she cut down the Topamax  dose she noticed improvement in her energy and motivation.  She has not discussed with her neurologist but her headaches are not as bad.  She denies any crying spells or any feeling of hopelessness or worthlessness.  Her sleep is good.  She recently have exacerbation of COPD and given steroids.  She is feeling better and her breathing is much better.  She has no tremors shakes or any EPS.  She wants to continue Zoloft  at present dose.  Her appetite is okay and her weight is stable.  Past Psychiatric History: No h/o suicidal attempt or inpatient treatment.  Saw psychiatrist after Chantix caused increased anxiety, hallucination and memory impairment.  Saw Dr. Moira Andrews at [facility] and given Zoloft  Abilify with good response.  PCP prescribed Xanax . Took Klonopin  for a while.    Outpatient Encounter Medications as of 03/22/2024  Medication Sig   albuterol  (VENTOLIN  HFA) 108 (90 Base) MCG/ACT inhaler Inhale 1-2 puffs into the lungs every 6 (six) hours as needed for wheezing or shortness of breath. INHALE 1 TO 2 PUFFS INTO THE LUNGS EVERY 6 HOURS AS NEEDED FOR WHEEZING OR  SHORTNESS OF BREATH   Budeson-Glycopyrrol-Formoterol (BREZTRI  AEROSPHERE) 160-9-4.8 MCG/ACT AERO Inhale 2 puffs into the lungs in the morning and at bedtime.   Budeson-Glycopyrrol-Formoterol (BREZTRI  AEROSPHERE) 160-9-4.8 MCG/ACT AERO Inhale 2 puffs into the lungs in the morning and at bedtime.   guaiFENesin  (MUCINEX ) 600 MG 12 hr tablet Take 1 tablet (600 mg total) by mouth 2 (two) times daily as needed for to loosen phlegm.   ibuprofen  (ADVIL ) 200 MG tablet Take 200 mg by mouth every 6 (six) hours as needed for headache.   ipratropium-albuterol  (DUONEB) 0.5-2.5 (3) MG/3ML SOLN Take 3 mLs by nebulization 2 (two) times daily as needed (asthma).   loratadine  (CLARITIN ) 10 MG tablet Take 10 mg by mouth daily.   montelukast  (SINGULAIR ) 10 MG tablet Take 1 tablet (10 mg total) by mouth at bedtime.   pantoprazole  (PROTONIX ) 40 MG tablet Take 1 tablet (40 mg total) by mouth daily.   predniSONE  (DELTASONE ) 10 MG tablet Prednisone  taper; 10 mg tablets: 4 tabs x 2 days, 3 tabs x 2 days, 2 tabs x 2 days 1 tab x 2 days then stop.   prochlorperazine (COMPAZINE) 5 MG tablet Take 5 mg by mouth every 8 (eight) hours as needed for vomiting or nausea.   rizatriptan  (MAXALT -MLT) 5 MG disintegrating tablet Take 5 mg by mouth as needed for migraine.   sertraline  (ZOLOFT ) 100 MG tablet Take 1.5 tablets (150 mg total) by mouth daily.   topiramate  (TOPAMAX ) 50 MG tablet Take 50 mg by mouth at bedtime.   TRELEGY ELLIPTA  200-62.5-25 MCG/ACT AEPB INHALE 1  PUFF EVERY DAY   No facility-administered encounter medications on file as of 03/22/2024.    Recent Results (from the past 2160 hours)  Pulmonary function test     Status: None   Collection Time: 01/13/24  9:26 AM  Result Value Ref Range   FVC-Pre 1.66 L   FVC-%Pred-Pre 53 %   FVC-Post 2.08 L   FVC-%Pred-Post 66 %   FVC-%Change-Post 25 %   FEV1-Pre 0.72 L   FEV1-%Pred-Pre 29 %   FEV1-Post 0.97 L   FEV1-%Pred-Post 39 %   FEV1-%Change-Post 34 %   FEV6-Pre 1.58  L   FEV6-%Pred-Pre 52 %   FEV6-Post 1.96 L   FEV6-%Pred-Post 64 %   FEV6-%Change-Post 23 %   Pre FEV1/FVC ratio 44 %   FEV1FVC-%Pred-Pre 54 %   Post FEV1/FVC ratio 47 %   FEV1FVC-%Change-Post 7 %   Pre FEV6/FVC Ratio 95 %   FEV6FVC-%Pred-Pre 98 %   Post FEV6/FVC ratio 94 %   FEV6FVC-%Pred-Post 96 %   FEV6FVC-%Change-Post -1 %   FEF 25-75 Pre 0.30 L/sec   FEF2575-%Pred-Pre 12 %   FEF 25-75 Post 0.55 L/sec   FEF2575-%Pred-Post 22 %   FEF2575-%Change-Post 82 %   RV 3.55 L   RV % pred 209 %   TLC 5.74 L   TLC % pred 124 %   DLCO unc 12.03 ml/min/mmHg   DLCO unc % pred 64 %   DLCO cor 12.03 ml/min/mmHg   DLCO cor % pred 64 %   DL/VA 2.84 ml/min/mmHg/L   DL/VA % pred 73 %     Psychiatric Specialty Exam: Physical Exam  Review of Systems  Weight 120 lb (54.4 kg).There is no height or weight on file to calculate BMI.  General Appearance: Casual  Eye Contact:  Good  Speech:  Clear and Coherent and Normal Rate  Volume:  Normal  Mood:  Euthymic  Affect:  Appropriate  Thought Process:  Goal Directed  Orientation:  Full (Time, Place, and Person)  Thought Content:  WDL  Suicidal Thoughts:  No  Homicidal Thoughts:  No  Memory:  Immediate;   Good Recent;   Good Remote;   Good  Judgement:  Good  Insight:  Present  Psychomotor Activity:  Normal  Concentration:  Concentration: Good and Attention Span: Good  Recall:  Good  Fund of Knowledge:  Good  Language:  Good  Akathisia:  No  Handed:  Right  AIMS (if indicated):     Assets:  Communication Skills Desire for Improvement Housing Social Support Transportation  ADL's:  Intact  Cognition:  WNL  Sleep:  fair       12/10/2021    4:03 PM 10/09/2021   11:05 AM  Depression screen PHQ 2/9  Decreased Interest 0 3  Down, Depressed, Hopeless 0 1  PHQ - 2 Score 0 4  Altered sleeping 0 1  Tired, decreased energy 0 3  Change in appetite 0 0  Feeling bad or failure about yourself  0 0  Trouble concentrating 0 0  Moving  slowly or fidgety/restless 0 0  Suicidal thoughts 0 0  PHQ-9 Score 0 8  Difficult doing work/chores Not difficult at all Not difficult at all    Assessment/Plan: Anxiety - Plan: sertraline  (ZOLOFT ) 100 MG tablet  MDD (major depressive disorder), recurrent episode, moderate (HCC) - Plan: sertraline  (ZOLOFT ) 100 MG tablet  Patient is stable on current dose of Zoloft  150 mg daily.  She also cut down the Topamax  50 mg once a  day rather than twice a day which is helping her motivation and energy.  She will discuss with her neurology about dose reduction when she had appointment but overall she feel her headaches are not getting worse.  I review current medication and blood work results.  Patient like to continue Zoloft  150 mg daily.  Recommend to call us  back if she has any question or any concern.  Follow-up in 3 months.   Follow Up Instructions:     I discussed the assessment and treatment plan with the patient. The patient was provided an opportunity to ask questions and all were answered. The patient agreed with the plan and demonstrated an understanding of the instructions.   The patient was advised to call back or seek an in-person evaluation if the symptoms worsen or if the condition fails to improve as anticipated.    Collaboration of Care: Other provider involved in patient's care AEB notes are available in epic to review  Patient/Guardian was advised Release of Information must be obtained prior to any record release in order to collaborate their care with an outside provider. Patient/Guardian was advised if they have not already done so to contact the registration department to sign all necessary forms in order for us  to release information regarding their care.   Consent: Patient/Guardian gives verbal consent for treatment and assignment of benefits for services provided during this visit. Patient/Guardian expressed understanding and agreed to proceed.     Total encounter time 15  minutes which includes face-to-face time, chart reviewed, care coordination, order entry and documentation during this encounter.   Note: This document was prepared by [employer] voice dictation technology and any errors that results from this process are unintentional.    Arturo Late, MD 03/22/2024

## 2024-04-05 ENCOUNTER — Telehealth: Payer: Self-pay | Admitting: Acute Care

## 2024-04-05 MED ORDER — ALBUTEROL SULFATE HFA 108 (90 BASE) MCG/ACT IN AERS: 1.0000 | INHALATION_SPRAY | Freq: Four times a day (QID) | RESPIRATORY_TRACT | 1 refills | Status: DC | PRN

## 2024-04-05 NOTE — Telephone Encounter (Signed)
 Albuterol  inhaler was refilled  I called the pt and there was no answer and no VM  I have sent her mychart msg to let her know it was sent \ Nothing further needed

## 2024-04-05 NOTE — Telephone Encounter (Signed)
 Copied from CRM [id number]. Topic: Clinical - Medication Refill >> Apr 05, 2024 11:17 AM Hilton Lucky wrote: Medication: albuterol  (VENTOLIN  HFA) 108 (90 Base) MCG/ACT inhaler  Has the patient contacted their pharmacy? No (Agent: If no, request that the patient contact the pharmacy for the refill. If patient does not wish to contact the pharmacy document the reason why and proceed with request.) (Agent: If yes, when and what did the pharmacy advise?)  This is the patient's preferred pharmacy:  [id number] - [place], [place] - [address] [place] [address] Phone: [phone] Fax: [phone]  Is this the correct pharmacy for this prescription? Yes If no, delete pharmacy and type the correct one.   Has the prescription been filled recently? No  Is the patient out of the medication? Yes  Has the patient been seen for an appointment in the last year OR does the patient have an upcoming appointment? Yes  Can we respond through MyChart? Yes  Agent: Please be advised that Rx refills may take up to 3 business days. We ask that you follow-up with your pharmacy.

## 2024-05-10 NOTE — Progress Notes (Signed)
 Please make, subjective   Patient ID:  Vicki Mcmillan is a 53 y.o. (DOB 1971-01-19) female.    This is a 53 y.o. female who presents for follow up for headaches.  The patient was last seen on 11/10/2023 (record reviewed).  The headaches are unchanged in characterization from that described in the prior note.  The patient is unable to quantify the amount of headaches that she has had since her last visit.  Patient does endorse that she is continue to have a decrease in the frequency.  At her last visit, she did report a decrease in the intensity as well.  She is doing very well and is pleased with her current status.  She is continue on topiramate .  She does feel that the topiramate  is causing significant fatigue.  She has decreased down to once daily dosing and the fatigue has resolved.  She now reports she is doing well on the Topamax .  She has not been take the Compazine since her migraines are under much better control.  She has tried the Maxalt  which does offer some good relief.  However, she is not having to take as much of the Maxalt  since she has had a decrease in the frequency and intensity.  She denies any side effects of the Maxalt .  She denies any new complicating neurological features.  She does not describe overusing over-the-counter analgesics.  New complaints/problem: No new complaints. Frequency d/wk: 4-6. Recent injection/procedures: None. Current Prophylaxis: Topamax . Current Abortives: Maxalt , Compazine PT/Home exercise program? No. Nausea/Vomiting Present?: Yes. Recent Imaging?: None.   Mental Health: History of depression and anxiety which she reports both are stable Sleep Quality: Good, no difficulty initiating or maintaining sleep.  Takes 10 mg of melatonin per night Exercise: 0 days per week   Caffeine use:  Coffee: Two 6 ounce cups per day tea: 0, Sodas: Occasional Coke Alcohol Use: No Tobacco Use: 5 cigarettes/day vaping: No Artificial Sweetener Use:  Aspartame Use of Illicit drugs: No      CURRENT AND PAST MEDICATIONS:  Analgesics: Acetaminophen , BC headache powder  Anti-migraine: Rizatriptan   Heart/bp:     Decongestant/ antihistamine: Claritin   Anti-nauseant:    Nsaids: Ibuprofen   Muscle relaxants: Flexeril, baclofen  Anti-convulsants: Topamax   Steroids:    Sleeping pills/ tranquilizers:    Anti-depressants: Zoloft , nortriptyline  Herbal:     Fibromyalgia:     Hormonal:    Other:    Procedures for headaches:       Review of Systems Constitutional: Negative for fever. Respiratory: Negative for cough and shortness of breath.   Neurological: Positive for headaches.  All other systems reviewed and are negative.  Past Medical History, Past Surgery History, Allergies, Social History, and Family History were reviewed and updated.    Medications were reviewed and reconciled.  Objective   BP (!) 98/58 (BP Location: Left Upper Arm, Patient Position: Sitting)   Pulse 92   Ht 5' 1 (1.549 m)   Wt 111 lb (50.3 kg)   SpO2 95%   BMI 20.97 kg/m  General:  Alert and oriented, no acute distress HENNT:  Neck is supple.  Eyes anicteric.   Extremities:  No peripheral edema Skin:  No rash  Neurologic:  Mental Status:  Alert and oriented.  Language and speech appear grossly intact.  Memory, concentration and fund of knowledge are grossly intact.  Cranial nerve Exam:  Extraocular movements are intact without nystagmus.  No facial asymmetry.  Gross auditory is intact.    Motor Exam:  5/5 strength in the upper and lower extremities.  Good muscle tone and bulk.  No pronator drift.  Coordination:  Intact to finger to nose.  Gait:  Normal routine gait.  Reflexes:  +1 in the upper and lower extremities.  Sensation:  Intact to light touch.  Impression   1. Migraine with aura and without status migrainosus, not intractable   2. Intractable migraine with aura with status migrainosus   3. Tachycardia, paroxysmal (*)        53 year old female who returns for follow-up due to chronic headaches and migraines.  Patient has continued to have difficulty quantifying the amount of headaches that she is having.  However, she continues to note that she is having decreasing the frequency and intensity.  She found topiramate  to offer significant fatigue and has since decreased the dose by half.  Now she is only taking 50 mg daily.  Since decreasing the dose, she reports that the fatigue has resolved and she is continue to have a good reduction of her headache and migraine days.  She has not had to utilize the Maxalt  as her migraines are under much better control.  Her neurological examination remains unchanged.  I recommended we continue on the current treatment plan.  If the reduced dose of topiramate  does not offer her continued benefit, I recommend that we try her on either Qulipta or Nurtec prophylaxis.  Plan   1. Migraine with aura and without status migrainosus, not intractable (Primary) 2. Intractable migraine with aura with status migrainosus Headache Preventive Treatment: - topiramate  (TOPAMAX ) 50 MG tablet PO QHS  -Consider Qulipta or Nurtec PPx Acute headache medication: - Compazine 5 mg as needed for nausea  - Begin Maxalt  10 mg as needed   3. Tachycardia, paroxysmal (*) -Follow-up with PCP  - I counseled the patient regarding medication overuse headaches.  It is recommended that use of over the counter analgesics be limited.  This behavior may improve the headaches.    - Recommended cardiovascular exercise for headache prevention. - Maintain headache calendar - Stay well hydrated - Sleep hygiene    Risks, benefits, and alternatives of the medications and treatment plan prescribed today were discussed.  The patient expressed understanding of the instructions. All new prescription medications and changes in prescription dosages were discussed with the patient, including patient education, medication name,  use, and dosage, potential side effects, drug interactions, consequences of not using/taking and special instructions. The patient expressed understanding. No barriers to adherence are present.  Follow up in about 3 months (around 11/04/2023).   Electronically Signed: Fonda FREDRIK Minus, DNP, AGNP-C Neurology Nurse Practitioner  [facility] [place] 05/10/2024 3:56 PM  *This note was dictated with voice recognition software. Inadvertently, similar sounding words can, sometimes, get transcribed incorrectly

## 2024-06-11 ENCOUNTER — Other Ambulatory Visit (HOSPITAL_COMMUNITY): Payer: Self-pay | Admitting: Psychiatry

## 2024-06-11 DIAGNOSIS — F331 Major depressive disorder, recurrent, moderate: Secondary | ICD-10-CM

## 2024-06-11 DIAGNOSIS — F419 Anxiety disorder, unspecified: Secondary | ICD-10-CM

## 2024-06-12 ENCOUNTER — Other Ambulatory Visit: Payer: Self-pay | Admitting: Emergency Medicine

## 2024-06-22 ENCOUNTER — Telehealth (HOSPITAL_BASED_OUTPATIENT_CLINIC_OR_DEPARTMENT_OTHER): Admitting: Psychiatry

## 2024-06-22 ENCOUNTER — Encounter (HOSPITAL_COMMUNITY): Payer: Self-pay | Admitting: Psychiatry

## 2024-06-22 ENCOUNTER — Telehealth (HOSPITAL_COMMUNITY): Admitting: Psychiatry

## 2024-06-22 DIAGNOSIS — F419 Anxiety disorder, unspecified: Secondary | ICD-10-CM

## 2024-06-22 DIAGNOSIS — F331 Major depressive disorder, recurrent, moderate: Secondary | ICD-10-CM

## 2024-06-22 MED ORDER — SERTRALINE HCL 100 MG PO TABS: 150.0000 mg | ORAL_TABLET | Freq: Every day | ORAL | 2 refills | Status: DC

## 2024-06-22 NOTE — Progress Notes (Signed)
 [facility] MD Virtual Progress Note   Patient Location: Home Provider Location: Home Office  I connect with patient by video and verified that I am speaking with correct person by using two identifiers. I discussed the limitations of evaluation and management by telemedicine and the availability of in person appointments. I also discussed with the patient that there may be a patient responsible charge related to this service. The patient expressed understanding and agreed to proceed.  Vicki Mcmillan [id number] 53 y.o.  06/22/2024 10:53 AM  History of Present Illness:  Patient is evaluated by video session.  She is very happy as recently connected with her son who lives in [place].  Patient mention it has been a while that she has no communication with the son but recently find out that he had a baby on July 2 and they were able to connect with each other.  She was very happy because patient and his mother met the baby and she is hoping to have more visits in the future.  She reported things are going very well.  She denies any panic attack, crying spells or any feeling of hopelessness or worthlessness.  She feels the Zoloft  150 mg working very well for her.  She sleeps good.  Recently no exacerbation of COPD and her headaches are under control.  She is seeing dentist who is working on her teeth.  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  She has no tremors, shakes or any EPS.  She denies any paranoia or any major panic attack.  Her appetite is okay.  Her weight is stable.  She recently seen neurologist who is okay to continue Topamax  1 pill a day.  She noticed much improvement in her energy level and she is more active during summer.  Patient lives with her mother.  She denies drinking or using any illegal substances.  Past Psychiatric History: No h/o suicidal attempt or  inpatient treatment.  Saw psychiatrist after Chantix caused increased anxiety, hallucination and memory impairment.  Saw Dr. Gracia at [facility] and given Zoloft  Abilify with good response.  PCP prescribed Xanax . Took Klonopin  for a while.     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; cervical cancer   Complication of anesthesia    COPD (chronic obstructive pulmonary disease) (HCC)    Depression    GERD (gastroesophageal reflux disease)    Headache    Myocardial infarction (HCC)    Panic attacks    PONV (postoperative nausea and vomiting)     Outpatient Encounter Medications as of 06/22/2024  Medication Sig   albuterol  (VENTOLIN  HFA) 108 (90 Base) MCG/ACT inhaler INHALE 1-2 PUFFS INTO THE LUNGS EVERY 6 (SIX) HOURS AS NEEDED FOR WHEEZING OR SHORTNESS OF BREATH.   Budeson-Glycopyrrol-Formoterol (BREZTRI  AEROSPHERE) 160-9-4.8 MCG/ACT AERO Inhale 2 puffs into the lungs in the morning and at bedtime.   Budeson-Glycopyrrol-Formoterol (BREZTRI  AEROSPHERE) 160-9-4.8 MCG/ACT AERO Inhale 2 puffs into the lungs in the morning and at bedtime.   guaiFENesin  (MUCINEX ) 600 MG 12 hr tablet Take 1 tablet (600 mg total) by mouth 2 (two) times daily as needed for to loosen phlegm.   ibuprofen  (ADVIL ) 200 MG tablet Take 200 mg by mouth every 6 (six) hours as needed for headache.   ipratropium-albuterol  (DUONEB)  0.5-2.5 (3) MG/3ML SOLN Take 3 mLs by nebulization 2 (two) times daily as needed (asthma).   loratadine  (CLARITIN ) 10 MG tablet Take 10 mg by mouth daily.   montelukast  (SINGULAIR ) 10 MG tablet Take 1 tablet (10 mg total) by mouth at bedtime.   pantoprazole  (PROTONIX ) 40 MG tablet Take 1 tablet (40 mg total) by mouth daily.   predniSONE  (DELTASONE ) 10 MG tablet Prednisone  taper; 10 mg tablets: 4 tabs x 2 days, 3 tabs x 2 days, 2 tabs x 2 days 1 tab x 2 days then stop.   prochlorperazine (COMPAZINE) 5 MG tablet Take 5 mg by mouth every 8  (eight) hours as needed for vomiting or nausea.   rizatriptan  (MAXALT -MLT) 5 MG disintegrating tablet Take 5 mg by mouth as needed for migraine.   sertraline  (ZOLOFT ) 100 MG tablet Take 1.5 tablets (150 mg total) by mouth daily.   topiramate  (TOPAMAX ) 50 MG tablet Take 50 mg by mouth at bedtime.   TRELEGY ELLIPTA  200-62.5-25 MCG/ACT AEPB INHALE 1 PUFF EVERY DAY   No facility-administered encounter medications on file as of 06/22/2024.    No results found for this or any previous visit (from the past 2160 hours).   Psychiatric Specialty Exam: Physical Exam  Review of Systems  Weight 112 lb (50.8 kg).There is no height or weight on file to calculate BMI.  General Appearance: Casual  Eye Contact:  Good  Speech:  Clear and Coherent and Normal Rate  Volume:  Normal  Mood:  Euthymic  Affect:  Congruent  Thought Process:  Goal Directed  Orientation:  Full (Time, Place, and Person)  Thought Content:  Logical  Suicidal Thoughts:  No  Homicidal Thoughts:  No  Memory:  Immediate;   Good Recent;   Good Remote;   Good  Judgement:  Good  Insight:  Good  Psychomotor Activity:  Normal  Concentration:  Concentration: Good and Attention Span: Good  Recall:  Good  Fund of Knowledge:  Good  Language:  Good  Akathisia:  No  Handed:  Right  AIMS (if indicated):     Assets:  Communication Skills Desire for Improvement Housing Social Support Transportation  ADL's:  Intact  Cognition:  WNL  Sleep:  ok       12/10/2021    4:03 PM 10/09/2021   11:05 AM  Depression screen PHQ 2/9  Decreased Interest 0 3  Down, Depressed, Hopeless 0 1  PHQ - 2 Score 0 4  Altered sleeping 0 1  Tired, decreased energy 0 3  Change in appetite 0 0  Feeling bad or failure about yourself  0 0  Trouble concentrating 0 0  Moving slowly or fidgety/restless 0 0  Suicidal thoughts 0 0  PHQ-9 Score 0 8  Difficult doing work/chores Not difficult at all Not difficult at all    Assessment/Plan: Anxiety - Plan:  sertraline  (ZOLOFT ) 100 MG tablet  MDD (major depressive disorder), recurrent episode, moderate (HCC) - Plan: sertraline  (ZOLOFT ) 100 MG tablet  Patient is 53 year old Caucasian female with history of COPD, GERD, headaches, major depressive disorder and anxiety disorder.  Currently stable on Zoloft  150 mg daily.  She is very happy and is able to connect with her son who recently had a baby and she was able to see the 37 weeks old grandchild.  She is hoping in the future continue to have more visits.  Discussed medication side effects and benefits.  Continue Zoloft  150 mg daily.  She has no major concerns or side  effects.  Recommended to call back if she is any question or any concern.  Follow-up in 3 months.   Follow Up Instructions:     I discussed the assessment and treatment plan with the patient. The patient was provided an opportunity to ask questions and all were answered. The patient agreed with the plan and demonstrated an understanding of the instructions.   The patient was advised to call back or seek an in-person evaluation if the symptoms worsen or if the condition fails to improve as anticipated.    Collaboration of Care: Other provider involved in patient's care AEB notes are available in epic to review  Patient/Guardian was advised Release of Information must be obtained prior to any record release in order to collaborate their care with an outside provider. Patient/Guardian was advised if they have not already done so to contact the registration department to sign all necessary forms in order for us  to release information regarding their care.   Consent: Patient/Guardian gives verbal consent for treatment and assignment of benefits for services provided during this visit. Patient/Guardian expressed understanding and agreed to proceed.     Total encounter time 23 minutes which includes face-to-face time, chart reviewed, care coordination, order entry and documentation during this  encounter.   Note: This document was prepared by [employer] voice dictation technology and any errors that results from this process are unintentional.    Leni ONEIDA Client, MD 06/22/2024

## 2024-07-28 ENCOUNTER — Other Ambulatory Visit: Payer: Self-pay | Admitting: Emergency Medicine

## 2024-07-28 ENCOUNTER — Ambulatory Visit (HOSPITAL_BASED_OUTPATIENT_CLINIC_OR_DEPARTMENT_OTHER)

## 2024-08-11 ENCOUNTER — Ambulatory Visit (HOSPITAL_BASED_OUTPATIENT_CLINIC_OR_DEPARTMENT_OTHER)
Admission: RE | Admit: 2024-08-11 | Discharge: 2024-08-11 | Disposition: A | Discharge status: Home / Self Care | Source: Ambulatory Visit | Attending: Acute Care | Admitting: Acute Care

## 2024-08-11 DIAGNOSIS — R911 Solitary pulmonary nodule: Secondary | ICD-10-CM | POA: Insufficient documentation

## 2024-08-11 DIAGNOSIS — Z122 Encounter for screening for malignant neoplasm of respiratory organs: Secondary | ICD-10-CM | POA: Insufficient documentation

## 2024-08-11 DIAGNOSIS — Z87891 Personal history of nicotine dependence: Secondary | ICD-10-CM | POA: Diagnosis present

## 2024-08-16 ENCOUNTER — Other Ambulatory Visit: Payer: Self-pay

## 2024-08-16 DIAGNOSIS — Z122 Encounter for screening for malignant neoplasm of respiratory organs: Secondary | ICD-10-CM

## 2024-08-16 DIAGNOSIS — F17210 Nicotine dependence, cigarettes, uncomplicated: Secondary | ICD-10-CM

## 2024-08-16 DIAGNOSIS — Z87891 Personal history of nicotine dependence: Secondary | ICD-10-CM

## 2024-08-30 ENCOUNTER — Other Ambulatory Visit: Payer: Self-pay | Admitting: Medical Genetics

## 2024-09-13 ENCOUNTER — Other Ambulatory Visit (HOSPITAL_COMMUNITY): Payer: Self-pay | Admitting: Psychiatry

## 2024-09-13 DIAGNOSIS — F419 Anxiety disorder, unspecified: Secondary | ICD-10-CM

## 2024-09-13 DIAGNOSIS — F331 Major depressive disorder, recurrent, moderate: Secondary | ICD-10-CM

## 2024-09-21 ENCOUNTER — Encounter (HOSPITAL_COMMUNITY): Payer: Self-pay | Admitting: Psychiatry

## 2024-09-21 ENCOUNTER — Telehealth (HOSPITAL_COMMUNITY): Admitting: Psychiatry

## 2024-09-21 VITALS — Wt 115.0 lb

## 2024-09-21 DIAGNOSIS — F331 Major depressive disorder, recurrent, moderate: Secondary | ICD-10-CM | POA: Diagnosis not present

## 2024-09-21 DIAGNOSIS — F419 Anxiety disorder, unspecified: Secondary | ICD-10-CM

## 2024-09-21 MED ORDER — SERTRALINE HCL 100 MG PO TABS: 150.0000 mg | ORAL_TABLET | Freq: Every day | ORAL | 2 refills | Status: AC

## 2024-09-21 NOTE — Progress Notes (Signed)
 [facility] MD Virtual Progress Note   Patient Location: Home Provider Location: Home Office  I connect with patient by video and verified that I am speaking with correct person by using two identifiers. I discussed the limitations of evaluation and management by telemedicine and the availability of in person appointments. I also discussed with the patient that there may be a patient responsible charge related to this service. The patient expressed understanding and agreed to proceed.  Vicki Mcmillan [id number] 53 y.o.  09/21/2024 10:44 AM  History of Present Illness:  Patient is evaluated by video session.  She is doing well on Zoloft  150.  She reported her anxiety and depression is stable.  Recently she got sick and given prednisone  but she is no longer taking after feeling better.  She is also hoping to see her son and 73-month-old grandson on Christmas.  Since she connected again with the son she is feeling very happy and relief.  She denies any hopelessness or worthlessness.  She denies any suicidal thoughts.  She denies any panic attack.  She is on disability and now looking into if she can file for divorce as it has been almost a year.  Patient told the legal aid that help her disability may able to help her to get divorced.  Patient lives with her mother.  Patient denies any tremors, shakes or any EPS.  Her appetite is fair.  She is taking Topamax  for her headaches.  Patient like to keep the current medication.  She denies any drinking or using any illegal substances.  Past Psychiatric History: No h/o suicidal attempt or inpatient treatment.  Saw psychiatrist after Chantix caused increased anxiety, hallucination and memory impairment.  Saw Dr. Gracia at [facility] and given Zoloft  Abilify with good response.  PCP prescribed Xanax . Took Klonopin  for a while.   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;  cervical cancer   Complication of anesthesia    COPD (chronic obstructive pulmonary disease) (HCC)    Depression    GERD (gastroesophageal reflux disease)    Headache    Myocardial infarction (HCC)    Panic attacks    PONV (postoperative nausea and vomiting)     Outpatient Encounter Medications as of 09/21/2024  Medication Sig   albuterol  (VENTOLIN  HFA) 108 (90 Base) MCG/ACT inhaler INHALE 1-2 PUFFS BY MOUTH EVERY 6 HOURS AS NEEDED FOR WHEEZE OR SHORTNESS OF BREATH   Budeson-Glycopyrrol-Formoterol (BREZTRI  AEROSPHERE) 160-9-4.8 MCG/ACT AERO Inhale 2 puffs into the lungs in the morning and at bedtime.   Budeson-Glycopyrrol-Formoterol (BREZTRI  AEROSPHERE) 160-9-4.8 MCG/ACT AERO Inhale 2 puffs into the lungs in the morning and at bedtime.   guaiFENesin  (MUCINEX ) 600 MG 12 hr tablet Take 1 tablet (600 mg total) by mouth 2 (two) times daily as needed for to loosen phlegm.   ibuprofen  (ADVIL ) 200 MG tablet Take 200 mg by mouth every 6 (six) hours as needed for headache.   ipratropium-albuterol  (DUONEB) 0.5-2.5 (3) MG/3ML SOLN Take 3 mLs by nebulization 2 (two) times daily as needed (asthma).   loratadine  (CLARITIN ) 10 MG tablet Take 10 mg by mouth daily.   montelukast  (SINGULAIR ) 10 MG tablet Take 1 tablet (10 mg total) by mouth at bedtime.   pantoprazole  (PROTONIX ) 40 MG tablet Take 1 tablet (40 mg total) by mouth daily.   predniSONE  (DELTASONE ) 10 MG tablet Prednisone  taper; 10 mg tablets: 4 tabs  x 2 days, 3 tabs x 2 days, 2 tabs x 2 days 1 tab x 2 days then stop.   prochlorperazine (COMPAZINE) 5 MG tablet Take 5 mg by mouth every 8 (eight) hours as needed for vomiting or nausea.   rizatriptan  (MAXALT -MLT) 5 MG disintegrating tablet Take 5 mg by mouth as needed for migraine.   sertraline  (ZOLOFT ) 100 MG tablet Take 1.5 tablets (150 mg total) by mouth daily.   topiramate  (TOPAMAX ) 50 MG tablet Take 50 mg by mouth at bedtime.   TRELEGY ELLIPTA  200-62.5-25 MCG/ACT AEPB INHALE 1 PUFF EVERY DAY    No facility-administered encounter medications on file as of 09/21/2024.    No results found for this or any previous visit (from the past 2160 hours).   Psychiatric Specialty Exam: Physical Exam  Review of Systems  Weight 115 lb (52.2 kg).There is no height or weight on file to calculate BMI.  General Appearance: Casual  Eye Contact:  Good  Speech:  Clear and Coherent  Volume:  Normal  Mood:  Euthymic  Affect:  Appropriate  Thought Process:  Goal Directed  Orientation:  Full (Time, Place, and Person)  Thought Content:  Logical  Suicidal Thoughts:  No  Homicidal Thoughts:  No  Memory:  Immediate;   Good Recent;   Good Remote;   Good  Judgement:  Good  Insight:  Good  Psychomotor Activity:  Normal  Concentration:  Concentration: Good and Attention Span: Good  Recall:  Good  Fund of Knowledge:  Good  Language:  Good  Akathisia:  No  Handed:  Right  AIMS (if indicated):     Assets:  Communication Skills Desire for Improvement Housing Social Support Transportation  ADL's:  Intact  Cognition:  WNL  Sleep:  ok       12/10/2021    4:03 PM 10/09/2021   11:05 AM  Depression screen PHQ 2/9  Decreased Interest 0 3  Down, Depressed, Hopeless 0 1  PHQ - 2 Score 0 4  Altered sleeping 0 1  Tired, decreased energy 0 3  Change in appetite 0 0  Feeling bad or failure about yourself  0 0  Trouble concentrating 0 0  Moving slowly or fidgety/restless 0 0  Suicidal thoughts 0 0  PHQ-9 Score 0  8   Difficult doing work/chores Not difficult at all Not difficult at all     Data saved with a previous flowsheet row definition    Assessment/Plan: MDD (major depressive disorder), recurrent episode, moderate (HCC) - Plan: sertraline  (ZOLOFT ) 100 MG tablet  Anxiety - Plan: sertraline  (ZOLOFT ) 100 MG tablet  Patient is 52 year old Caucasian female with history of COPD, GERD, headache, major depressive disorder and anxiety disorder.  She is on disability because of COPD.   Discussed current medication and patient like to keep the current Zoloft  150 which is helping her depression and anxiety.  She is looking forward to see her son and 45-month-old grandson on holidays.  Discussed medication side effects and benefits.  Recommend to call back if she has any question or any concern.  Follow-up in 3 months.  Patient has a wellness check in few days with her PCP.   Follow Up Instructions:     I discussed the assessment and treatment plan with the patient. The patient was provided an opportunity to ask questions and all were answered. The patient agreed with the plan and demonstrated an understanding of the instructions.   The patient was advised to call back or seek  an in-person evaluation if the symptoms worsen or if the condition fails to improve as anticipated.    Collaboration of Care: Other provider involved in patient's care AEB notes are available in epic to review  Patient/Guardian was advised Release of Information must be obtained prior to any record release in order to collaborate their care with an outside provider. Patient/Guardian was advised if they have not already done so to contact the registration department to sign all necessary forms in order for us  to release information regarding their care.   Consent: Patient/Guardian gives verbal consent for treatment and assignment of benefits for services provided during this visit. Patient/Guardian expressed understanding and agreed to proceed.     Total encounter time 13 minutes which includes face-to-face time, chart reviewed, care coordination, order entry and documentation during this encounter.   Note: This document was prepared by [employer] voice dictation technology and any errors that results from this process are unintentional.    Leni ONEIDA Client, MD 09/21/2024

## 2024-10-06 ENCOUNTER — Other Ambulatory Visit

## 2024-10-12 ENCOUNTER — Other Ambulatory Visit: Payer: Self-pay | Admitting: Primary Care

## 2024-10-14 ENCOUNTER — Other Ambulatory Visit: Payer: Self-pay

## 2024-10-14 ENCOUNTER — Emergency Department (HOSPITAL_BASED_OUTPATIENT_CLINIC_OR_DEPARTMENT_OTHER)

## 2024-10-14 ENCOUNTER — Emergency Department (HOSPITAL_BASED_OUTPATIENT_CLINIC_OR_DEPARTMENT_OTHER)
Admission: EM | Admit: 2024-10-14 | Discharge: 2024-10-14 | Disposition: A | Discharge status: Home / Self Care | Attending: Emergency Medicine | Admitting: Emergency Medicine

## 2024-10-14 DIAGNOSIS — J441 Chronic obstructive pulmonary disease with (acute) exacerbation: Secondary | ICD-10-CM | POA: Insufficient documentation

## 2024-10-14 LAB — BASIC METABOLIC PANEL WITH GFR
Anion gap: 14 (ref 5–15)
BUN: 23 mg/dL — ABNORMAL HIGH (ref 6–20)
CO2: 24 mmol/L (ref 22–32)
Calcium: 9.6 mg/dL (ref 8.9–10.3)
Chloride: 104 mmol/L (ref 98–111)
Creatinine, Ser: 1.05 mg/dL — ABNORMAL HIGH (ref 0.44–1.00)
GFR, Estimated: >60 mL/min (ref >60)
Glucose, Bld: 147 mg/dL — ABNORMAL HIGH (ref 70–99)
Potassium: 4.1 mmol/L (ref 3.5–5.1)
Sodium: 143 mmol/L (ref 135–145)

## 2024-10-14 LAB — TROPONIN T, HIGH SENSITIVITY: Troponin T High Sensitivity: <15 ng/L (ref 0–19)

## 2024-10-14 LAB — RESP PANEL BY RT-PCR (RSV, FLU A&B, COVID)  RVPGX2
Influenza A by PCR: POSITIVE — AB
Influenza B by PCR: NEGATIVE
Resp Syncytial Virus by PCR: NEGATIVE
SARS Coronavirus 2 by RT PCR: NEGATIVE

## 2024-10-14 LAB — CBC
HCT: 40.5 % (ref 36.0–46.0)
Hemoglobin: 13.6 g/dL (ref 12.0–15.0)
MCH: 30.9 pg (ref 26.0–34.0)
MCHC: 33.6 g/dL (ref 30.0–36.0)
MCV: 92 fL (ref 80.0–100.0)
Platelets: 192 K/uL (ref 150–400)
RBC: 4.4 MIL/uL (ref 3.87–5.11)
RDW: 13.3 % (ref 11.5–15.5)
WBC: 3.8 K/uL — ABNORMAL LOW (ref 4.0–10.5)
nRBC: 0 % (ref 0.0–0.2)

## 2024-10-14 LAB — PRO BRAIN NATRIURETIC PEPTIDE: Pro Brain Natriuretic Peptide: 125 pg/mL (ref <300.0)

## 2024-10-14 LAB — D-DIMER, QUANTITATIVE: D-Dimer, Quant: 0.46 ug{FEU}/mL (ref 0.00–0.50)

## 2024-10-14 MED ORDER — METHYLPREDNISOLONE SODIUM SUCC 125 MG IJ SOLR: 125.0000 mg | Freq: Once | INTRAMUSCULAR | Status: DC

## 2024-10-14 MED ORDER — IPRATROPIUM-ALBUTEROL 0.5-2.5 (3) MG/3ML IN SOLN: 3.0000 mL | Freq: Two times a day (BID) | RESPIRATORY_TRACT | 2 refills | Status: AC | PRN

## 2024-10-14 MED ORDER — PREDNISONE 20 MG PO TABS: 60.0000 mg | ORAL_TABLET | Freq: Every day | ORAL | 0 refills | Status: DC

## 2024-10-14 MED ORDER — IPRATROPIUM-ALBUTEROL 0.5-2.5 (3) MG/3ML IN SOLN
3.0000 mL | Freq: Once | RESPIRATORY_TRACT | Status: AC
  Administered 2024-10-14: 3 mL via RESPIRATORY_TRACT
  Filled 2024-10-14: qty 3

## 2024-10-14 MED ORDER — PREDNISONE 50 MG PO TABS
60.0000 mg | ORAL_TABLET | Freq: Once | ORAL | Status: AC
  Administered 2024-10-14: 60 mg via ORAL
  Filled 2024-10-14: qty 1

## 2024-10-14 NOTE — ED Provider Notes (Signed)
 [facility] EMERGENC[facility] DEPARTMENT AT [facility] Provider Note   CSN: [id number] Arrival date & time: 10/14/24  2010     Patient presents with: Shortness of Breath   Vicki Mcmillan is a 53 y.o. female.   Patient here with ongoing shortness of breath cough.  History of COPD.  She was put on amoxicillin and Medrol  Dosepak 2 days ago still feels symptomatic.  She denies any weakness numbness tingling but when she does walk she feels little bit weak.  She has chest pain with coughing.  She denies any fever.  She has not been tested for flu or COVID.  She does not wear oxygen  at home.    The history is provided by the patient.       Prior to Admission medications  Medication Sig Start Date End Date Taking? Authorizing Provider  predniSONE  (DELTASONE ) 20 MG tablet Take 3 tablets (60 mg total) by mouth daily for 5 days. 10/14/24 10/19/24 Yes Daishon Chui, DO  albuterol  (VENTOLIN  HFA) 108 (90 Base) MCG/ACT inhaler INHALE 1-2 PUFFS BY MOUTH EVERY 6 HOURS AS NEEDED FOR WHEEZE OR SHORTNESS OF BREATH 07/28/24   Shelah Lamar RAMAN, MD  BREZTRI  AEROSPHERE 160-9-4.8 MCG/ACT AERO inhaler INHALE 2 PUFFS INTO THE LUNGS IN THE MORNING AND AT BEDTIME. 10/12/24   Hope Almarie ORN, NP  Budeson-Glycopyrrol-Formoterol (BREZTRI  AEROSPHERE) 160-9-4.8 MCG/ACT AERO Inhale 2 puffs into the lungs in the morning and at bedtime. 11/11/23   Hope Almarie ORN, NP  Budeson-Glycopyrrol-Formoterol (BREZTRI  AEROSPHERE) 160-9-4.8 MCG/ACT AERO Inhale 2 puffs into the lungs in the morning and at bedtime. 11/30/23   Hope Almarie ORN, NP  guaiFENesin  (MUCINEX ) 600 MG 12 hr tablet Take 1 tablet (600 mg total) by mouth 2 (two) times daily as needed for to loosen phlegm. 11/11/23   Hope Almarie ORN, NP  ibuprofen  (ADVIL ) 200 MG tablet Take 200 mg by mouth every 6 (six) hours as needed for headache.    [provider]  ipratropium-albuterol  (DUONEB) 0.5-2.5 (3) MG/3ML SOLN Take 3 mLs by nebulization 2 (two)  times daily as needed (asthma). 10/14/24 11/13/24  Juline Sanderford, DO  loratadine  (CLARITIN ) 10 MG tablet Take 10 mg by mouth daily.    [provider]  montelukast  (SINGULAIR ) 10 MG tablet Take 1 tablet (10 mg total) by mouth at bedtime. 11/11/23   Hope Almarie ORN, NP  pantoprazole  (PROTONIX ) 40 MG tablet Take 1 tablet (40 mg total) by mouth daily. 11/17/23   McMichael, Nestor HERO, PA-C  prochlorperazine (COMPAZINE) 5 MG tablet Take 5 mg by mouth every 8 (eight) hours as needed for vomiting or nausea.    [provider]  rizatriptan  (MAXALT -MLT) 5 MG disintegrating tablet Take 5 mg by mouth as needed for migraine. 04/17/23   Arvil Fonda CROME, NP  sertraline  (ZOLOFT ) 100 MG tablet Take 1.5 tablets (150 mg total) by mouth daily. 09/21/24   Arfeen, Leni DASEN, MD  topiramate  (TOPAMAX ) 50 MG tablet Take 50 mg by mouth at bedtime.    [provider]  TRELEGY ELLIPTA  200-62.5-25 MCG/ACT AEPB INHALE 1 PUFF EVERY DAY 04/20/23   Icard, Adine CROME, DO    Allergies: Hydrocodone-acetaminophen , Tomato, Bupropion, Oxycodone, and Varenicline    Review of Systems  Updated Vital Signs BP 112/79   Pulse (!) 107   Temp 98.6 F (37 C) (Temporal)   Resp (!) 23   Ht 5' 2 (1.575 m)   Wt 49.9 kg   SpO2 94%   BMI 20.12 kg/m   Physical  Exam Vitals and nursing note reviewed.  Constitutional:      General: She is not in acute distress.    Appearance: She is well-developed. She is not ill-appearing.  HENT:     Head: Normocephalic and atraumatic.     Mouth/Throat:     Mouth: Mucous membranes are moist.  Eyes:     Extraocular Movements: Extraocular movements intact.     Conjunctiva/sclera: Conjunctivae normal.     Pupils: Pupils are equal, round, and reactive to light.  Cardiovascular:     Rate and Rhythm: Normal rate and regular rhythm.     Pulses: Normal pulses.     Heart sounds: Normal heart sounds. No murmur heard. Pulmonary:     Effort: Pulmonary effort is normal. No  respiratory distress.     Breath sounds: Wheezing present.  Abdominal:     Palpations: Abdomen is soft.     Tenderness: There is no abdominal tenderness.  Musculoskeletal:        General: No swelling. Normal range of motion.     Cervical back: Normal range of motion and neck supple.     Right lower leg: No edema.     Left lower leg: No edema.  Skin:    General: Skin is warm and dry.     Capillary Refill: Capillary refill takes less than 2 seconds.  Neurological:     General: No focal deficit present.     Mental Status: She is alert.  Psychiatric:        Mood and Affect: Mood normal.     (all labs ordered are listed, but only abnormal results are displayed) Labs Reviewed  BASIC METABOLIC PANEL WITH GFR - Abnormal; Notable for the following components:      Result Value   Glucose, Bld 147 (*)    BUN 23 (*)    Creatinine, Ser 1.05 (*)    All other components within normal limits  CBC - Abnormal; Notable for the following components:   WBC 3.8 (*)    All other components within normal limits  RESP PANEL BY RT-PCR (RSV, FLU A&B, COVID)  [id number]  PRO BRAIN NATRIURETIC PEPTIDE  D-DIMER, QUANTITATIVE  TROPONIN T, HIGH SENSITIVITY    EKG: EKG Interpretation Date/Time:  Friday October 14 2024 20:21:29 EST Ventricular Rate:  89 PR Interval:  128 QRS Duration:  82 QT Interval:  370 QTC Calculation: 450 R Axis:   87  Text Interpretation: Normal sinus rhythm Right atrial enlargement Nonspecific ST abnormality Abnormal ECG When compared with ECG of 04-Nov-2023 18:37, PREVIOUS ECG IS PRESENT Confirmed by Ruthe Cornet [phone]) on 10/14/2024 8:23:44 PM  Radiology: [facility] Chest Portable 1 View Result Date: 10/14/2024 EXAM: 1 VIEW(S) XRAY OF THE CHEST 10/14/2024 08:42:00 PM COMPARISON: CT chest 08/11/2024. CLINICAL HISTORY: sob sob FINDINGS: LUNGS AND PLEURA: Nipple shadow overlying the left lower lobe. No focal pulmonary opacity. No pleural effusion. No pneumothorax. HEART AND  MEDIASTINUM: No acute abnormality of the cardiac and mediastinal silhouettes. BONES AND SOFT TISSUES: No acute osseous abnormality. IMPRESSION: 1. No acute cardiopulmonary process. Nipple shadow overlying the left lower lobe, benign etiology. Electronically signed by: Pinkie Pebbles MD 10/14/2024 08:46 PM EST RP Workstation: [id number]     Procedures   Medications Ordered in the ED  predniSONE  (DELTASONE ) tablet 60 mg (has no administration in time range)  ipratropium-albuterol  (DUONEB) 0.5-2.5 (3) MG/3ML nebulizer solution 3 mL (3 mLs Nebulization Given 10/14/24 2054)  Medical Decision Making Amount and/or Complexity of Data Reviewed Labs: ordered. Radiology: ordered.  Risk Prescription drug management.   Vicki Mcmillan is here with shortness of breath cough.  Normal vitals.  No fever.  Wheezing on exam.  No signs of volume overload.  Differential diagnosis likely ongoing COPD exacerbation.  She is put on amoxicillin and Medrol  Dosepak a couple days ago with mild improvement.  Differential includes COPD exacerbation less likely ACS PE volume overload.  Could be pneumonia.  Could be viral process.  Will check CBC BMP troponin proBNP D-dimer chest x-ray viral swab.  Will give breathing treatment and Solu-Medrol  and reevaluate.  Overall lab work shows no significant leukocytosis anemia or electrolyte abnormality.  Troponin normal.  D-dimer normal.  proBNP normal.  Chest x-ray no evidence of pneumonia pneumothorax.  Overall I suspect COPD exacerbation.  She ambulated without any hypoxia.  She has a pulse oximeter at home.  She feels no role for discharge at this time.  Will have her do prednisone  burst of 60 mg daily for 5 days and if she still symptomatic can finish her steroid taper at that time.  But ultimately I think hopefully with increased dose of steroid she will feel improved.  She will check pulse ox at home.  She will return if symptoms  worsen.  Discharged in good condition.  This chart was dictated using voice recognition software.  Despite best efforts to proofread,  errors can occur which can change the documentation meaning.      Final diagnoses:  COPD exacerbation [facility])    ED Discharge Orders          Ordered    predniSONE  (DELTASONE ) 20 MG tablet  Daily        10/14/24 2113    ipratropium-albuterol  (DUONEB) 0.5-2.5 (3) MG/3ML SOLN  2 times daily PRN       Note to Pharmacy: [id number]   10/14/24 2114               Ruthe Cornet, DO 10/14/24 2115

## 2024-10-14 NOTE — ED Triage Notes (Signed)
 Pt POV reporting persistent SOB, called PCP, prescribed steroids and abx 12/10 without improvement, hx COPD.

## 2024-10-14 NOTE — Discharge Instructions (Addendum)
 Overall I would do 60 mg of prednisone  for the next several days instead of doing your steroid taper.  Can consider finishing steroid taper if you are still feeling symptomatic after your steroid burst.  Continue antibiotics.  Return if symptoms worsen as we discussed.  I recommend using your albuterol  inhaler every 4-6 hours as needed.  Continue to check pulse ox at home.

## 2024-10-15 ENCOUNTER — Other Ambulatory Visit: Payer: Self-pay | Admitting: Gastroenterology

## 2024-10-15 ENCOUNTER — Other Ambulatory Visit: Payer: Self-pay | Admitting: Primary Care

## 2024-10-19 ENCOUNTER — Emergency Department (HOSPITAL_BASED_OUTPATIENT_CLINIC_OR_DEPARTMENT_OTHER): Admitting: Radiology

## 2024-10-19 ENCOUNTER — Emergency Department (HOSPITAL_BASED_OUTPATIENT_CLINIC_OR_DEPARTMENT_OTHER)

## 2024-10-19 ENCOUNTER — Encounter (HOSPITAL_BASED_OUTPATIENT_CLINIC_OR_DEPARTMENT_OTHER): Payer: Self-pay

## 2024-10-19 ENCOUNTER — Inpatient Hospital Stay (HOSPITAL_BASED_OUTPATIENT_CLINIC_OR_DEPARTMENT_OTHER)
Admission: EM | Admit: 2024-10-19 | Discharge: 2024-10-24 | DRG: 193 | Disposition: A | Discharge status: Home / Self Care

## 2024-10-19 DIAGNOSIS — Z8541 Personal history of malignant neoplasm of cervix uteri: Secondary | ICD-10-CM

## 2024-10-19 DIAGNOSIS — F410 Panic disorder [episodic paroxysmal anxiety] without agoraphobia: Secondary | ICD-10-CM | POA: Diagnosis present

## 2024-10-19 DIAGNOSIS — J1000 Influenza due to other identified influenza virus with unspecified type of pneumonia: Principal | ICD-10-CM | POA: Diagnosis present

## 2024-10-19 DIAGNOSIS — B965 Pseudomonas (aeruginosa) (mallei) (pseudomallei) as the cause of diseases classified elsewhere: Secondary | ICD-10-CM | POA: Diagnosis present

## 2024-10-19 DIAGNOSIS — Z7951 Long term (current) use of inhaled steroids: Secondary | ICD-10-CM

## 2024-10-19 DIAGNOSIS — J440 Chronic obstructive pulmonary disease with acute lower respiratory infection: Secondary | ICD-10-CM | POA: Diagnosis present

## 2024-10-19 DIAGNOSIS — Z1152 Encounter for screening for COVID-19: Secondary | ICD-10-CM

## 2024-10-19 DIAGNOSIS — K219 Gastro-esophageal reflux disease without esophagitis: Secondary | ICD-10-CM | POA: Diagnosis not present

## 2024-10-19 DIAGNOSIS — R519 Headache, unspecified: Secondary | ICD-10-CM | POA: Diagnosis not present

## 2024-10-19 DIAGNOSIS — E876 Hypokalemia: Secondary | ICD-10-CM | POA: Diagnosis not present

## 2024-10-19 DIAGNOSIS — F32A Depression, unspecified: Secondary | ICD-10-CM | POA: Diagnosis not present

## 2024-10-19 DIAGNOSIS — Z85828 Personal history of other malignant neoplasm of skin: Secondary | ICD-10-CM

## 2024-10-19 DIAGNOSIS — F17210 Nicotine dependence, cigarettes, uncomplicated: Secondary | ICD-10-CM | POA: Diagnosis present

## 2024-10-19 DIAGNOSIS — Z716 Tobacco abuse counseling: Secondary | ICD-10-CM

## 2024-10-19 DIAGNOSIS — J441 Chronic obstructive pulmonary disease with (acute) exacerbation: Secondary | ICD-10-CM | POA: Diagnosis present

## 2024-10-19 DIAGNOSIS — J101 Influenza due to other identified influenza virus with other respiratory manifestations: Secondary | ICD-10-CM

## 2024-10-19 DIAGNOSIS — Z720 Tobacco use: Secondary | ICD-10-CM | POA: Diagnosis not present

## 2024-10-19 DIAGNOSIS — Z825 Family history of asthma and other chronic lower respiratory diseases: Secondary | ICD-10-CM

## 2024-10-19 DIAGNOSIS — J439 Emphysema, unspecified: Secondary | ICD-10-CM | POA: Diagnosis present

## 2024-10-19 DIAGNOSIS — J9601 Acute respiratory failure with hypoxia: Secondary | ICD-10-CM | POA: Diagnosis not present

## 2024-10-19 DIAGNOSIS — Z79899 Other long term (current) drug therapy: Secondary | ICD-10-CM

## 2024-10-19 DIAGNOSIS — I252 Old myocardial infarction: Secondary | ICD-10-CM

## 2024-10-19 LAB — CBC WITH DIFFERENTIAL/PLATELET
Abs Immature Granulocytes: 0.09 K/uL — ABNORMAL HIGH (ref 0.00–0.07)
Basophils Absolute: 0 K/uL (ref 0.0–0.1)
Basophils Relative: 0 %
Eosinophils Absolute: 0.1 K/uL (ref 0.0–0.5)
Eosinophils Relative: 1 %
HCT: 37.2 % (ref 36.0–46.0)
Hemoglobin: 12.9 g/dL (ref 12.0–15.0)
Immature Granulocytes: 1 %
Lymphocytes Relative: 29 %
Lymphs Abs: 2.2 K/uL (ref 0.7–4.0)
MCH: 31.1 pg (ref 26.0–34.0)
MCHC: 34.7 g/dL (ref 30.0–36.0)
MCV: 89.6 fL (ref 80.0–100.0)
Monocytes Absolute: 0.4 K/uL (ref 0.1–1.0)
Monocytes Relative: 6 %
Neutro Abs: 4.8 K/uL (ref 1.7–7.7)
Neutrophils Relative %: 63 %
Platelets: 257 K/uL (ref 150–400)
RBC: 4.15 MIL/uL (ref 3.87–5.11)
RDW: 13.2 % (ref 11.5–15.5)
WBC: 7.5 K/uL (ref 4.0–10.5)
nRBC: 0 % (ref 0.0–0.2)

## 2024-10-19 LAB — COMPREHENSIVE METABOLIC PANEL WITH GFR
ALT: 23 U/L (ref 0–44)
AST: 14 U/L — ABNORMAL LOW (ref 15–41)
Albumin: 4.1 g/dL (ref 3.5–5.0)
Alkaline Phosphatase: 53 U/L (ref 38–126)
Anion gap: 11 (ref 5–15)
BUN: 24 mg/dL — ABNORMAL HIGH (ref 6–20)
CO2: 25 mmol/L (ref 22–32)
Calcium: 9.2 mg/dL (ref 8.9–10.3)
Chloride: 108 mmol/L (ref 98–111)
Creatinine, Ser: 0.85 mg/dL (ref 0.44–1.00)
GFR, Estimated: >60 mL/min (ref >60)
Glucose, Bld: 93 mg/dL (ref 70–99)
Potassium: 2.9 mmol/L — ABNORMAL LOW (ref 3.5–5.1)
Sodium: 144 mmol/L (ref 135–145)
Total Bilirubin: 0.5 mg/dL (ref 0.0–1.2)
Total Protein: 6.7 g/dL (ref 6.5–8.1)

## 2024-10-19 LAB — MAGNESIUM: Magnesium: 2 mg/dL (ref 1.7–2.4)

## 2024-10-19 MED ORDER — ONDANSETRON HCL 4 MG/2ML IJ SOLN: 4.0000 mg | Freq: Four times a day (QID) | INTRAMUSCULAR | Status: AC | PRN

## 2024-10-19 MED ORDER — SERTRALINE HCL 25 MG PO TABS
125.0000 mg | ORAL_TABLET | Freq: Once | ORAL | Status: AC
  Administered 2024-10-19: 18:00:00 125 mg via ORAL
  Filled 2024-10-19: qty 5

## 2024-10-19 MED ORDER — MONTELUKAST SODIUM 10 MG PO TABS
10.0000 mg | ORAL_TABLET | Freq: Every day | ORAL | Status: DC
  Administered 2024-10-20 – 2024-10-23 (×4): 10 mg via ORAL
  Filled 2024-10-19 (×4): qty 1

## 2024-10-19 MED ORDER — ONDANSETRON HCL 4 MG PO TABS: 4.0000 mg | ORAL_TABLET | Freq: Four times a day (QID) | ORAL | Status: AC | PRN

## 2024-10-19 MED ORDER — SERTRALINE HCL 25 MG PO TABS
150.0000 mg | ORAL_TABLET | Freq: Every day | ORAL | Status: DC
  Administered 2024-10-20 – 2024-10-24 (×5): 150 mg via ORAL
  Filled 2024-10-19 (×5): qty 2

## 2024-10-19 MED ORDER — IPRATROPIUM-ALBUTEROL 0.5-2.5 (3) MG/3ML IN SOLN: 6.0000 mL | RESPIRATORY_TRACT | Status: DC

## 2024-10-19 MED ORDER — IPRATROPIUM-ALBUTEROL 0.5-2.5 (3) MG/3ML IN SOLN: 3.0000 mL | RESPIRATORY_TRACT | Status: DC

## 2024-10-19 MED ORDER — MAGNESIUM SULFATE 2 GM/50ML IV SOLN
2.0000 g | Freq: Once | INTRAVENOUS | Status: AC
  Administered 2024-10-19: 15:00:00 2 g via INTRAVENOUS
  Filled 2024-10-19: qty 50

## 2024-10-19 MED ORDER — ALBUTEROL SULFATE (2.5 MG/3ML) 0.083% IN NEBU: 2.5000 mg | INHALATION_SOLUTION | RESPIRATORY_TRACT | Status: DC | PRN

## 2024-10-19 MED ORDER — IPRATROPIUM-ALBUTEROL 0.5-2.5 (3) MG/3ML IN SOLN
6.0000 mL | Freq: Once | RESPIRATORY_TRACT | Status: AC
  Administered 2024-10-19: 13:00:00 6 mL via RESPIRATORY_TRACT
  Filled 2024-10-19: qty 6

## 2024-10-19 MED ORDER — IOHEXOL 350 MG/ML SOLN
100.0000 mL | Freq: Once | INTRAVENOUS | Status: AC | PRN
  Administered 2024-10-19: 15:00:00 75 mL via INTRAVENOUS

## 2024-10-19 MED ORDER — POTASSIUM CHLORIDE CRYS ER 20 MEQ PO TBCR
40.0000 meq | EXTENDED_RELEASE_TABLET | Freq: Once | ORAL | Status: AC
  Administered 2024-10-19: 15:00:00 40 meq via ORAL
  Filled 2024-10-19: qty 2

## 2024-10-19 MED ORDER — KETOROLAC TROMETHAMINE 15 MG/ML IJ SOLN
15.0000 mg | Freq: Once | INTRAMUSCULAR | Status: AC
  Administered 2024-10-19: 16:00:00 15 mg via INTRAVENOUS
  Filled 2024-10-19: qty 1

## 2024-10-19 MED ORDER — IPRATROPIUM-ALBUTEROL 0.5-2.5 (3) MG/3ML IN SOLN
3.0000 mL | RESPIRATORY_TRACT | Status: DC
  Administered 2024-10-19 – 2024-10-20 (×3): 3 mL via RESPIRATORY_TRACT
  Filled 2024-10-19 (×3): qty 3

## 2024-10-19 MED ORDER — METHYLPREDNISOLONE SODIUM SUCC 125 MG IJ SOLR
125.0000 mg | Freq: Once | INTRAMUSCULAR | Status: AC
  Administered 2024-10-19: 15:00:00 125 mg via INTRAVENOUS
  Filled 2024-10-19: qty 2

## 2024-10-19 MED ORDER — PANTOPRAZOLE SODIUM 40 MG PO TBEC
40.0000 mg | DELAYED_RELEASE_TABLET | Freq: Every day | ORAL | Status: DC
  Administered 2024-10-19 – 2024-10-24 (×6): 40 mg via ORAL
  Filled 2024-10-19 (×6): qty 1

## 2024-10-19 MED ORDER — ACETAMINOPHEN 325 MG PO TABS
650.0000 mg | ORAL_TABLET | Freq: Four times a day (QID) | ORAL | Status: DC | PRN
  Administered 2024-10-20 – 2024-10-23 (×2): 650 mg via ORAL
  Filled 2024-10-19 (×2): qty 2

## 2024-10-19 MED ORDER — LORATADINE 10 MG PO TABS
10.0000 mg | ORAL_TABLET | Freq: Every day | ORAL | Status: DC
  Administered 2024-10-19 – 2024-10-24 (×6): 10 mg via ORAL
  Filled 2024-10-19 (×6): qty 1

## 2024-10-19 MED ORDER — TOPIRAMATE 25 MG PO TABS
50.0000 mg | ORAL_TABLET | Freq: Every day | ORAL | Status: DC
  Administered 2024-10-20 – 2024-10-23 (×4): 50 mg via ORAL
  Filled 2024-10-19 (×4): qty 2

## 2024-10-19 MED ORDER — ALBUTEROL SULFATE (2.5 MG/3ML) 0.083% IN NEBU: 2.5000 mg | INHALATION_SOLUTION | RESPIRATORY_TRACT | Status: DC

## 2024-10-19 MED ORDER — BUDESON-GLYCOPYRROL-FORMOTEROL 160-9-4.8 MCG/ACT IN AERO
2.0000 | INHALATION_SPRAY | Freq: Two times a day (BID) | RESPIRATORY_TRACT | Status: DC
  Administered 2024-10-20 – 2024-10-23 (×8): 2 via RESPIRATORY_TRACT
  Filled 2024-10-19: qty 5.9

## 2024-10-19 MED ORDER — ACETAMINOPHEN 650 MG RE SUPP: 650.0000 mg | Freq: Four times a day (QID) | RECTAL | Status: DC | PRN

## 2024-10-19 NOTE — H&P (Signed)
 [facility] History and Physical   Referring Provider: Desiderio PA  Telemedicine Provider: Eldonna MD  Provider Location: [facility]  Patient Location: [facility]  Referring Diagnosis: Acute resp failure w/ hypoxia, COPD  Patient Name and DOB verified: yes  Patient consented to Telemedicine Evaluation:yes  RN virtual assistant: Fonda Mall paramedic Video encounter time and date:10/19/24 1700  Additional Information:  Transfusion: no CPAP/BIPAP: no O2: 2L [place]  Foley: none   Patient: Vicki Mcmillan FMW:[id number] DOB: [birth year] DOA: 10/19/2024 DOS: the patient was seen and examined on 10/19/2024 PCP: Teresa Aldona CROME, NP  Patient coming from: Home  Chief Complaint:  Chief Complaint  Patient presents with   Shortness of Breath   HPI: Waynetta Aliece Honold is a 53 y.o. female with medical history significant of COPD, GERD, Depression, chronic headaches,presenting w/ acute resp failure with hypoxia, COPD exacerbation, Influenza A, hypokalemia. Pt reports mild cough and increased WOB roughly 7-10 days ago. Pt was seen on a virtual visit for symptoms. Pt states that she was given an oral antibiotic and oral prednisone  taper with prn neb treatments. Symptoms failed to improve. Patient went to local ER for re-evaluation on 12/12. Pt was diagnosed with influenza A and placed on high dose prednisone  and complete augmentin. Pt states that symptoms still worsened with outpatient treatment course. Pt denies any know sick contacts. No fevers or chills. No significant reported rhinorrhea or nasal congestion. Was smoking 1/2 PPD. Pt states that she has smoked in 2 weeks. Does not wear home O2. Mild pleuritic vs. Lower abdominal pain w/ cough. No reported nausea or vomiting. No reported diarrhea.  Presented to the ER Temp 98.6, HR 60s-80s, RR 20s, BP 100s/90s, Satting in mid 80s w/ ambulation on RA. Transitioned to 2L [place] to keep O2 sats >94%. Noted to be influenza A + on 12/12. WBC 7.5,  hgb 13, plt 257. Cr 0.85. K 2.9. Mag level 2.0. CTA chest without PE or focal infiltrate. + emphysema.  Review of Systems: As mentioned in the history of present illness. All other systems reviewed and are negative. 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; cervical cancer   Complication of anesthesia    COPD (chronic obstructive pulmonary disease) (HCC)    Depression    GERD (gastroesophageal reflux disease)    Headache    Myocardial infarction (HCC)    Panic attacks    PONV (postoperative nausea and vomiting)    Past Surgical History:  Procedure Laterality Date   BRONCHIAL BIOPSY  03/25/2022   Procedure: BRONCHIAL BIOPSIES;  Surgeon: Brenna Adine CROME, DO;  Location: [facility];  Service: Pulmonary;;   BRONCHIAL BRUSHINGS  03/25/2022   Procedure: BRONCHIAL BRUSHINGS;  Surgeon: Brenna Adine CROME, DO;  Location: [facility];  Service: Pulmonary;;   BRONCHIAL NEEDLE ASPIRATION BIOPSY  03/25/2022   Procedure: BRONCHIAL NEEDLE ASPIRATION BIOPSIES;  Surgeon: Brenna Adine CROME, DO;  Location: [facility];  Service: Pulmonary;;   COLONOSCOPY WITH ESOPHAGOGASTRODUODENOSCOPY (EGD)  08/11/2022   TUBAL LIGATION     VIDEO BRONCHOSCOPY WITH RADIAL ENDOBRONCHIAL ULTRASOUND  03/25/2022   Procedure: RADIAL ENDOBRONCHIAL ULTRASOUND;  Surgeon: Brenna Adine CROME, DO;  Location: [facility];  Service: Pulmonary;;   WRIST SURGERY     Social History:  reports that she has been smoking cigarettes. She started smoking about 17 years ago. She has a 4.3 pack-year smoking history. She has never used smokeless  tobacco. She reports that she does not drink alcohol and does not use drugs.  Allergies[1]  Family History  Problem Relation Age of Onset   COPD Father    COPD Sister        Died in her 58s from it.   COPD Maternal Aunt    Colon cancer Neg Hx    Rectal cancer Neg Hx    Stomach cancer Neg Hx    Esophageal cancer Neg Hx      Prior to Admission medications  Medication Sig Start Date End Date Taking? Authorizing Provider  amoxicillin-clavulanate (AUGMENTIN) 875-125 MG tablet Take 1 tablet by mouth 2 (two) times daily. 10/12/24 10/22/24 Yes [provider]  albuterol  (VENTOLIN  HFA) 108 (90 Base) MCG/ACT inhaler INHALE 1-2 PUFFS BY MOUTH EVERY 6 HOURS AS NEEDED FOR WHEEZE OR SHORTNESS OF BREATH 07/28/24   Shelah Lamar RAMAN, MD  BREZTRI  AEROSPHERE 160-9-4.8 MCG/ACT AERO inhaler INHALE 2 PUFFS INTO THE LUNGS IN THE MORNING AND AT BEDTIME. 10/12/24   Hope Almarie ORN, NP  Budeson-Glycopyrrol-Formoterol  (BREZTRI  AEROSPHERE) 160-9-4.8 MCG/ACT AERO Inhale 2 puffs into the lungs in the morning and at bedtime. 11/11/23   Hope Almarie ORN, NP  Budeson-Glycopyrrol-Formoterol  (BREZTRI  AEROSPHERE) 160-9-4.8 MCG/ACT AERO Inhale 2 puffs into the lungs in the morning and at bedtime. 11/30/23   Hope Almarie ORN, NP  guaiFENesin  (MUCINEX ) 600 MG 12 hr tablet Take 1 tablet (600 mg total) by mouth 2 (two) times daily as needed for to loosen phlegm. 11/11/23   Hope Almarie ORN, NP  ibuprofen  (ADVIL ) 200 MG tablet Take 200 mg by mouth every 6 (six) hours as needed for headache.    [provider]  ipratropium-albuterol  (DUONEB) 0.5-2.5 (3) MG/3ML SOLN Take 3 mLs by nebulization 2 (two) times daily as needed (asthma). 10/14/24 11/13/24  Curatolo, Adam, DO  loratadine  (CLARITIN ) 10 MG tablet Take 10 mg by mouth daily.    [provider]  montelukast  (SINGULAIR ) 10 MG tablet TAKE 1 TABLET BY MOUTH EVERYDAY AT BEDTIME 10/17/24   Ruthell Lauraine FALCON, NP  pantoprazole  (PROTONIX ) 40 MG tablet Take 1 tablet (40 mg total) by mouth daily. Office visit for further refills 10/17/24   Mollie Pfeiffer M, PA-C  predniSONE  (DELTASONE ) 20 MG tablet Take 3 tablets (60 mg total) by mouth daily for 5 days. 10/14/24 10/19/24  Curatolo, Adam, DO  prochlorperazine (COMPAZINE) 5 MG tablet Take 5 mg by mouth every 8 (eight) hours as needed for  vomiting or nausea.    [provider]  rizatriptan  (MAXALT -MLT) 5 MG disintegrating tablet Take 5 mg by mouth as needed for migraine. 04/17/23   Arvil Fonda CROME, NP  sertraline  (ZOLOFT ) 100 MG tablet Take 1.5 tablets (150 mg total) by mouth daily. 09/21/24   Arfeen, Leni DASEN, MD  topiramate  (TOPAMAX ) 50 MG tablet Take 50 mg by mouth at bedtime.    [provider]  TRELEGY ELLIPTA  200-62.5-25 MCG/ACT AEPB INHALE 1 PUFF EVERY DAY 04/20/23   Brenna Adine CROME, DO    Physical Exam: Vitals:   10/19/24 1415 10/19/24 1430 10/19/24 1600 10/19/24 1643  BP:  104/71 (!) 102/57   Pulse:  69 87   Resp:  18 18   Temp:    98.6 F (37 C)  TempSrc:    Oral  SpO2: (!) 87% 95% 100%    Physical Exam Vitals reviewed. Nursing note reviewed: Physical exam including pulmonary, cardiac, abdominal exam performed by Fonda Mall Paramedic. HENT:     Head: Normocephalic.  Mouth/Throat:     Mouth: Mucous membranes are moist.  Eyes:     Pupils: Pupils are equal, round, and reactive to light.  Cardiovascular:     Rate and Rhythm: Normal rate and regular rhythm.  Pulmonary:     Effort: Pulmonary effort is normal.     Breath sounds: Wheezing present.  Abdominal:     General: Bowel sounds are normal.  Musculoskeletal:        General: Normal range of motion.  Neurological:     General: No focal deficit present.  Psychiatric:        Mood and Affect: Mood normal.     Data Reviewed:  There are no new results to review at this time.  CT Angio Chest PE W and/or Wo Contrast EXAM: CTA CHEST 10/19/2024 03:20:00 PM  TECHNIQUE: CTA of the chest was performed after the administration of intravenous contrast. Without and with IV contrast was administered. 75 mL of iohexol  (OMNIPAQUE ) 350 MG/ML injection 100 mL IOHEXOL  350 MG/ML SOLN was administered. Multiplanar reformatted images are provided for review. MIP images are provided for review. Automated exposure control, iterative  reconstruction, and/or weight based adjustment of the mA/kV was utilized to reduce the radiation dose to as low as reasonably achievable.  COMPARISON: CT chest 08/11/2024.  CLINICAL HISTORY: COPD, SOB, hypoxia -- recent flu, eval occult PNA, r/o PE.  FINDINGS:  PULMONARY ARTERIES: Pulmonary arteries are adequately opacified for evaluation. No acute pulmonary embolus. Main pulmonary artery is normal in caliber.  MEDIASTINUM: The heart and pericardium demonstrate no acute abnormality. There is no acute abnormality of the thoracic aorta.  LYMPH NODES: No mediastinal, hilar or axillary lymphadenopathy.  LUNGS AND PLEURA: Mild emphysema present. No focal lung infiltrate. No pulmonary edema. No evidence of pleural effusion or pneumothorax.  UPPER ABDOMEN: Limited images of the upper abdomen are unremarkable.  SOFT TISSUES AND BONES: No acute bone or soft tissue abnormality.  IMPRESSION: 1. No pulmonary embolism. 2. No focal lung infiltrate. 3. Mild emphysema. Pulmonary emphysema is an independent risk factor for lung cancer. Recommend consideration for evaluation for a low-dose CT lung cancer screening program.  Electronically signed by: Greig Pique MD 10/19/2024 03:38 PM EST RP Workstation: [id number] DG Chest 2 View CLINICAL DATA:  Shortness of breath and recent flu.  EXAM: CHEST - 2 VIEW  COMPARISON:  10/14/2024  FINDINGS: Lungs are adequately inflated without focal airspace consolidation or effusion. No pneumothorax. Cardiomediastinal silhouette and remainder of the exam is unchanged.  IMPRESSION: No active cardiopulmonary disease.  Electronically Signed   By: Toribio Agreste M.D.   On: 10/19/2024 14:31  Lab Results  Component Value Date   WBC 7.5 10/19/2024   HGB 12.9 10/19/2024   HCT 37.2 10/19/2024   MCV 89.6 10/19/2024   PLT 257 10/19/2024   Last metabolic panel Lab Results  Component Value Date   GLUCOSE 93 10/19/2024   NA 144 10/19/2024    K 2.9 (L) 10/19/2024   CL 108 10/19/2024   CO2 25 10/19/2024   BUN 24 (H) 10/19/2024   CREATININE 0.85 10/19/2024   GFRNONAA >60 10/19/2024   CALCIUM 9.2 10/19/2024   PHOS 4.3 11/07/2023   PROT 6.7 10/19/2024   ALBUMIN 4.1 10/19/2024   BILITOT 0.5 10/19/2024   ALKPHOS 53 10/19/2024   AST 14 (L) 10/19/2024   ALT 23 10/19/2024   ANIONGAP 11 10/19/2024    Assessment and Plan: Acute respiratory failure with hypoxia (HCC) COPD Exacerbation  Influenza A  Decompensated resp status  now requiring 2L [place] to keep O2 sats >94% w/ ambulation  Noted increased WOB, cough wheezing x 5-7 days  in setting of recent influenza A diagnosis and emphysema/COPD s/p failed outpatient regimen ( including amoxicillin and prednisone  60mg  daily)  Continue with supplemental O2  High dose IV solumedrol  IV rocephin   Scheduled and prn duonebs  Low threshold to start tamiflu   Discussed continued smoking cessation at length  Monitor    Headache Stable  Continue topamax     GERD (gastroesophageal reflux disease) PPI    Hypokalemia K 2.9 on presentation  Suspect secondary to electrolyte shift in setting of albuterol  use  S/p repletion  Mag level pending  Monitor    Depression Mood stable at present  Cont zoloft         Advance Care Planning:   Code Status: Prior   Consults: None   Family Communication: No family at the bedside   Severity of Illness: The appropriate patient status for this patient is OBSERVATION. Observation status is judged to be reasonable and necessary in order to provide the required intensity of service to ensure the patient's safety. The patient's presenting symptoms, physical exam findings, and initial radiographic and laboratory data in the context of their medical condition is felt to place them at decreased risk for further clinical deterioration. Furthermore, it is anticipated that the patient will be medically stable for discharge from the hospital within 2  midnights of admission.   Author: Elspeth JINNY Masters, MD 10/19/2024 5:03 PM  For on call review [email or web address].      [1]  Allergies Allergen Reactions   Hydrocodone-Acetaminophen  Nausea And Vomiting   Tomato Rash   Bupropion Other (See Comments)    hallucinations   Oxycodone Nausea And Vomiting and Other (See Comments)    Stomach upset   Varenicline Other (See Comments)    hallucination

## 2024-10-19 NOTE — Assessment & Plan Note (Signed)
 K 2.9 on presentation  Suspect secondary to electrolyte shift in setting of albuterol  use  S/p repletion  Mag level pending  Monitor

## 2024-10-19 NOTE — Assessment & Plan Note (Signed)
Stable. Continue topamax.

## 2024-10-19 NOTE — ED Notes (Signed)
 Improvement in WOB following breathing treatment. Respirations 22. SpO2 94%.

## 2024-10-19 NOTE — Assessment & Plan Note (Signed)
 Mood stable at present  Cont zoloft 

## 2024-10-19 NOTE — Assessment & Plan Note (Signed)
 COPD Exacerbation  Influenza A  Decompensated resp status now requiring 2L [place] to keep O2 sats >94% w/ ambulation  Noted increased WOB, cough wheezing x 5-7 days  in setting of recent influenza A diagnosis and emphysema/COPD s/p failed outpatient regimen ( including amoxicillin and prednisone  60mg  daily)  Continue with supplemental O2  High dose IV solumedrol  IV rocephin   Scheduled and prn duonebs  Deferring tamiflu  for now given duration of symptoms- reassess as appropriate  Discussed continued smoking cessation at length  Monitor

## 2024-10-19 NOTE — ED Triage Notes (Signed)
 She states she was seen here recently for shortness of breath COPD--no better. She is ambulatory and short of breath, but not in distress. Met at triage by our R.T.

## 2024-10-19 NOTE — ED Provider Notes (Signed)
 [place] EMERGENCY DEPARTMENT AT [facility] Provider Note   CSN: [id number] Arrival date & time: 10/19/24  1249     Patient presents with: Shortness of Breath   Vicki Mcmillan is a 53 y.o. female.   Patient with history of COPD --presents to the emergency department today for evaluation of continued shortness of breath.  Patient was seen in the emergency department on 10/14/24.  Patient had overall reassuring workup, negative D-dimer, negative troponin, negative BNP, but had a flu test that was positive.  Patient was placed on 60 mg prednisone , was already on Augmentin and steroid taper prior to that visit.  No hypoxia, she was discharged.  Patient reports no improvement despite use of home breathing treatments.  She continues to have wheezing.  She states that she gets really short of breath with ambulation and doing simple tasks such as, fixing a bowl of tuna.  She did have subjective fever and chills but no documented fevers previously.  She has some abdominal pain that is worse with coughing and she thinks that she pulled something.  No runny nose or ear pain.  She has nearly completed her prednisone .       Prior to Admission medications  Medication Sig Start Date End Date Taking? Authorizing Provider  albuterol  (VENTOLIN  HFA) 108 (90 Base) MCG/ACT inhaler INHALE 1-2 PUFFS BY MOUTH EVERY 6 HOURS AS NEEDED FOR WHEEZE OR SHORTNESS OF BREATH 07/28/24   Shelah Lamar RAMAN, MD  BREZTRI  AEROSPHERE 160-9-4.8 MCG/ACT AERO inhaler INHALE 2 PUFFS INTO THE LUNGS IN THE MORNING AND AT BEDTIME. 10/12/24   Hope Almarie ORN, NP  Budeson-Glycopyrrol-Formoterol  (BREZTRI  AEROSPHERE) 160-9-4.8 MCG/ACT AERO Inhale 2 puffs into the lungs in the morning and at bedtime. 11/11/23   Hope Almarie ORN, NP  Budeson-Glycopyrrol-Formoterol  (BREZTRI  AEROSPHERE) 160-9-4.8 MCG/ACT AERO Inhale 2 puffs into the lungs in the morning and at bedtime. 11/30/23   Hope Almarie ORN, NP  guaiFENesin   (MUCINEX ) 600 MG 12 hr tablet Take 1 tablet (600 mg total) by mouth 2 (two) times daily as needed for to loosen phlegm. 11/11/23   Hope Almarie ORN, NP  ibuprofen  (ADVIL ) 200 MG tablet Take 200 mg by mouth every 6 (six) hours as needed for headache.    [provider]  ipratropium-albuterol  (DUONEB) 0.5-2.5 (3) MG/3ML SOLN Take 3 mLs by nebulization 2 (two) times daily as needed (asthma). 10/14/24 11/13/24  Curatolo, Adam, DO  loratadine  (CLARITIN ) 10 MG tablet Take 10 mg by mouth daily.    [provider]  montelukast  (SINGULAIR ) 10 MG tablet TAKE 1 TABLET BY MOUTH EVERYDAY AT BEDTIME 10/17/24   Ruthell Lauraine FALCON, NP  pantoprazole  (PROTONIX ) 40 MG tablet Take 1 tablet (40 mg total) by mouth daily. Office visit for further refills 10/17/24   Mollie Pfeiffer M, PA-C  predniSONE  (DELTASONE ) 20 MG tablet Take 3 tablets (60 mg total) by mouth daily for 5 days. 10/14/24 10/19/24  Curatolo, Adam, DO  prochlorperazine (COMPAZINE) 5 MG tablet Take 5 mg by mouth every 8 (eight) hours as needed for vomiting or nausea.    [provider]  rizatriptan  (MAXALT -MLT) 5 MG disintegrating tablet Take 5 mg by mouth as needed for migraine. 04/17/23   Arvil Fonda CROME, NP  sertraline  (ZOLOFT ) 100 MG tablet Take 1.5 tablets (150 mg total) by mouth daily. 09/21/24   Arfeen, Leni DASEN, MD  topiramate  (TOPAMAX ) 50 MG tablet Take 50 mg by mouth at bedtime.    [provider]  TRELEGY ELLIPTA  200-62.5-25 MCG/ACT  AEPB INHALE 1 PUFF EVERY DAY 04/20/23   Icard, Adine CROME, DO    Allergies: Hydrocodone-acetaminophen , Tomato, Bupropion, Oxycodone, and Varenicline    Review of Systems  Updated Vital Signs BP 105/71 (BP Location: Right Arm)   Pulse 82   Temp 98.4 F (36.9 C) (Oral)   Resp (!) 24   SpO2 99%   Physical Exam Vitals and nursing note reviewed.  Constitutional:      Appearance: She is well-developed.  HENT:     Head: Normocephalic and atraumatic.     Jaw: No trismus.      Right Ear: Tympanic membrane, ear canal and external ear normal.     Left Ear: Tympanic membrane, ear canal and external ear normal.     Nose: Nose normal. No mucosal edema or rhinorrhea.     Mouth/Throat:     Mouth: Mucous membranes are moist. Mucous membranes are not dry. No oral lesions.     Pharynx: Uvula midline. No oropharyngeal exudate, posterior oropharyngeal erythema or uvula swelling.     Tonsils: No tonsillar abscesses.  Eyes:     General:        Right eye: No discharge.        Left eye: No discharge.     Conjunctiva/sclera: Conjunctivae normal.  Cardiovascular:     Rate and Rhythm: Normal rate and regular rhythm.     Heart sounds: Normal heart sounds.  Pulmonary:     Effort: Pulmonary effort is normal. No respiratory distress.     Breath sounds: Decreased breath sounds and wheezing present. No rales.     Comments: Patient speaking fairly well without distress.  No increased work of breathing.  Lung sounds are globally diminished with some scant wheezing. Abdominal:     Palpations: Abdomen is soft.     Tenderness: There is no abdominal tenderness.  Musculoskeletal:     Cervical back: Normal range of motion and neck supple.     Right lower leg: No tenderness. No edema.     Left lower leg: No tenderness. No edema.  Lymphadenopathy:     Cervical: No cervical adenopathy.  Skin:    General: Skin is warm and dry.  Neurological:     Mental Status: She is alert.  Psychiatric:        Mood and Affect: Mood normal.     (all labs ordered are listed, but only abnormal results are displayed) Labs Reviewed  CBC WITH DIFFERENTIAL/PLATELET - Abnormal; Notable for the following components:      Result Value   Abs Immature Granulocytes 0.09 (*)    All other components within normal limits  COMPREHENSIVE METABOLIC PANEL WITH GFR - Abnormal; Notable for the following components:   Potassium 2.9 (*)    BUN 24 (*)    AST 14 (*)    All other components within normal limits   MAGNESIUM     EKG: EKG Interpretation Date/Time:  Wednesday October 19 2024 13:36:40 EST Ventricular Rate:  79 PR Interval:  139 QRS Duration:  108 QT Interval:  387 QTC Calculation: 444 R Axis:   77  Text Interpretation: Sinus rhythm Right atrial enlargement Artifact in lead(s) V3 V4 No significant change since last tracing Confirmed by Doretha Folks ([id number]) on 10/19/2024 1:55:00 PM  Radiology: CT Angio Chest PE W and/or Wo Contrast Result Date: 10/19/2024 EXAM: CTA CHEST 10/19/2024 03:20:00 PM TECHNIQUE: CTA of the chest was performed after the administration of intravenous contrast. Without and with IV contrast was administered. 75 mL  of iohexol  (OMNIPAQUE ) 350 MG/ML injection 100 mL IOHEXOL  350 MG/ML SOLN was administered. Multiplanar reformatted images are provided for review. MIP images are provided for review. Automated exposure control, iterative reconstruction, and/or weight based adjustment of the mA/kV was utilized to reduce the radiation dose to as low as reasonably achievable. COMPARISON: CT chest 08/11/2024. CLINICAL HISTORY: COPD, SOB, hypoxia -- recent flu, eval occult PNA, r/o PE. FINDINGS: PULMONARY ARTERIES: Pulmonary arteries are adequately opacified for evaluation. No acute pulmonary embolus. Main pulmonary artery is normal in caliber. MEDIASTINUM: The heart and pericardium demonstrate no acute abnormality. There is no acute abnormality of the thoracic aorta. LYMPH NODES: No mediastinal, hilar or axillary lymphadenopathy. LUNGS AND PLEURA: Mild emphysema present. No focal lung infiltrate. No pulmonary edema. No evidence of pleural effusion or pneumothorax. UPPER ABDOMEN: Limited images of the upper abdomen are unremarkable. SOFT TISSUES AND BONES: No acute bone or soft tissue abnormality. IMPRESSION: 1. No pulmonary embolism. 2. No focal lung infiltrate. 3. Mild emphysema. Pulmonary emphysema is an independent risk factor for lung cancer. Recommend consideration for  evaluation for a low-dose CT lung cancer screening program. Electronically signed by: Greig Pique MD 10/19/2024 03:38 PM EST RP Workstation: [id number]   DG Chest 2 View Result Date: 10/19/2024 CLINICAL DATA:  Shortness of breath and recent flu. EXAM: CHEST - 2 VIEW COMPARISON:  10/14/2024 FINDINGS: Lungs are adequately inflated without focal airspace consolidation or effusion. No pneumothorax. Cardiomediastinal silhouette and remainder of the exam is unchanged. IMPRESSION: No active cardiopulmonary disease. Electronically Signed   By: Toribio Agreste M.D.   On: 10/19/2024 14:31     Procedures   Medications Ordered in the ED  ipratropium-albuterol  (DUONEB) 0.5-2.5 (3) MG/3ML nebulizer solution 3 mL (3 mLs Nebulization Given 10/19/24 1553)  albuterol  (PROVENTIL ) (2.5 MG/3ML) 0.083% nebulizer solution 2.5 mg (has no administration in time range)  ketorolac  (TORADOL ) 15 MG/ML injection 15 mg (has no administration in time range)  ipratropium-albuterol  (DUONEB) 0.5-2.5 (3) MG/3ML nebulizer solution 6 mL (6 mLs Nebulization Given 10/19/24 1316)  methylPREDNISolone  sodium succinate (SOLU-MEDROL ) 125 mg/2 mL injection 125 mg (125 mg Intravenous Given 10/19/24 1433)  magnesium  sulfate IVPB 2 g 50 mL (0 g Intravenous Stopped 10/19/24 1540)  potassium chloride  SA (KLOR-CON  M) CR tablet 40 mEq (40 mEq Oral Given 10/19/24 1436)  iohexol  (OMNIPAQUE ) 350 MG/ML injection 100 mL (75 mLs Intravenous Contrast Given 10/19/24 1519)   ED Course  Patient seen and examined. History obtained directly from patient.  Reviewed previous ED visit and workup.  Labs/EKG: Ordered CBC, CMP, EKG.  Imaging: Ordered chest x-ray 2 view.  Medications/Fluids: Ordered: Albuterol /Atrovent  Most recent vital signs reviewed and are as follows: BP 105/71 (BP Location: Right Arm)   Pulse 82   Temp 98.4 F (36.9 C) (Oral)   Resp (!) 24   SpO2 99%   Initial impression: Shortness of breath in setting of COPD with recent flu A  diagnosis.  2:25 PM Reassessment performed. Patient appears stable, now on 2 L nasal cannula.  Patient ambulated with significant dyspnea.  Oxygen  saturation dropped into the 80s.  Labs personally reviewed and interpreted including: CBC unremarkable; CMP calcium 2.9 likely exacerbated by recent albuterol  use, otherwise unremarkable.  Imaging personally visualized and interpreted including: Chest x-ray appears hyperinflated, consistent with COPD, no dense infiltrate.   Reviewed pertinent lab work and imaging with patient at bedside. Questions answered.  She is agreeable to admission.  She feels a bit better on oxygen .  Most current vital signs reviewed and  are as follows: BP 103/70   Pulse 66   Temp 98.4 F (36.9 C) (Oral)   Resp 17   SpO2 98%   Plan: Will evaluate for occult pneumonia, rule out PE with CTA given hypoxia with ambulation.  Will give IV Solu-Medrol , IV magnesium .  Will plan for admission to hospital once imaging results.  Patient in agreement.  3:53 PM Reassessment performed. Patient appears stable.  Imaging personally visualized and interpreted including: CT angio of the chest was negative for PE and pneumonia.  Reviewed pertinent lab work and imaging with patient at bedside. Questions answered.   Most current vital signs reviewed and are as follows: BP 104/71   Pulse 69   Temp 98.4 F (36.9 C) (Oral)   Resp 18   SpO2 95%   Plan: Admit to hospital.  Toradol  ordered for headache.  I spoke with Dr. Eldonna,  with [facility] hospitalist, who accepts patient.                                   Medical Decision Making Amount and/or Complexity of Data Reviewed Labs: ordered. Radiology: ordered.  Risk Prescription drug management. Decision regarding hospitalization.   Patient recently treated for COPD exacerbation with Augmentin and prednisone , seen in the emergency department 5 days ago with negative workup for ACS, PE, heart failure.  She has not improved  despite appropriate therapy at home.  Patient hypoxic with ambulation today with significant dyspnea.  Further evaluation today with CT did not demonstrate occult pneumonia or PE.  Recommend admission for hypoxic respiratory failure in the setting of COPD and recent influenza A diagnosis.     Final diagnoses:  Acute hypoxic respiratory failure (HCC)  Chronic obstructive pulmonary disease with emphysema, unspecified emphysema type [facility])  Influenza A    ED Discharge Orders     None          Desiderio Chew, PA-C 10/19/24 1844    Doretha Folks, MD 10/20/24 [phone]

## 2024-10-19 NOTE — ED Notes (Signed)
 Walked patient around nurses station. Patient became SOB and coming back in the room SpO2 dropped to 87. Placed patient back on oxygen  of 2 liters and is sitting in bed. SpO2 is back at 100 at this time. Call bell is in reach.

## 2024-10-19 NOTE — ED Notes (Signed)
 Patient to restroom via wheelchair and portable oxygen . Upon return to room, patient experiencing moderately increased WOB. Patient returned to wall oxygen  and cardiac monitoring. Patient practicing pursed lip breathing, SpO2 95%, bilateral breath sounds diminished. Scheduled nebulizer administered.

## 2024-10-19 NOTE — Assessment & Plan Note (Signed)
 PPI

## 2024-10-20 ENCOUNTER — Other Ambulatory Visit: Payer: Self-pay

## 2024-10-20 DIAGNOSIS — F17210 Nicotine dependence, cigarettes, uncomplicated: Secondary | ICD-10-CM | POA: Diagnosis present

## 2024-10-20 DIAGNOSIS — J1000 Influenza due to other identified influenza virus with unspecified type of pneumonia: Secondary | ICD-10-CM | POA: Diagnosis present

## 2024-10-20 DIAGNOSIS — Z85828 Personal history of other malignant neoplasm of skin: Secondary | ICD-10-CM | POA: Diagnosis not present

## 2024-10-20 DIAGNOSIS — R0602 Shortness of breath: Secondary | ICD-10-CM | POA: Diagnosis present

## 2024-10-20 DIAGNOSIS — F410 Panic disorder [episodic paroxysmal anxiety] without agoraphobia: Secondary | ICD-10-CM | POA: Diagnosis present

## 2024-10-20 DIAGNOSIS — J9601 Acute respiratory failure with hypoxia: Secondary | ICD-10-CM | POA: Diagnosis present

## 2024-10-20 DIAGNOSIS — Z716 Tobacco abuse counseling: Secondary | ICD-10-CM | POA: Diagnosis not present

## 2024-10-20 DIAGNOSIS — F32A Depression, unspecified: Secondary | ICD-10-CM | POA: Diagnosis present

## 2024-10-20 DIAGNOSIS — J441 Chronic obstructive pulmonary disease with (acute) exacerbation: Secondary | ICD-10-CM | POA: Diagnosis present

## 2024-10-20 DIAGNOSIS — Z79899 Other long term (current) drug therapy: Secondary | ICD-10-CM | POA: Diagnosis not present

## 2024-10-20 DIAGNOSIS — Z825 Family history of asthma and other chronic lower respiratory diseases: Secondary | ICD-10-CM | POA: Diagnosis not present

## 2024-10-20 DIAGNOSIS — I252 Old myocardial infarction: Secondary | ICD-10-CM | POA: Diagnosis not present

## 2024-10-20 DIAGNOSIS — B965 Pseudomonas (aeruginosa) (mallei) (pseudomallei) as the cause of diseases classified elsewhere: Secondary | ICD-10-CM | POA: Diagnosis present

## 2024-10-20 DIAGNOSIS — J439 Emphysema, unspecified: Secondary | ICD-10-CM | POA: Diagnosis present

## 2024-10-20 DIAGNOSIS — E876 Hypokalemia: Secondary | ICD-10-CM | POA: Diagnosis present

## 2024-10-20 DIAGNOSIS — J440 Chronic obstructive pulmonary disease with acute lower respiratory infection: Secondary | ICD-10-CM | POA: Diagnosis present

## 2024-10-20 DIAGNOSIS — Z1152 Encounter for screening for COVID-19: Secondary | ICD-10-CM | POA: Diagnosis not present

## 2024-10-20 DIAGNOSIS — Z8541 Personal history of malignant neoplasm of cervix uteri: Secondary | ICD-10-CM | POA: Diagnosis not present

## 2024-10-20 DIAGNOSIS — Z7951 Long term (current) use of inhaled steroids: Secondary | ICD-10-CM | POA: Diagnosis not present

## 2024-10-20 DIAGNOSIS — K219 Gastro-esophageal reflux disease without esophagitis: Secondary | ICD-10-CM | POA: Diagnosis present

## 2024-10-20 DIAGNOSIS — J101 Influenza due to other identified influenza virus with other respiratory manifestations: Secondary | ICD-10-CM | POA: Diagnosis not present

## 2024-10-20 LAB — CBC
HCT: 37.4 % (ref 36.0–46.0)
Hemoglobin: 12.4 g/dL (ref 12.0–15.0)
MCH: 30.5 pg (ref 26.0–34.0)
MCHC: 33.2 g/dL (ref 30.0–36.0)
MCV: 92.1 fL (ref 80.0–100.0)
Platelets: 270 K/uL (ref 150–400)
RBC: 4.06 MIL/uL (ref 3.87–5.11)
RDW: 13.2 % (ref 11.5–15.5)
WBC: 7.5 K/uL (ref 4.0–10.5)
nRBC: 0 % (ref 0.0–0.2)

## 2024-10-20 LAB — BASIC METABOLIC PANEL WITH GFR
Anion gap: 9 (ref 5–15)
BUN: 20 mg/dL (ref 6–20)
CO2: 25 mmol/L (ref 22–32)
Calcium: 9.3 mg/dL (ref 8.9–10.3)
Chloride: 107 mmol/L (ref 98–111)
Creatinine, Ser: 0.78 mg/dL (ref 0.44–1.00)
GFR, Estimated: >60 mL/min (ref >60)
Glucose, Bld: 87 mg/dL (ref 70–99)
Potassium: 3.5 mmol/L (ref 3.5–5.1)
Sodium: 141 mmol/L (ref 135–145)

## 2024-10-20 LAB — RESPIRATORY PANEL BY PCR

## 2024-10-20 LAB — HEPATIC FUNCTION PANEL
ALT: 22 U/L (ref 0–44)
AST: 19 U/L (ref 15–41)
Albumin: 4.1 g/dL (ref 3.5–5.0)
Alkaline Phosphatase: 53 U/L (ref 38–126)
Bilirubin, Direct: 0.2 mg/dL (ref 0.0–0.2)
Indirect Bilirubin: 0.3 mg/dL (ref 0.3–0.9)
Total Bilirubin: 0.5 mg/dL (ref 0.0–1.2)
Total Protein: 6.5 g/dL (ref 6.5–8.1)

## 2024-10-20 LAB — HIV ANTIBODY (ROUTINE TESTING W REFLEX): HIV Screen 4th Generation wRfx: NONREACTIVE

## 2024-10-20 LAB — MAGNESIUM: Magnesium: 2.3 mg/dL (ref 1.7–2.4)

## 2024-10-20 MED ORDER — ALBUTEROL SULFATE (2.5 MG/3ML) 0.083% IN NEBU
2.5000 mg | INHALATION_SOLUTION | RESPIRATORY_TRACT | Status: DC | PRN
  Administered 2024-10-21 – 2024-10-22 (×2): 2.5 mg via RESPIRATORY_TRACT
  Filled 2024-10-20 (×2): qty 3

## 2024-10-20 MED ORDER — BENZONATATE 100 MG PO CAPS: 200.0000 mg | ORAL_CAPSULE | Freq: Three times a day (TID) | ORAL | Status: DC | PRN

## 2024-10-20 MED ORDER — SODIUM CHLORIDE 0.9 % IV SOLN
1.0000 g | Freq: Once | INTRAVENOUS | Status: AC
  Administered 2024-10-20: 03:00:00 1 g via INTRAVENOUS

## 2024-10-20 MED ORDER — IPRATROPIUM-ALBUTEROL 0.5-2.5 (3) MG/3ML IN SOLN: 3.0000 mL | RESPIRATORY_TRACT | Status: DC | PRN

## 2024-10-20 MED ORDER — SODIUM CHLORIDE 0.9 % IV SOLN
1.0000 g | INTRAVENOUS | Status: DC
  Administered 2024-10-20 – 2024-10-21 (×2): 1 g via INTRAVENOUS
  Filled 2024-10-20 (×2): qty 10

## 2024-10-20 MED ORDER — METHYLPREDNISOLONE SODIUM SUCC 125 MG IJ SOLR
120.0000 mg | INTRAMUSCULAR | Status: AC
  Administered 2024-10-20 – 2024-10-21 (×2): 120 mg via INTRAVENOUS
  Filled 2024-10-20 (×2): qty 2

## 2024-10-20 MED ORDER — PREDNISONE 20 MG PO TABS
40.0000 mg | ORAL_TABLET | Freq: Every day | ORAL | Status: DC
  Administered 2024-10-22 – 2024-10-24 (×3): 40 mg via ORAL
  Filled 2024-10-20 (×3): qty 2

## 2024-10-20 MED ORDER — GUAIFENESIN-DM 100-10 MG/5ML PO SYRP: 5.0000 mL | ORAL_SOLUTION | ORAL | Status: DC | PRN

## 2024-10-20 MED ORDER — OSELTAMIVIR PHOSPHATE 30 MG PO CAPS
30.0000 mg | ORAL_CAPSULE | Freq: Two times a day (BID) | ORAL | Status: DC
  Administered 2024-10-20 – 2024-10-21 (×3): 30 mg via ORAL
  Filled 2024-10-20 (×4): qty 1

## 2024-10-20 MED ORDER — IPRATROPIUM-ALBUTEROL 0.5-2.5 (3) MG/3ML IN SOLN
3.0000 mL | RESPIRATORY_TRACT | Status: DC
  Administered 2024-10-20: 09:00:00 3 mL via RESPIRATORY_TRACT
  Filled 2024-10-20: qty 3

## 2024-10-20 MED ORDER — SODIUM CHLORIDE 0.9 % IV SOLN
1.0000 g | INTRAVENOUS | Status: DC
  Filled 2024-10-20: qty 10

## 2024-10-20 MED ORDER — ENOXAPARIN SODIUM 40 MG/0.4ML IJ SOSY
40.0000 mg | PREFILLED_SYRINGE | INTRAMUSCULAR | Status: DC
  Administered 2024-10-20 – 2024-10-24 (×5): 40 mg via SUBCUTANEOUS
  Filled 2024-10-20 (×5): qty 0.4

## 2024-10-20 MED ORDER — IPRATROPIUM-ALBUTEROL 0.5-2.5 (3) MG/3ML IN SOLN
3.0000 mL | Freq: Three times a day (TID) | RESPIRATORY_TRACT | Status: DC
  Administered 2024-10-20: 13:00:00 3 mL via RESPIRATORY_TRACT
  Filled 2024-10-20: qty 3

## 2024-10-20 NOTE — Plan of Care (Signed)

## 2024-10-20 NOTE — Evaluation (Signed)
 Physical Therapy Evaluation and Discharge Patient Details Name: Vicki Mcmillan MRN: [id number] DOB: [birth year] Today's Date: 10/20/2024  History of Present Illness  53 y.o. female presenting 12/17 with acute resp failure with hypoxia, COPD exacerbation, Influenza A, hypokalemia. PMH: COPD, GERD, Depression, chronic headaches.  Clinical Impression  Patient evaluated by Physical Therapy with no further acute PT needs identified. All education has been completed and the patient has no further questions. Independent at working, not working due to disability. She was able to ambulate 100 feet at a mod I level with mod-severe DOE on room air with sats 90-93%. Recovers quickly with seated break at supplemental O2 reapplied at 2L. Sats mid to upper 90s. No evidence of instability or LOB while mobilizing. Encouraged OOB often with staff to avoid complications associated with prolonged bed rest. She has not seen a pulmonologist in a while as her prior specialist retired. States she will follow-up with their office to re-establish a new provider, and may consider pulmonary rehab at that time. See below for any follow-up Physical Therapy or equipment needs. PT is signing off. Thank you for this referral.         If plan is discharge home, recommend the following: Assist for transportation   Can travel by private vehicle        Equipment Recommendations None recommended by PT  Recommendations for Other Services       Functional Status Assessment Patient has had a recent decline in their functional status and demonstrates the ability to make significant improvements in function in a reasonable and predictable amount of time.     Precautions / Restrictions Precautions Recall of Precautions/Restrictions: Intact Precaution/Restrictions Comments: watch O2 Restrictions Weight Bearing Restrictions Per Provider Order: No      Mobility  Bed Mobility Overal bed mobility: Independent                   Transfers Overall transfer level: Independent                      Ambulation/Gait Ambulation/Gait assistance: Modified independent (Device/Increase time) Gait Distance (Feet): 100 Feet Assistive device: None Gait Pattern/deviations: WFL(Within Functional Limits) Gait velocity: dec Gait velocity interpretation: 1.31 - 2.62 ft/sec, indicative of limited community ambulator   General Gait Details: Grossly stable without any evidence of LOB or buckling when ambulating. Pt does become moderate to severely dyspneic, limiting ambulatory distance but feels it is improving since admission. She was able to ambulate with SpO2 90-93% on room air. Educated on breathing techniques and energy conservation.  Stairs            Wheelchair Mobility     Tilt Bed    Modified Rankin (Stroke Patients Only)       Balance Overall balance assessment: No apparent balance deficits (not formally assessed)                                           Pertinent Vitals/Pain Pain Assessment Pain Assessment: No/denies pain    Home Living Family/patient expects to be discharged to:: Private residence Living Arrangements: Parent Available Help at Discharge: Family Type of Home: House Home Access: Stairs to enter Entrance Stairs-Rails: Right Entrance Stairs-Number of Steps: 4   Home Layout: One level Home Equipment: None      Prior Function Prior Level of Function : Independent/Modified Independent  Mobility Comments: ind ADLs Comments: ind, no falls, does not work     Extremity/Trunk Assessment   Upper Extremity Assessment Upper Extremity Assessment: Defer to OT evaluation    Lower Extremity Assessment Lower Extremity Assessment: Overall WFL for tasks assessed       Communication   Communication Communication: No apparent difficulties    Cognition Arousal: Alert Behavior During Therapy: WFL for tasks assessed/performed    PT - Cognitive impairments: No apparent impairments                         Following commands: Intact       Cueing Cueing Techniques: Verbal cues     General Comments General comments (skin integrity, edema, etc.): SpO2 96% at rest on RA and with 2L. 90-93% on RA while ambulating.    Exercises     Assessment/Plan    PT Assessment Patient does not need any further PT services  PT Problem List         PT Treatment Interventions      PT Goals (Current goals can be found in the Care Plan section)  Acute Rehab PT Goals Patient Stated Goal: Get well PT Goal Formulation: All assessment and education complete, DC therapy    Frequency       Co-evaluation               AM-PAC PT 6 Clicks Mobility  Outcome Measure Help needed turning from your back to your side while in a flat bed without using bedrails?: None Help needed moving from lying on your back to sitting on the side of a flat bed without using bedrails?: None Help needed moving to and from a bed to a chair (including a wheelchair)?: None Help needed standing up from a chair using your arms (e.g., wheelchair or bedside chair)?: None Help needed to walk in hospital room?: None Help needed climbing 3-5 steps with a railing? : None 6 Click Score: 24    End of Session Equipment Utilized During Treatment: Oxygen  Activity Tolerance: Patient tolerated treatment well Patient left: in bed;with call bell/phone within reach   PT Visit Diagnosis: Difficulty in walking, not elsewhere classified (R26.2)    Time: 9060-8998 PT Time Calculation (min) (ACUTE ONLY): 22 min   Charges:   PT Evaluation $PT Eval Low Complexity: 1 Low   PT General Charges $$ ACUTE PT VISIT: 1 Visit         Leontine Roads, PT, DPT [facility]  Re[facility] Services Physical Therapist Office: [phone] Website: [email or web address]   Leontine GORMAN Roads 10/20/2024, 12:00 PM

## 2024-10-20 NOTE — Plan of Care (Signed)
 Patient seen and examined at bedside.  Not in distress.  Still requiring oxygen  to maintain sats.  Patient says that her breathing has improved but still feels weak.    Blood pressure is 109/70 pulse 62/min respiration 20/min temperature 97.9.   HEENT anicteric no pallor  Chest bilateral air entry present minimal expiratory wheezes.   Heart S1-S2 heard. Neuro alert awake oriented time place and person moving all extremities.   I reviewed by colleagues Dr. Lyda history and physical orders and labs medications..  Patient admitted for COPD exacerbation in the setting of influenza A.  Has been having symptoms almost for a week now.  Was prescribed antibiotics and prednisone  despite which her symptoms have not improved so presents back to the ER.  Labs also show hypokalemia.  Replacement was ordered repeat metabolic panel is pending.  Will continue with nebulizers steroids antibiotics.  Since patient is hypoxic and has COPD despite symptoms being more than 2 days we will start patient on Tamiflu  given influenza A positive.  Patient agreeable.  Redia Cleaver MD.

## 2024-10-20 NOTE — Progress Notes (Signed)
 Progress Note   Patient: Vicki Mcmillan FMW:[id number] DOB: [birth year] DOA: 10/19/2024     0 DOS: the patient was seen and examined on 10/20/2024   Brief hospital course: HPI: Vicki Mcmillan is a 53 y.o. female with medical history significant of COPD, GERD, Depression, chronic headaches,presenting w/ acute resp failure with hypoxia, COPD exacerbation, Influenza A, hypokalemia. Pt reports mild cough and increased WOB roughly 7-10 days ago. Pt was seen on a virtual visit for symptoms. Pt states that she was given an oral antibiotic and oral prednisone  taper with prn neb treatments. Symptoms failed to improve. Patient went to local ER for re-evaluation on 12/12. Pt was diagnosed with influenza A and placed on high dose prednisone  and complete augmentin. Pt states that symptoms still worsened with outpatient treatment course. Pt denies any know sick contacts. No fevers or chills. No significant reported rhinorrhea or nasal congestion. Was smoking 1/2 PPD. Pt states that she has smoked in 2 weeks. Does not wear home O2. Mild pleuritic vs. Lower abdominal pain w/ cough. No reported nausea or vomiting. No reported diarrhea.  Presented to the ER Temp 98.6, HR 60s-80s, RR 20s, BP 100s/90s, Satting in mid 80s w/ ambulation on RA. Transitioned to 2L [place] to keep O2 sats >94%. Noted to be influenza A + on 12/12. WBC 7.5, hgb 13, plt 257. Cr 0.85. K 2.9. Mag level 2.0. CTA chest without PE or focal infiltrate. + emphysema.  Assessment and Plan: Acute respiratory failure with hypoxia (HCC) COPD Exacerbation  Influenza A  Decompensated resp status now requiring 2L [place] to keep O2 sats >94% w/ ambulation  Noted increased WOB, cough wheezing x 5-7 days  in setting of recent influenza A diagnosis and emphysema/COPD s/p failed outpatient regimen ( including amoxicillin and prednisone  60mg  daily)  Continue with supplemental O2  CXR w/o infiltrate  High dose IV solumedrol- day 2  IV rocephin   Scheduled  and prn duonebs  Tamiflu  added for influenza A coverage  Add on prn duonebs and antitussive agents for resp support   Headache Stable  Continue topamax     GERD (gastroesophageal reflux disease) PPI    Hypokalemia K 2.9 on presentation  Suspect secondary to electrolyte shift in setting of albuterol  use  S/p repletion  Mag level pending  Monitor    Depression Mood stable at present  Cont zoloft           Subjective: Still with some mild to moderate cough and wheezing. No fevers or chills. No chest pain. No abdominal pain or diarrhea.   Physical Exam: Vitals:   10/20/24 0448 10/20/24 0839 10/20/24 0841 10/20/24 1131  BP: 109/70 112/77  100/66  Pulse: 62 65 70 75  Resp: 20 18 18 18   Temp: 97.9 F (36.6 C) 97.7 F (36.5 C)    TempSrc: Oral Oral    SpO2: 97% 97%  98%  Weight:      Height:       Physical Exam Constitutional:      Appearance: She is normal weight.  HENT:     Head: Normocephalic and atraumatic.     Nose: Nose normal.     Mouth/Throat:     Mouth: Mucous membranes are moist.  Eyes:     Pupils: Pupils are equal, round, and reactive to light.  Cardiovascular:     Rate and Rhythm: Normal rate and regular rhythm.  Pulmonary:     Effort: Pulmonary effort is normal.     Breath sounds: Wheezing present.  Abdominal:     General: Bowel sounds are normal.  Musculoskeletal:        General: Normal range of motion.  Skin:    General: Skin is warm.  Neurological:     General: No focal deficit present.  Psychiatric:        Mood and Affect: Mood normal.     Data Reviewed:  There are no new results to review at this time.  Family Communication: No family at the bedside   Disposition: Status is: Observation The patient will require care spanning > 2 midnights and should be moved to inpatient because: Persistent shortness of breath, wheezing, and hypoxia   Planned Discharge Destination: Home    Time spent: >60  minutes  Author: Elspeth JINNY Masters, MD 10/20/2024 12:04 PM  For on call review [email or web address].

## 2024-10-20 NOTE — Progress Notes (Signed)
 Patient admitted by Dr. Eldonna on 10/19/2024 for acute hypoxic respiratory secondary to acute COPD exacerbation in the setting of recent influenza A infection. Was evaluated by Dr. Eldonna again today for appropriateness for [facility], though patient expressed preference for continued stay in hospital for at least one more day. Patient currently still requiring supplemental O2 but down to 1L [place]. Continues to report cough productive of green sputum. Afebrile with no leukocytosis. On IV rocephin , Tamiflu , prednisone  40 mg daily, Breztri , and PRN DuoNebs and albuterol  nebs. Please refer to progress note by Dr. Eldonna for remainder of today's A&P.   Brad Lieurance Al-Sultani, MD [facility] 10/20/2024 9:22 PM

## 2024-10-20 NOTE — Care Management Obs Status (Addendum)
 MEDICARE OBSERVATION STATUS NOTIFICATION   Patient Details  Name: Vicki Mcmillan MRN: [id number] Date of Birth: 06-Sep-1971   Medicare Observation Status Notification Given:  Yes  Verbally reviewed observation notice with Kimberlye Sprowl telephonically at [phone].  A copy of this document will been left with the patient.      Ashika Apuzzo 10/20/2024, 12:42 PM

## 2024-10-20 NOTE — Evaluation (Signed)
 Occupational Therapy Evaluation, Treatment, and Discharge Patient Details Name: Vicki Mcmillan MRN: [id number] DOB: 1971/01/05 Today's Date: 10/20/2024   History of Present Illness   53 y.o. female presenting 12/17 with acute resp failure with hypoxia, COPD exacerbation, Influenza A, hypokalemia. PMH: COPD, GERD, Depression, chronic headaches.     Clinical Impressions At baseline, pt is Independent with ADLs, IADLs, and functional mobility without an AD. Pt now presents with decreased activity tolerance, increased fatigue, and increased SOB affecting functional level affecting functional level and quality of life. Pt currently demonstrates ability to complete ADLs largely Independent to Mod I, requiring increased time and frequent rest breaks, and functional mobility/transfers Independently without an AD. OT provided education this session in energy conservation strategies (with handouts provided) to increase pt safety and independence with functional tasks and improve quality of life. Pt demonstrated understanding of all education through teach back and states she thinks using energy conservation strategies will be helpful for her. Pt O2 sat >/92% on 1L continuos O2 throughout session. No further benefit from acute skilled OT services at this time and no post-acute skilled OT needs are anticipated. Pt will benefit from Mobility Specialist services while in hospital and also encouraged to participate in frequent OOB activity with Nursing staff to avoid complications associated with prolonged bed rest. OT recommends use of shower chair in the home for energy conservation. Also feel pt will benefit from post-acute Pulmonary Rehab. All acute OT assessment and education complete. OT signing off at this time. Please reconsult if pt needs/condition changes.      If plan is discharge home, recommend the following:   Other (comment) (PRN assistance with cooking, home management tasks, and  transportation)     Functional Status Assessment   Patient has had a recent decline in their functional status and demonstrates the ability to make significant improvements in function in a reasonable and predictable amount of time.     Equipment Recommendations   Tub/shower seat     Recommendations for Other Services         Precautions/Restrictions   Precautions Recall of Precautions/Restrictions: Intact Precaution/Restrictions Comments: watch O2 Restrictions Weight Bearing Restrictions Per Provider Order: No     Mobility Bed Mobility Overal bed mobility: Independent                  Transfers Overall transfer level: Independent                        Balance Overall balance assessment: No apparent balance deficits (not formally assessed)                                         ADL either performed or assessed with clinical judgement   ADL Overall ADL's : Modified independent;Independent                                       General ADL Comments: Pt Independent to Mod I with ADLs, requiring frequent rest breaks due to SOB and fatigue. OT educated pt in energy conservation strategies with handouts provided. Pt verbalized and demonstrated understanding of all education through teach back and reported strategies are helpful. OT also educated pt on option of using DME, including a shower chair and reacher, for energy conservation with pt  stating understanding and openness to these options. Pt states he mother has a sports administrator she can use at home.     Vision Patient Visual Report: No change from baseline Vision Assessment?: Wears glasses for reading Additional Comments: Vision [facility] for tasks assessed     Perception         Praxis         Pertinent Vitals/Pain Pain Assessment Pain Assessment: No/denies pain     Extremity/Trunk Assessment Upper Extremity Assessment Upper Extremity Assessment: Right hand  dominant;Overall [facility] for tasks assessed   Lower Extremity Assessment Lower Extremity Assessment: Defer to PT evaluation       Communication Communication Communication: No apparent difficulties   Cognition Arousal: Alert Behavior During Therapy: WFL for tasks assessed/performed Cognition: No apparent impairments             OT - Cognition Comments: Pt AAOx4 and pleasant throughout session with cogntion [facility] for tasks assessed.                 Following commands: Intact       Cueing  General Comments   Cueing Techniques: Verbal cues  O2 sat >/92% on 1L continuos O2 throughout session.   Exercises     Shoulder Instructions      Home Living Family/patient expects to be discharged to:: Private residence Living Arrangements: Parent (mother) Available Help at Discharge: Family Type of Home: House Home Access: Stairs to enter Secretary/administrator of Steps: 4 Entrance Stairs-Rails: Right Home Layout: One level     Bathroom Shower/Tub: Producer, Television/film/video: Standard     Home Equipment: None          Prior Functioning/Environment Prior Level of Function : Independent/Modified Independent             Mobility Comments: Independent without an AD; reports no falls ADLs Comments: Independent with ADLs and IADLs; does not work    OT Problem List: Decreased activity tolerance   OT Treatment/Interventions:        OT Goals(Current goals can be found in the care plan section)   Acute Rehab OT Goals Patient Stated Goal: to recover well, be able to return to PLOF, and breathe more easily OT Goal Formulation: All assessment and education complete, DC therapy Time For Goal Achievement: 10/20/24 Potential to Achieve Goals: Good ADL Goals Additional ADL Goal #1: Patient will demonstrate ability to demonstrate understanding of education related to energy conservation strategies through teach back with handout provided to increase safety and  independence with functional tasks.   OT Frequency:       Co-evaluation              AM-PAC OT 6 Clicks Daily Activity     Outcome Measure Help from another person eating meals?: None Help from another person taking care of personal grooming?: None Help from another person toileting, which includes using toliet, bedpan, or urinal?: None Help from another person bathing (including washing, rinsing, drying)?: None Help from another person to put on and taking off regular upper body clothing?: None Help from another person to put on and taking off regular lower body clothing?: None 6 Click Score: 24   End of Session Equipment Utilized During Treatment: Oxygen  Nurse Communication: Mobility status;Other (comment) (OT provided EC strategies training. All OT-related education/training completes and OT signing off a this time.)  Activity Tolerance: Patient tolerated treatment well;Treatment limited secondary to medical complications (Comment);Patient limited by fatigue (limited by impaired cardiopulmonary status)  Patient left: in bed;with call bell/phone within reach  OT Visit Diagnosis: Other (comment) (decreased activity tolerance)                Time: 8455-8378 OT Time Calculation (min): 37 min Charges:  OT General Charges $OT Visit: 1 Visit OT Evaluation $OT Eval Low Complexity: 1 Low OT Treatments $Self Care/Home Management : 8-22 mins  Margarie Rockey HERO., OTR/L, MA Acute Rehab [phone]   Margarie FORBES Horns 10/20/2024, 5:10 PM

## 2024-10-21 DIAGNOSIS — J441 Chronic obstructive pulmonary disease with (acute) exacerbation: Secondary | ICD-10-CM | POA: Diagnosis not present

## 2024-10-21 LAB — BASIC METABOLIC PANEL WITH GFR
Anion gap: 10 (ref 5–15)
BUN: 16 mg/dL (ref 6–20)
CO2: 25 mmol/L (ref 22–32)
Calcium: 8.8 mg/dL — ABNORMAL LOW (ref 8.9–10.3)
Chloride: 107 mmol/L (ref 98–111)
Creatinine, Ser: 0.76 mg/dL (ref 0.44–1.00)
GFR, Estimated: >60 mL/min
Glucose, Bld: 116 mg/dL — ABNORMAL HIGH (ref 70–99)
Potassium: 4.3 mmol/L (ref 3.5–5.1)
Sodium: 141 mmol/L (ref 135–145)

## 2024-10-21 LAB — CBC
HCT: 36.9 % (ref 36.0–46.0)
Hemoglobin: 12.1 g/dL (ref 12.0–15.0)
MCH: 30.4 pg (ref 26.0–34.0)
MCHC: 32.8 g/dL (ref 30.0–36.0)
MCV: 92.7 fL (ref 80.0–100.0)
Platelets: 296 K/uL (ref 150–400)
RBC: 3.98 MIL/uL (ref 3.87–5.11)
RDW: 13.2 % (ref 11.5–15.5)
WBC: 6 K/uL (ref 4.0–10.5)
nRBC: 0 % (ref 0.0–0.2)

## 2024-10-21 NOTE — Progress Notes (Signed)
 " PROGRESS NOTE    Vicki Mcmillan  FMW:[id number] DOB: January 07, 1971 DOA: 10/19/2024 PCP: Teresa Aldona CROME, NP    Brief Narrative:  53 year old female with PMHx of COPD, GERD, depression, chronic headaches, current tobacco abuse who presented to the ED on 10/19/2024 with mild cough and increased work of breathing over the previous 7 to 10 days.  She was initially evaluated via virtual visit and prescribed an oral antibiotic, prednisone  taper, and PRN nebulizer treatments without improvement.  She subsequently presented to an [facility] on 12/12, was diagnosed with influenza A, and started on high-dose prednisone  and Augmentin.  Despite outpatient treatment, symptoms continue to worsen.  Patient denied any sick contacts, fevers, rhinorrhea, nasal congestion.  Smokes 0.5 PPD though has not smoked in the past 2 weeks.  In the ED, she was afebrile with a temp of 98.6 F, HR 60s to 80s, RR 20s, BP 100s over 90s.  Oxygen  saturation in the mid 80s with ambulation on RA, improved with 2 L [place].  Labs notable for WBC 7.5, Hgb 13, platelets 257, creatinine 0.85, potassium 2.9, magnesium  2.0.  CTA chest negative for pulmonary embolism or focal infiltrate but noted emphysematous changes.  The patient was admitted for acute hypoxic respiratory failure in the setting of COPD exacerbation and influenza A infection with associated hypokalemia.  Assessment and Plan:  #Acute hypoxic respiratory failure  #COPD exacerbation  #Recent influenza A infection Presented with SOB, increased worked of breathing, and acute hypoxia requiring supplemental oxygen  to maintain appropriate O2 saturation, after recent influenza A infection and failure to improve on outpatient therapy. Currently continues to require supplemental O2, and complains of dyspnea with minimal exertion as well as cough productive of green sputum. Afebrile, no leukocytosis.  - Continue PRN supplemental O2, goal SpO2 88-92% to avoid oxygen -induced  hypercapnia - Received IV Solu-Medrol  120 mg daily x 2 - Continue prednisone  40 mg daily for 4 days - Continue Rocephin  1 g daily for 5 days - Continue IS and flutter valve - Sputum culture ordered - Continue Breztri  - Continue PRN DuoNebs and albuterol  nebs - Tamiflu  discontinued given negative Flu A on RPP during this admission and outside 48 window from prior positive  # Tobacco abuse disorder - Counseled on smoking cessation  # GERD - Protonix   # Hypokalemia - resolved  # Depression - Zoloft   DVT prophylaxis: enoxaparin  (LOVENOX ) injection 40 mg Start: 10/20/24 1000   Code Status:   Code Status: Full Code  Family Communication: None  Disposition Plan: Home pending clinical improvement.  Continues to be hypoxic with DOE PT - Follow Up Recommendations: No PT follow up - PT equipment: None recommended by PT OT - Follow Up Recommendations: Other (comment) (Pulmonary Rehab) -    Level of care: Telemetry  Consultants:  None  Procedures:  None  Antimicrobials: Rocephin   Subjective: Examined at bedside.  Reports doing well this morning however was significantly short of breath after moving around her room.  Continues to have cough productive of green sputum.  Denies any chest pain or fevers.v  Objective: Vitals:   10/21/24 0001 10/21/24 0505 10/21/24 0809 10/21/24 0846  BP: 115/74 106/64 101/67   Pulse:  (!) 58 (!) 57 60  Resp:  20 18 17   Temp:  98.1 F (36.7 C) 98.2 F (36.8 C)   TempSrc:   Oral   SpO2:  97% 98%   Weight:      Height:        Intake/Output Summary (Last 24  hours) at 10/21/2024 1302 Last data filed at 10/21/2024 0900 Gross per 24 hour  Intake 340 ml  Output --  Net 340 ml   Filed Weights   10/20/24 0231  Weight: 49.9 kg    Examination:  Gen: NAD, A&Ox3 HEENT: NCAT, EOMI Neck: Supple, no JVD, no LAD CV: RRR, no murmurs Resp: normal WOB, CTAB except faint wheezing in LUL, [place] in place Abd: Soft, NTND Ext: No LE edema Skin:  Warm, dry Neuro: No focal deficits Psych: Calm, cooperative, appropriate affect    Data Reviewed: I have personally reviewed following labs and imaging studies  CBC: Recent Labs  Lab 10/14/24 2018 10/19/24 1311 10/20/24 0857 10/21/24 0524  WBC 3.8* 7.5 7.5 6.0  NEUTROABS  --  4.8  --   --   HGB 13.6 12.9 12.4 12.1  HCT 40.5 37.2 37.4 36.9  MCV 92.0 89.6 92.1 92.7  PLT 192 257 270 296   Basic Metabolic Panel: Recent Labs  Lab 10/14/24 2018 10/19/24 1311 10/20/24 0857 10/20/24 0932 10/21/24 0524  NA 143 144  --  141 141  K 4.1 2.9*  --  3.5 4.3  CL 104 108  --  107 107  CO2 24 25  --  25 25  GLUCOSE 147* 93  --  87 116*  BUN 23* 24*  --  20 16  CREATININE 1.05* 0.85  --  0.78 0.76  CALCIUM 9.6 9.2  --  9.3 8.8*  MG  --  2.0 2.3  --   --    GFR: Estimated Creatinine Clearance: 63.3 mL/min (by C-G formula based on SCr of 0.76 mg/dL). Liver Function Tests: Recent Labs  Lab 10/19/24 1311 10/20/24 0857  AST 14* 19  ALT 23 22  ALKPHOS 53 53  BILITOT 0.5 0.5  PROT 6.7 6.5  ALBUMIN 4.1 4.1   No results for input(s): LIPASE, AMYLASE in the last 168 hours. No results for input(s): AMMONIA in the last 168 hours. Coagulation Profile: No results for input(s): INR, PROTIME in the last 168 hours. Cardiac Enzymes: No results for input(s): CKTOTAL, CKMB, CKMBINDEX, TROPONINI in the last 168 hours. BNP (last 3 results) Recent Labs    10/14/24 2018  PROBNP 125.0   HbA1C: No results for input(s): HGBA1C in the last 72 hours. CBG: No results for input(s): GLUCAP in the last 168 hours. Lipid Profile: No results for input(s): CHOL, HDL, LDLCALC, TRIG, CHOLHDL, LDLDIRECT in the last 72 hours. Thyroid Function Tests: No results for input(s): TSH, T4TOTAL, FREET4, T3FREE, THYROIDAB in the last 72 hours. Anemia Panel: No results for input(s): VITAMINB12, FOLATE, FERRITIN, TIBC, IRON, RETICCTPCT in the last 72  hours. Sepsis Labs: No results for input(s): PROCALCITON, LATICACIDVEN in the last 168 hours.  Recent Results (from the past 240 hours)  Resp panel by RT-PCR (RSV, Flu A&B, Covid) Anterior Nasal Swab     Status: Abnormal   Collection Time: 10/14/24  8:52 PM   Specimen: Anterior Nasal Swab  Result Value Ref Range Status   SARS Coronavirus 2 by RT PCR NEGATIVE NEGATIVE Final    Comment: (NOTE) SARS-CoV-2 target nucleic acids are NOT DETECTED.  The SARS-CoV-2 RNA is generally detectable in upper respiratory specimens during the acute phase of infection. The lowest concentration of SARS-CoV-2 viral copies this assay can detect is 138 copies/mL. A negative result does not preclude SARS-Cov-2 infection and should not be used as the sole basis for treatment or other patient management decisions. A negative result may occur with  improper specimen collection/handling, submission of specimen other than nasopharyngeal swab, presence of viral mutation(s) within the areas targeted by this assay, and inadequate number of viral copies(<138 copies/mL). A negative result must be combined with clinical observations, patient history, and epidemiological information. The expected result is Negative.  Fact Sheet for Patients:  bloggercourse.com  Fact Sheet for Healthcare Providers:  seriousbroker.it  This test is no t yet approved or cleared by the [place]  FDA and  has been authorized for detection and/or diagnosis of SARS-CoV-2 by FDA under an Emergency Use Authorization (EUA). This EUA will remain  in effect (meaning this test can be used) for the duration of the COVID-19 declaration under Section 564(b)(1) of the Act, 21 U.S.C.section 360bbb-3(b)(1), unless the authorization is terminated  or revoked sooner.       Influenza A by PCR POSITIVE (A) NEGATIVE Final   Influenza B by PCR NEGATIVE NEGATIVE Final    Comment: (NOTE) The  Xpert Xpress SARS-CoV-2/FLU/RSV plus assay is intended as an aid in the diagnosis of influenza from Nasopharyngeal swab specimens and should not be used as a sole basis for treatment. Nasal washings and aspirates are unacceptable for Xpert Xpress SARS-CoV-2/FLU/RSV testing.  Fact Sheet for Patients: bloggercourse.com  Fact Sheet for Healthcare Providers: seriousbroker.it  This test is not yet approved or cleared by the [place]  FDA and has been authorized for detection and/or diagnosis of SARS-CoV-2 by FDA under an Emergency Use Authorization (EUA). This EUA will remain in effect (meaning this test can be used) for the duration of the COVID-19 declaration under Section 564(b)(1) of the Act, 21 U.S.C. section 360bbb-3(b)(1), unless the authorization is terminated or revoked.     Resp Syncytial Virus by PCR NEGATIVE NEGATIVE Final    Comment: (NOTE) Fact Sheet for Patients: bloggercourse.com  Fact Sheet for Healthcare Providers: seriousbroker.it  This test is not yet approved or cleared by the [place]  FDA and has been authorized for detection and/or diagnosis of SARS-CoV-2 by FDA under an Emergency Use Authorization (EUA). This EUA will remain in effect (meaning this test can be used) for the duration of the COVID-19 declaration under Section 564(b)(1) of the Act, 21 U.S.C. section 360bbb-3(b)(1), unless the authorization is terminated or revoked.  Performed at [facility], [address] [place], [place] [address]   Respiratory (~20 pathogens) panel by PCR     Status: None   Collection Time: 10/20/24  3:01 AM   Specimen: Nasopharyngeal Swab; Respiratory  Result Value Ref Range Status   Adenovirus NOT DETECTED NOT DETECTED Final   Coronavirus 229E NOT DETECTED NOT DETECTED Final    Comment: (NOTE) The Coronavirus on the Respiratory Panel, DOES  NOT test for the novel  Coronavirus (2019 nCoV)    Coronavirus HKU1 NOT DETECTED NOT DETECTED Final   Coronavirus NL63 NOT DETECTED NOT DETECTED Final   Coronavirus OC43 NOT DETECTED NOT DETECTED Final   Metapneumovirus NOT DETECTED NOT DETECTED Final   Rhinovirus / Enterovirus NOT DETECTED NOT DETECTED Final   Influenza A NOT DETECTED NOT DETECTED Final   Influenza B NOT DETECTED NOT DETECTED Final   Parainfluenza Virus 1 NOT DETECTED NOT DETECTED Final   Parainfluenza Virus 2 NOT DETECTED NOT DETECTED Final   Parainfluenza Virus 3 NOT DETECTED NOT DETECTED Final   Parainfluenza Virus 4 NOT DETECTED NOT DETECTED Final   Respiratory Syncytial Virus NOT DETECTED NOT DETECTED Final   Bordetella pertussis NOT DETECTED NOT DETECTED Final   Bordetella Parapertussis NOT DETECTED NOT DETECTED  Final   Chlamydophila pneumoniae NOT DETECTED NOT DETECTED Final   Mycoplasma pneumoniae NOT DETECTED NOT DETECTED Final    Comment: Performed at [facility], [address], [place], [place] [address]     Radiology Studies: CT Angio Chest PE W and/or Wo Contrast Result Date: 10/19/2024 EXAM: CTA CHEST 10/19/2024 03:20:00 PM TECHNIQUE: CTA of the chest was performed after the administration of intravenous contrast. Without and with IV contrast was administered. 75 mL of iohexol  (OMNIPAQUE ) 350 MG/ML injection 100 mL IOHEXOL  350 MG/ML SOLN was administered. Multiplanar reformatted images are provided for review. MIP images are provided for review. Automated exposure control, iterative reconstruction, and/or weight based adjustment of the mA/kV was utilized to reduce the radiation dose to as low as reasonably achievable. COMPARISON: CT chest 08/11/2024. CLINICAL HISTORY: COPD, SOB, hypoxia -- recent flu, eval occult PNA, r/o PE. FINDINGS: PULMONARY ARTERIES: Pulmonary arteries are adequately opacified for evaluation. No acute pulmonary embolus. Main pulmonary artery is normal in caliber. MEDIASTINUM: The  heart and pericardium demonstrate no acute abnormality. There is no acute abnormality of the thoracic aorta. LYMPH NODES: No mediastinal, hilar or axillary lymphadenopathy. LUNGS AND PLEURA: Mild emphysema present. No focal lung infiltrate. No pulmonary edema. No evidence of pleural effusion or pneumothorax. UPPER ABDOMEN: Limited images of the upper abdomen are unremarkable. SOFT TISSUES AND BONES: No acute bone or soft tissue abnormality. IMPRESSION: 1. No pulmonary embolism. 2. No focal lung infiltrate. 3. Mild emphysema. Pulmonary emphysema is an independent risk factor for lung cancer. Recommend consideration for evaluation for a low-dose CT lung cancer screening program. Electronically signed by: Greig Pique MD 10/19/2024 03:38 PM EST RP Workstation: [id number]   DG Chest 2 View Result Date: 10/19/2024 CLINICAL DATA:  Shortness of breath and recent flu. EXAM: CHEST - 2 VIEW COMPARISON:  10/14/2024 FINDINGS: Lungs are adequately inflated without focal airspace consolidation or effusion. No pneumothorax. Cardiomediastinal silhouette and remainder of the exam is unchanged. IMPRESSION: No active cardiopulmonary disease. Electronically Signed   By: Toribio Agreste M.D.   On: 10/19/2024 14:31    Scheduled Meds:  budesonide -glycopyrrolate -formoterol   2 puff Inhalation BID   enoxaparin  (LOVENOX ) injection  40 mg Subcutaneous Q24H   loratadine   10 mg Oral Daily   montelukast   10 mg Oral QHS   pantoprazole   40 mg Oral Daily   [START ON 10/22/2024] predniSONE   40 mg Oral Q breakfast   sertraline   150 mg Oral Daily   topiramate   50 mg Oral QHS   Continuous Infusions:  cefTRIAXone  (ROCEPHIN )  IV Stopped (10/20/24 2211)     Unresulted Labs (From admission, onward)     Start     Ordered   10/21/24 1303  Expectorated Sputum Assessment w Gram Stain, Rflx to Resp Cult  Once,   R        10/21/24 1302             LOS:  LOS: 1 day   Time Spent: 35 minutes  Sergey Ishler Al-Sultani, MD [facility]  If 7PM-7AM, please contact night-coverage  10/21/2024, 1:02 PM      "

## 2024-10-21 NOTE — Plan of Care (Signed)
  Problem: Clinical Measurements: Goal: Ability to maintain clinical measurements within normal limits will improve Outcome: Progressing Goal: Will remain free from infection Outcome: Progressing Goal: Diagnostic test results will improve Outcome: Progressing Goal: Respiratory complications will improve Outcome: Progressing Goal: Cardiovascular complication will be avoided Outcome: Progressing   Problem: Activity: Goal: Risk for activity intolerance will decrease Outcome: Progressing   Problem: Coping: Goal: Level of anxiety will decrease Outcome: Progressing   Problem: Elimination: Goal: Will not experience complications related to bowel motility Outcome: Progressing Goal: Will not experience complications related to urinary retention Outcome: Progressing

## 2024-10-21 NOTE — Progress Notes (Signed)
 Patient maintained O2 stat above 91% while ambulating in the hallway

## 2024-10-21 NOTE — Plan of Care (Signed)

## 2024-10-22 DIAGNOSIS — J441 Chronic obstructive pulmonary disease with (acute) exacerbation: Secondary | ICD-10-CM | POA: Diagnosis not present

## 2024-10-22 MED ORDER — SODIUM CHLORIDE 0.9 % IV SOLN
2.0000 g | INTRAVENOUS | Status: DC
  Administered 2024-10-22 – 2024-10-23 (×2): 2 g via INTRAVENOUS
  Filled 2024-10-22 (×2): qty 20

## 2024-10-22 NOTE — Plan of Care (Signed)

## 2024-10-22 NOTE — Progress Notes (Signed)
 " PROGRESS NOTE    Vicki Mcmillan  FMW:[id number] DOB: 01-25-1971 DOA: 10/19/2024 PCP: Teresa Aldona CROME, NP    Brief Narrative:  53 year old female with PMHx of COPD, GERD, depression, chronic headaches, current tobacco abuse who presented to the ED on 10/19/2024 with mild cough and increased work of breathing over the previous 7 to 10 days.  She was initially evaluated via virtual visit and prescribed an oral antibiotic, prednisone  taper, and PRN nebulizer treatments without improvement.  She subsequently presented to an [facility] on 12/12, was diagnosed with influenza A, and started on high-dose prednisone  and Augmentin.  Despite outpatient treatment, symptoms continue to worsen.  Patient denied any sick contacts, fevers, rhinorrhea, nasal congestion.  Smokes 0.5 PPD though has not smoked in the past 2 weeks.  In the ED, she was afebrile with a temp of 98.6 F, HR 60s to 80s, RR 20s, BP 100s over 90s.  Oxygen  saturation in the mid 80s with ambulation on RA, improved with 2 L [place].  Labs notable for WBC 7.5, Hgb 13, platelets 257, creatinine 0.85, potassium 2.9, magnesium  2.0.  CTA chest negative for pulmonary embolism or focal infiltrate but noted emphysematous changes.  The patient was admitted for acute hypoxic respiratory failure in the setting of COPD exacerbation and influenza A infection with associated hypokalemia.  Assessment and Plan:  #Acute hypoxic respiratory failure  #COPD exacerbation  #Recent influenza A infection Presented with SOB, increased worked of breathing, and acute hypoxia requiring supplemental oxygen  to maintain appropriate O2 saturation, after recent influenza A infection and failure to improve on outpatient therapy. Continues to have cough productive of green sputum. Afebrile, no leukocytosis.  - Received IV Solu-Medrol  120 mg daily for 2 days - Continue prednisone  40 mg daily for 4 days - Continue Rocephin  for 5 days - Continue IS and flutter valve -  Continue Breztri  - Continue PRN DuoNebs and albuterol  nebs - Sputum culture pending - Weaned down to RA today, SpO2 > 94%. Continue PRN supplemental O2, goal SpO2 88-92% to avoid oxygen -induced hypercapnia  # Tobacco abuse disorder - Counseled on smoking cessation  # GERD - Protonix   # Hypokalemia - resolved  # Depression - Zoloft   DVT prophylaxis: enoxaparin  (LOVENOX ) injection 40 mg Start: 10/20/24 1000   Code Status:   Code Status: Full Code  Family Communication: None  Disposition Plan: Home pending clinical improvement, possible discharge tomorrow if remains on RA.   PT - Follow Up Recommendations: No PT follow up - PT equipment: None recommended by PT OT - Follow Up Recommendations: Other (comment) (Pulmonary Rehab) -    Level of care: Telemetry  Consultants:  None  Procedures:  None  Antimicrobials: Rocephin   Subjective: Examined at bedside.  Reports okay this morning, felt like she was wheezing a bit more. Still coughing a bit of mucous. Was able to ambulate to the bathroom without oxygen  and was doing okay, but then got a bit nervous and put 2L [place] back on. Has some chest pain. Denies fevers and chils.   Objective: Vitals:   10/21/24 1645 10/21/24 1940 10/21/24 2053 10/22/24 0421  BP: 107/72  106/74 (!) 99/58  Pulse: 74 83 66 (!) 58  Resp: 18 16 20 20   Temp: 97.8 F (36.6 C)  98 F (36.7 C) 98.3 F (36.8 C)  TempSrc:   Oral Oral  SpO2: 95% 92% 97% 99%  Weight:      Height:        Intake/Output Summary (Last 24 hours)  at 10/22/2024 0646 Last data filed at 10/21/2024 2104 Gross per 24 hour  Intake 340 ml  Output --  Net 340 ml   Filed Weights   10/20/24 0231  Weight: 49.9 kg    Examination:  Gen: NAD, A&Ox3 HEENT: NCAT, EOMI Neck: Supple, no JVD, no LAD CV: RRR, no murmurs Resp: normal WOB, diffuse mild expiratory wheezing bilaterally Abd: Soft, NTND Ext: No LE edema Skin: Warm, dry Neuro: No focal deficits Psych: Calm,  cooperative, appropriate affect    Data Reviewed: I have personally reviewed following labs and imaging studies  CBC: Recent Labs  Lab 10/19/24 1311 10/20/24 0857 10/21/24 0524  WBC 7.5 7.5 6.0  NEUTROABS 4.8  --   --   HGB 12.9 12.4 12.1  HCT 37.2 37.4 36.9  MCV 89.6 92.1 92.7  PLT 257 270 296   Basic Metabolic Panel: Recent Labs  Lab 10/19/24 1311 10/20/24 0857 10/20/24 0932 10/21/24 0524  NA 144  --  141 141  K 2.9*  --  3.5 4.3  CL 108  --  107 107  CO2 25  --  25 25  GLUCOSE 93  --  87 116*  BUN 24*  --  20 16  CREATININE 0.85  --  0.78 0.76  CALCIUM 9.2  --  9.3 8.8*  MG 2.0 2.3  --   --    GFR: Estimated Creatinine Clearance: 63.3 mL/min (by C-G formula based on SCr of 0.76 mg/dL). Liver Function Tests: Recent Labs  Lab 10/19/24 1311 10/20/24 0857  AST 14* 19  ALT 23 22  ALKPHOS 53 53  BILITOT 0.5 0.5  PROT 6.7 6.5  ALBUMIN 4.1 4.1   No results for input(s): LIPASE, AMYLASE in the last 168 hours. No results for input(s): AMMONIA in the last 168 hours. Coagulation Profile: No results for input(s): INR, PROTIME in the last 168 hours. Cardiac Enzymes: No results for input(s): CKTOTAL, CKMB, CKMBINDEX, TROPONINI in the last 168 hours. BNP (last 3 results) Recent Labs    10/14/24 2018  PROBNP 125.0   HbA1C: No results for input(s): HGBA1C in the last 72 hours. CBG: No results for input(s): GLUCAP in the last 168 hours. Lipid Profile: No results for input(s): CHOL, HDL, LDLCALC, TRIG, CHOLHDL, LDLDIRECT in the last 72 hours. Thyroid Function Tests: No results for input(s): TSH, T4TOTAL, FREET4, T3FREE, THYROIDAB in the last 72 hours. Anemia Panel: No results for input(s): VITAMINB12, FOLATE, FERRITIN, TIBC, IRON, RETICCTPCT in the last 72 hours. Sepsis Labs: No results for input(s): PROCALCITON, LATICACIDVEN in the last 168 hours.  Recent Results (from the past 240 hours)  Resp  panel by RT-PCR (RSV, Flu A&B, Covid) Anterior Nasal Swab     Status: Abnormal   Collection Time: 10/14/24  8:52 PM   Specimen: Anterior Nasal Swab  Result Value Ref Range Status   SARS Coronavirus 2 by RT PCR NEGATIVE NEGATIVE Final    Comment: (NOTE) SARS-CoV-2 target nucleic acids are NOT DETECTED.  The SARS-CoV-2 RNA is generally detectable in upper respiratory specimens during the acute phase of infection. The lowest concentration of SARS-CoV-2 viral copies this assay can detect is 138 copies/mL. A negative result does not preclude SARS-Cov-2 infection and should not be used as the sole basis for treatment or other patient management decisions. A negative result may occur with  improper specimen collection/handling, submission of specimen other than nasopharyngeal swab, presence of viral mutation(s) within the areas targeted by this assay, and inadequate number of viral copies(<138  copies/mL). A negative result must be combined with clinical observations, patient history, and epidemiological information. The expected result is Negative.  Fact Sheet for Patients:  bloggercourse.com  Fact Sheet for Healthcare Providers:  seriousbroker.it  This test is no t yet approved or cleared by the [place]  FDA and  has been authorized for detection and/or diagnosis of SARS-CoV-2 by FDA under an Emergency Use Authorization (EUA). This EUA will remain  in effect (meaning this test can be used) for the duration of the COVID-19 declaration under Section 564(b)(1) of the Act, 21 U.S.C.section 360bbb-3(b)(1), unless the authorization is terminated  or revoked sooner.       Influenza A by PCR POSITIVE (A) NEGATIVE Final   Influenza B by PCR NEGATIVE NEGATIVE Final    Comment: (NOTE) The Xpert Xpress SARS-CoV-2/FLU/RSV plus assay is intended as an aid in the diagnosis of influenza from Nasopharyngeal swab specimens and should not be used  as a sole basis for treatment. Nasal washings and aspirates are unacceptable for Xpert Xpress SARS-CoV-2/FLU/RSV testing.  Fact Sheet for Patients: bloggercourse.com  Fact Sheet for Healthcare Providers: seriousbroker.it  This test is not yet approved or cleared by the [place]  FDA and has been authorized for detection and/or diagnosis of SARS-CoV-2 by FDA under an Emergency Use Authorization (EUA). This EUA will remain in effect (meaning this test can be used) for the duration of the COVID-19 declaration under Section 564(b)(1) of the Act, 21 U.S.C. section 360bbb-3(b)(1), unless the authorization is terminated or revoked.     Resp Syncytial Virus by PCR NEGATIVE NEGATIVE Final    Comment: (NOTE) Fact Sheet for Patients: bloggercourse.com  Fact Sheet for Healthcare Providers: seriousbroker.it  This test is not yet approved or cleared by the [place]  FDA and has been authorized for detection and/or diagnosis of SARS-CoV-2 by FDA under an Emergency Use Authorization (EUA). This EUA will remain in effect (meaning this test can be used) for the duration of the COVID-19 declaration under Section 564(b)(1) of the Act, 21 U.S.C. section 360bbb-3(b)(1), unless the authorization is terminated or revoked.  Performed at [facility], [address] [place], [place] [address]   Respiratory (~20 pathogens) panel by PCR     Status: None   Collection Time: 10/20/24  3:01 AM   Specimen: Nasopharyngeal Swab; Respiratory  Result Value Ref Range Status   Adenovirus NOT DETECTED NOT DETECTED Final   Coronavirus 229E NOT DETECTED NOT DETECTED Final    Comment: (NOTE) The Coronavirus on the Respiratory Panel, DOES NOT test for the novel  Coronavirus (2019 nCoV)    Coronavirus HKU1 NOT DETECTED NOT DETECTED Final   Coronavirus NL63 NOT DETECTED NOT DETECTED Final    Coronavirus OC43 NOT DETECTED NOT DETECTED Final   Metapneumovirus NOT DETECTED NOT DETECTED Final   Rhinovirus / Enterovirus NOT DETECTED NOT DETECTED Final   Influenza A NOT DETECTED NOT DETECTED Final   Influenza B NOT DETECTED NOT DETECTED Final   Parainfluenza Virus 1 NOT DETECTED NOT DETECTED Final   Parainfluenza Virus 2 NOT DETECTED NOT DETECTED Final   Parainfluenza Virus 3 NOT DETECTED NOT DETECTED Final   Parainfluenza Virus 4 NOT DETECTED NOT DETECTED Final   Respiratory Syncytial Virus NOT DETECTED NOT DETECTED Final   Bordetella pertussis NOT DETECTED NOT DETECTED Final   Bordetella Parapertussis NOT DETECTED NOT DETECTED Final   Chlamydophila pneumoniae NOT DETECTED NOT DETECTED Final   Mycoplasma pneumoniae NOT DETECTED NOT DETECTED Final    Comment: Performed at [facility], [address] [place], [place] [address]     Radiology Studies: No results found.   Scheduled Meds:  budesonide -glycopyrrolate -formoterol   2 puff Inhalation BID   enoxaparin  (LOVENOX ) injection  40 mg Subcutaneous Q24H   loratadine   10 mg Oral Daily   montelukast   10 mg Oral QHS   pantoprazole   40 mg Oral Daily   predniSONE   40 mg Oral Q breakfast   sertraline   150 mg Oral Daily   topiramate   50 mg Oral QHS   Continuous Infusions:  cefTRIAXone  (ROCEPHIN )  IV 1 g (10/21/24 2112)     Unresulted Labs (From admission, onward)     Start     Ordered   10/21/24 1303  Expectorated Sputum Assessment w Gram Stain, Rflx to Resp Cult  Once,   R        10/21/24 1302             LOS:  LOS: 2 days   Time Spent: 35 minutes  Milfred Krammes Al-Sultani, MD [facility]  If 7PM-7AM, please contact night-coverage  10/22/2024, 6:46 AM      "

## 2024-10-22 NOTE — Plan of Care (Signed)
  Problem: Clinical Measurements: Goal: Ability to maintain clinical measurements within normal limits will improve Outcome: Progressing Goal: Will remain free from infection Outcome: Progressing Goal: Diagnostic test results will improve Outcome: Progressing Goal: Respiratory complications will improve Outcome: Progressing Goal: Cardiovascular complication will be avoided Outcome: Progressing   Problem: Activity: Goal: Risk for activity intolerance will decrease Outcome: Progressing   Problem: Nutrition: Goal: Adequate nutrition will be maintained Outcome: Progressing   Problem: Pain Managment: Goal: General experience of comfort will improve and/or be controlled Outcome: Progressing

## 2024-10-23 DIAGNOSIS — J441 Chronic obstructive pulmonary disease with (acute) exacerbation: Secondary | ICD-10-CM | POA: Diagnosis not present

## 2024-10-23 LAB — EXPECTORATED SPUTUM ASSESSMENT W GRAM STAIN, RFLX TO RESP C

## 2024-10-23 NOTE — Plan of Care (Signed)
  Problem: Activity: Goal: Risk for activity intolerance will decrease Outcome: Progressing   Problem: Coping: Goal: Level of anxiety will decrease Outcome: Progressing   Problem: Pain Managment: Goal: General experience of comfort will improve and/or be controlled Outcome: Progressing   Problem: Safety: Goal: Ability to remain free from injury will improve Outcome: Progressing

## 2024-10-23 NOTE — Progress Notes (Signed)
 " PROGRESS NOTE    Vicki Mcmillan  FMW:[id number] DOB: [birth year] DOA: 10/19/2024 PCP: Teresa Aldona CROME, NP    Brief Narrative:  53 year old female with PMHx of COPD, GERD, depression, chronic headaches, current tobacco abuse who presented to the ED on 10/19/2024 with mild cough and increased work of breathing over the previous 7 to 10 days.  She was initially evaluated via virtual visit and prescribed an oral antibiotic, prednisone  taper, and PRN nebulizer treatments without improvement.  She subsequently presented to an [facility] on 12/12, was diagnosed with influenza A, and started on high-dose prednisone  and Augmentin.  Despite outpatient treatment, symptoms continue to worsen.  Patient denied any sick contacts, fevers, rhinorrhea, nasal congestion.  Smokes 0.5 PPD though has not smoked in the past 2 weeks.  In the ED, she was afebrile with a temp of 98.6 F, HR 60s to 80s, RR 20s, BP 100s over 90s.  Oxygen  saturation in the mid 80s with ambulation on RA, improved with 2 L [place].  Labs notable for WBC 7.5, Hgb 13, platelets 257, creatinine 0.85, potassium 2.9, magnesium  2.0.  CTA chest negative for pulmonary embolism or focal infiltrate but noted emphysematous changes.  The patient was admitted for acute hypoxic respiratory failure in the setting of COPD exacerbation and influenza A infection with associated hypokalemia.  Assessment and Plan:  #Acute hypoxic respiratory failure  #COPD exacerbation  #Recent influenza A infection Presented with SOB, increased worked of breathing, and acute hypoxia requiring supplemental oxygen  to maintain appropriate O2 saturation, after recent influenza A infection and failure to improve on outpatient therapy. Continues to have cough productive of green sputum. Afebrile, no leukocytosis.  - Received IV Solu-Medrol  120 mg daily for 2 days - Continue prednisone  40 mg daily for 4 days - Continue Rocephin  for 5 days - Continue IS and flutter valve -  Continue Breztri  - Continue PRN DuoNebs and albuterol  nebs - Sputum culture with moderate Pseudomonas aeruginosa.  Given the radiographic pneumonia, afebrile/no leukocytosis, and clinical improvement, this likely represents a recolonization rather than infection, no antipseudomonal antibiotics indicated at this time.  - Maintained on RA today  # Tobacco abuse disorder - Counseled on smoking cessation  # GERD - Protonix   # Hypokalemia - resolved  # Depression - Zoloft   DVT prophylaxis: enoxaparin  (LOVENOX ) injection 40 mg Start: 10/20/24 1000   Code Status:   Code Status: Full Code  Family Communication: None  Disposition Plan: Home, likely discharge tomorrow  PT - Follow Up Recommendations: No PT follow up - PT equipment: None recommended by PT OT - Follow Up Recommendations: Other (comment) (Pulmonary Rehab) -    Level of care: Telemetry  Consultants:  None  Procedures:  None  Antimicrobials: Rocephin   Subjective: Examined at bedside.  She is doing much better today, much less anxious about not using oxygen .  Still coughing up some sputum and reports some wheezing in the morning.  Was able to walk around the unit without dyspnea.  Would like to stay for 1 more day to make sure she is good to go tomorrow.  Objective: Vitals:   10/23/24 0406 10/23/24 0728 10/23/24 0843 10/23/24 1710  BP: 106/68 109/62  105/71  Pulse: 61 63  85  Resp: 20     Temp: 97.8 F (36.6 C) 98.3 F (36.8 C)    TempSrc: Oral     SpO2: 95% 96% 92% 96%  Weight:      Height:        Intake/Output Summary (  Last 24 hours) at 10/23/2024 1747 Last data filed at 10/23/2024 1740 Gross per 24 hour  Intake 100 ml  Output --  Net 100 ml   Filed Weights   10/20/24 0231  Weight: 49.9 kg    Examination:  Gen: NAD, A&Ox3 HEENT: NCAT, EOMI Neck: Supple, no JVD CV: RRR, no murmurs Resp: normal WOB, no expiratory wheezing noted today Abd: Soft, NTND Ext: No LE edema Skin: Warm,  dry Neuro: No focal deficits Psych: Calm, cooperative, appropriate affect    Data Reviewed: I have personally reviewed following labs and imaging studies  CBC: Recent Labs  Lab 10/19/24 1311 10/20/24 0857 10/21/24 0524  WBC 7.5 7.5 6.0  NEUTROABS 4.8  --   --   HGB 12.9 12.4 12.1  HCT 37.2 37.4 36.9  MCV 89.6 92.1 92.7  PLT 257 270 296   Basic Metabolic Panel: Recent Labs  Lab 10/19/24 1311 10/20/24 0857 10/20/24 0932 10/21/24 0524  NA 144  --  141 141  K 2.9*  --  3.5 4.3  CL 108  --  107 107  CO2 25  --  25 25  GLUCOSE 93  --  87 116*  BUN 24*  --  20 16  CREATININE 0.85  --  0.78 0.76  CALCIUM 9.2  --  9.3 8.8*  MG 2.0 2.3  --   --    GFR: Estimated Creatinine Clearance: 63.3 mL/min (by C-G formula based on SCr of 0.76 mg/dL). Liver Function Tests: Recent Labs  Lab 10/19/24 1311 10/20/24 0857  AST 14* 19  ALT 23 22  ALKPHOS 53 53  BILITOT 0.5 0.5  PROT 6.7 6.5  ALBUMIN 4.1 4.1   No results for input(s): LIPASE, AMYLASE in the last 168 hours. No results for input(s): AMMONIA in the last 168 hours. Coagulation Profile: No results for input(s): INR, PROTIME in the last 168 hours. Cardiac Enzymes: No results for input(s): CKTOTAL, CKMB, CKMBINDEX, TROPONINI in the last 168 hours. BNP (last 3 results) Recent Labs    10/14/24 2018  PROBNP 125.0   HbA1C: No results for input(s): HGBA1C in the last 72 hours. CBG: No results for input(s): GLUCAP in the last 168 hours. Lipid Profile: No results for input(s): CHOL, HDL, LDLCALC, TRIG, CHOLHDL, LDLDIRECT in the last 72 hours. Thyroid Function Tests: No results for input(s): TSH, T4TOTAL, FREET4, T3FREE, THYROIDAB in the last 72 hours. Anemia Panel: No results for input(s): VITAMINB12, FOLATE, FERRITIN, TIBC, IRON, RETICCTPCT in the last 72 hours. Sepsis Labs: No results for input(s): PROCALCITON, LATICACIDVEN in the last 168 hours.  Recent  Results (from the past 240 hours)  Resp panel by RT-PCR (RSV, Flu A&B, Covid) Anterior Nasal Swab     Status: Abnormal   Collection Time: 10/14/24  8:52 PM   Specimen: Anterior Nasal Swab  Result Value Ref Range Status   SARS Coronavirus 2 by RT PCR NEGATIVE NEGATIVE Final    Comment: (NOTE) SARS-CoV-2 target nucleic acids are NOT DETECTED.  The SARS-CoV-2 RNA is generally detectable in upper respiratory specimens during the acute phase of infection. The lowest concentration of SARS-CoV-2 viral copies this assay can detect is 138 copies/mL. A negative result does not preclude SARS-Cov-2 infection and should not be used as the sole basis for treatment or other patient management decisions. A negative result may occur with  improper specimen collection/handling, submission of specimen other than nasopharyngeal swab, presence of viral mutation(s) within the areas targeted by this assay, and inadequate number of viral  copies(<138 copies/mL). A negative result must be combined with clinical observations, patient history, and epidemiological information. The expected result is Negative.  Fact Sheet for Patients:  bloggercourse.com  Fact Sheet for Healthcare Providers:  seriousbroker.it  This test is no t yet approved or cleared by the [place]  FDA and  has been authorized for detection and/or diagnosis of SARS-CoV-2 by FDA under an Emergency Use Authorization (EUA). This EUA will remain  in effect (meaning this test can be used) for the duration of the COVID-19 declaration under Section 564(b)(1) of the Act, 21 U.S.C.section 360bbb-3(b)(1), unless the authorization is terminated  or revoked sooner.       Influenza A by PCR POSITIVE (A) NEGATIVE Final   Influenza B by PCR NEGATIVE NEGATIVE Final    Comment: (NOTE) The Xpert Xpress SARS-CoV-2/FLU/RSV plus assay is intended as an aid in the diagnosis of influenza from  Nasopharyngeal swab specimens and should not be used as a sole basis for treatment. Nasal washings and aspirates are unacceptable for Xpert Xpress SARS-CoV-2/FLU/RSV testing.  Fact Sheet for Patients: bloggercourse.com  Fact Sheet for Healthcare Providers: seriousbroker.it  This test is not yet approved or cleared by the [place]  FDA and has been authorized for detection and/or diagnosis of SARS-CoV-2 by FDA under an Emergency Use Authorization (EUA). This EUA will remain in effect (meaning this test can be used) for the duration of the COVID-19 declaration under Section 564(b)(1) of the Act, 21 U.S.C. section 360bbb-3(b)(1), unless the authorization is terminated or revoked.     Resp Syncytial Virus by PCR NEGATIVE NEGATIVE Final    Comment: (NOTE) Fact Sheet for Patients: bloggercourse.com  Fact Sheet for Healthcare Providers: seriousbroker.it  This test is not yet approved or cleared by the [place]  FDA and has been authorized for detection and/or diagnosis of SARS-CoV-2 by FDA under an Emergency Use Authorization (EUA). This EUA will remain in effect (meaning this test can be used) for the duration of the COVID-19 declaration under Section 564(b)(1) of the Act, 21 U.S.C. section 360bbb-3(b)(1), unless the authorization is terminated or revoked.  Performed at [facility], [address], [place], [place] [address]   Respiratory (~20 pathogens) panel by PCR     Status: None   Collection Time: 10/20/24  3:01 AM   Specimen: Nasopharyngeal Swab; Respiratory  Result Value Ref Range Status   Adenovirus NOT DETECTED NOT DETECTED Final   Coronavirus 229E NOT DETECTED NOT DETECTED Final    Comment: (NOTE) The Coronavirus on the Respiratory Panel, DOES NOT test for the novel  Coronavirus (2019 nCoV)    Coronavirus HKU1 NOT DETECTED NOT DETECTED  Final   Coronavirus NL63 NOT DETECTED NOT DETECTED Final   Coronavirus OC43 NOT DETECTED NOT DETECTED Final   Metapneumovirus NOT DETECTED NOT DETECTED Final   Rhinovirus / Enterovirus NOT DETECTED NOT DETECTED Final   Influenza A NOT DETECTED NOT DETECTED Final   Influenza B NOT DETECTED NOT DETECTED Final   Parainfluenza Virus 1 NOT DETECTED NOT DETECTED Final   Parainfluenza Virus 2 NOT DETECTED NOT DETECTED Final   Parainfluenza Virus 3 NOT DETECTED NOT DETECTED Final   Parainfluenza Virus 4 NOT DETECTED NOT DETECTED Final   Respiratory Syncytial Virus NOT DETECTED NOT DETECTED Final   Bordetella pertussis NOT DETECTED NOT DETECTED Final   Bordetella Parapertussis NOT DETECTED NOT DETECTED Final   Chlamydophila pneumoniae NOT DETECTED NOT DETECTED Final   Mycoplasma pneumoniae NOT DETECTED NOT DETECTED Final    Comment: Performed at [facility], [address], [place], [place] [address]  Expectorated Sputum Assessment w Gram Stain, Rflx to Resp Cult     Status: None   Collection Time: 10/21/24  1:03 PM   Specimen: Expectorated Sputum  Result Value Ref Range Status   Specimen Description EXPECTORATED SPUTUM  Final   Special Requests NONE  Final   Sputum evaluation   Final    THIS SPECIMEN IS ACCEPTABLE FOR SPUTUM CULTURE Performed at [facility], [address] [place], [place] [address]    Report Status 10/23/2024 FINAL  Final  Culture, Respiratory w Gram Stain     Status: None (Preliminary result)   Collection Time: 10/21/24  1:03 PM  Result Value Ref Range Status   Specimen Description EXPECTORATED SPUTUM  Final   Special Requests NONE Reflexed from [id number]  Final   Gram Stain   Final    ABUNDANT WBC PRESENT, PREDOMINANTLY PMN RARE SQUAMOUS EPITHELIAL CELLS PRESENT FEW GRAM NEGATIVE RODS FEW GRAM POSITIVE COCCI    Culture   Final    MODERATE PSEUDOMONAS AERUGINOSA SUSCEPTIBILITIES TO FOLLOW Performed at [facility], [address],  [place], [place] [address]    Report Status PENDING  Incomplete     Radiology Studies: No results found.   Scheduled Meds:  budesonide -glycopyrrolate -formoterol   2 puff Inhalation BID   enoxaparin  (LOVENOX ) injection  40 mg Subcutaneous Q24H   loratadine   10 mg Oral Daily   montelukast   10 mg Oral QHS   pantoprazole   40 mg Oral Daily   predniSONE   40 mg Oral Q breakfast   sertraline   150 mg Oral Daily   topiramate   50 mg Oral QHS   Continuous Infusions:  cefTRIAXone  (ROCEPHIN )  IV Stopped (10/22/24 2140)     Unresulted Labs (From admission, onward)    None        LOS:  LOS: 3 days   Time Spent: 35 minutes  Samyra Limb Al-Sultani, MD [facility]  If 7PM-7AM, please contact night-coverage  10/23/2024, 5:47 PM      "

## 2024-10-24 LAB — CULTURE, RESPIRATORY W GRAM STAIN

## 2024-10-24 NOTE — Discharge Summary (Signed)
 " Physician Discharge Summary   Patient: Vicki Mcmillan MRN: [id number] DOB: 06-Apr-1971  Admit date:     10/19/2024  Discharge date: 10/24/2024  Discharge Physician: Duffy Al-Sultani   PCP: Teresa Aldona CROME, NP   Recommendations at discharge:   Follow-up with PCP within 1 to 2 weeks of discharge, repeat CBC and BMP.  Continue efforts with smoking cessation counseling.  Discharge Diagnoses: Active Problems:   Depression   GERD (gastroesophageal reflux disease)  Principal Problem (Resolved):   COPD exacerbation (HCC) Resolved Problems:   Acute respiratory failure with hypoxia (HCC)   Hypokalemia   Headache   Hospital Course: 53 year old female with PMHx of COPD, GERD, depression, chronic headaches, current tobacco abuse who presented to the ED on 10/19/2024 with mild cough and increased work of breathing over the previous 7 to 10 days.  She was initially evaluated via virtual visit and prescribed an oral antibiotic, prednisone  taper, and PRN nebulizer treatments without improvement.  She subsequently presented to an [facility] on 12/12, was diagnosed with influenza A, and started on high-dose prednisone  and Augmentin.  Despite outpatient treatment, symptoms continue to worsen.  Patient denied any sick contacts, fevers, rhinorrhea, nasal congestion.  Smokes 0.5 PPD though has not smoked in the past 2 weeks.   In the ED, she was afebrile with a temp of 98.6 F, HR 60s to 80s, RR 20s, BP 100s over 90s.  Oxygen  saturation in the mid 80s with ambulation on RA, improved with 2 L [place].  Labs notable for WBC 7.5, Hgb 13, platelets 257, creatinine 0.85, potassium 2.9, magnesium  2.0.  CTA chest negative for pulmonary embolism or focal infiltrate but noted emphysematous changes.   The patient was admitted for acute hypoxic respiratory failure in the setting of COPD exacerbation and influenza A infection with associated hypokalemia.  #Acute hypoxic respiratory failure  #COPD exacerbation   #Recent influenza A infection The patient presented with SOB, increased WOB, and acute hypoxia requiring supplemental oxygen  to maintain appropriate O2 saturation, after recent influenza A infection and failure to improve on outpatient therapy.  She presented with cough productive of green sputum but was noted to be afebrile with no leukocytosis.  She initially received IV Solu-Medrol  120 mg for 2 days followed by prednisone  40 mg for 3 days.  She completed 5 days of Rocephin .  She was maintained on Breztri  with as needed DuoNebs and albuterol  nebs.  She was able to be weaned down to room air and maintained SpO2 greater than 96% over the course of 2 days.  Sputum cultures were collected and showed moderate Pseudomonas aeruginosa.  However, given no radiographic pneumonia, afebrile/no leukocytosis, and clinical improvement, this likely represented colonization rather than infection, and no antipseudomonal antibiotics were thought to be indicated at the time.  At the time of discharge, she is maintaining SpO2 96% on room air with resolution of SOB and wheezing. She is to follow up with her PCP for continued management of COPD. She was counseled extensively on the need for smoking cessation - PCP to continue efforts for the same.   At the time of discharge, the patient was hemodynamically stable with improved symptoms, clinically appropriate for discharge, and in no acute distress. The discharge plan, follow-up instructions, return precautions, and medication changes were reviewed in detail. The patient verbalized understanding, was in agreement with the plan, and had all questions answered prior to leaving the hospital.      Consultants: None Procedures performed: None  Disposition: Home Diet recommendation:  Diet Orders (From admission, onward)     Start     Ordered   10/20/24 0206  Diet Heart Room service appropriate? Yes; Fluid consistency: Thin  Diet effective now       Question Answer Comment   Room service appropriate? Yes   Fluid consistency: Thin      10/20/24 0205            DISCHARGE MEDICATION: Allergies as of 10/24/2024       Reactions   Lortab [hydrocodone-acetaminophen ] Nausea And Vomiting   Tomato Rash   Chantix [varenicline] Other (See Comments)   Hallucinations Nightmares   Roxicodone [oxycodone] Nausea And Vomiting, Other (See Comments)   Stomach, abdominal pain   Wellbutrin [bupropion] Other (See Comments)   Hallucinations         Medication List     STOP taking these medications    amoxicillin-clavulanate 875-125 MG tablet Commonly known as: AUGMENTIN   predniSONE  20 MG tablet Commonly known as: DELTASONE        TAKE these medications    albuterol  108 (90 Base) MCG/ACT inhaler Commonly known as: VENTOLIN  HFA INHALE 1-2 PUFFS BY MOUTH EVERY 6 HOURS AS NEEDED FOR WHEEZE OR SHORTNESS OF BREATH   Breztri  Aerosphere 160-9-4.8 MCG/ACT Aero inhaler Generic drug: budesonide -glycopyrrolate -formoterol  INHALE 2 PUFFS INTO THE LUNGS IN THE MORNING AND AT BEDTIME.   guaiFENesin  600 MG 12 hr tablet Commonly known as: Mucinex  Take 1 tablet (600 mg total) by mouth 2 (two) times daily as needed for to loosen phlegm.   ibuprofen  200 MG tablet Commonly known as: ADVIL  Take 800 mg by mouth 2 (two) times daily as needed for headache (pain).   ipratropium-albuterol  0.5-2.5 (3) MG/3ML Soln Commonly known as: DUONEB Take 3 mLs by nebulization 2 (two) times daily as needed (asthma).   loratadine  10 MG tablet Commonly known as: CLARITIN  Take 10 mg by mouth at bedtime.   montelukast  10 MG tablet Commonly known as: SINGULAIR  TAKE 1 TABLET BY MOUTH EVERYDAY AT BEDTIME   pantoprazole  40 MG tablet Commonly known as: PROTONIX  Take 1 tablet (40 mg total) by mouth daily. Office visit for further refills What changed:  when to take this additional instructions   rizatriptan  5 MG disintegrating tablet Commonly known as: MAXALT -MLT Take 5 mg by  mouth daily as needed for migraine.   sertraline  100 MG tablet Commonly known as: ZOLOFT  Take 1.5 tablets (150 mg total) by mouth daily. What changed: when to take this   topiramate  50 MG tablet Commonly known as: TOPAMAX  Take 50 mg by mouth at bedtime.         Discharge Exam: Filed Weights   10/20/24 0231  Weight: 49.9 kg   Blood pressure 108/71, pulse (!) 58, temperature 98.3 F (36.8 C), resp. rate 20, height 5' 2 (1.575 m), weight 49.9 kg, SpO2 96%.   Gen: NAD, A&Ox3 HEENT: NCAT, EOMI Neck: Supple, no JVD CV: RRR, no murmurs Resp: normal WOB, no expiratory wheezing noted today Abd: Soft, NTND Ext: No LE edema Skin: Warm, dry Neuro: No focal deficits Psych: Calm, cooperative, appropriate affect  Condition at discharge: good  The results of significant diagnostics from this hospitalization (including imaging, microbiology, ancillary and laboratory) are listed below for reference.   Imaging Studies: CT Angio Chest PE W and/or Wo Contrast Result Date: 10/19/2024 EXAM: CTA CHEST 10/19/2024 03:20:00 PM TECHNIQUE: CTA of the chest was performed after the administration of intravenous contrast. Without and with IV contrast was administered. 75 mL of iohexol  (  OMNIPAQUE ) 350 MG/ML injection 100 mL IOHEXOL  350 MG/ML SOLN was administered. Multiplanar reformatted images are provided for review. MIP images are provided for review. Automated exposure control, iterative reconstruction, and/or weight based adjustment of the mA/kV was utilized to reduce the radiation dose to as low as reasonably achievable. COMPARISON: CT chest 08/11/2024. CLINICAL HISTORY: COPD, SOB, hypoxia -- recent flu, eval occult PNA, r/o PE. FINDINGS: PULMONARY ARTERIES: Pulmonary arteries are adequately opacified for evaluation. No acute pulmonary embolus. Main pulmonary artery is normal in caliber. MEDIASTINUM: The heart and pericardium demonstrate no acute abnormality. There is no acute abnormality of the  thoracic aorta. LYMPH NODES: No mediastinal, hilar or axillary lymphadenopathy. LUNGS AND PLEURA: Mild emphysema present. No focal lung infiltrate. No pulmonary edema. No evidence of pleural effusion or pneumothorax. UPPER ABDOMEN: Limited images of the upper abdomen are unremarkable. SOFT TISSUES AND BONES: No acute bone or soft tissue abnormality. IMPRESSION: 1. No pulmonary embolism. 2. No focal lung infiltrate. 3. Mild emphysema. Pulmonary emphysema is an independent risk factor for lung cancer. Recommend consideration for evaluation for a low-dose CT lung cancer screening program. Electronically signed by: Greig Pique MD 10/19/2024 03:38 PM EST RP Workstation: [id number]   DG Chest 2 View Result Date: 10/19/2024 CLINICAL DATA:  Shortness of breath and recent flu. EXAM: CHEST - 2 VIEW COMPARISON:  10/14/2024 FINDINGS: Lungs are adequately inflated without focal airspace consolidation or effusion. No pneumothorax. Cardiomediastinal silhouette and remainder of the exam is unchanged. IMPRESSION: No active cardiopulmonary disease. Electronically Signed   By: Toribio Agreste M.D.   On: 10/19/2024 14:31   DG Chest Portable 1 View Result Date: 10/14/2024 EXAM: 1 VIEW(S) XRAY OF THE CHEST 10/14/2024 08:42:00 PM COMPARISON: CT chest 08/11/2024. CLINICAL HISTORY: sob sob FINDINGS: LUNGS AND PLEURA: Nipple shadow overlying the left lower lobe. No focal pulmonary opacity. No pleural effusion. No pneumothorax. HEART AND MEDIASTINUM: No acute abnormality of the cardiac and mediastinal silhouettes. BONES AND SOFT TISSUES: No acute osseous abnormality. IMPRESSION: 1. No acute cardiopulmonary process. Nipple shadow overlying the left lower lobe, benign etiology. Electronically signed by: Pinkie Pebbles MD 10/14/2024 08:46 PM EST RP Workstation: [id number]    Microbiology: Results for orders placed or performed during the hospital encounter of 10/19/24  Respiratory (~20 pathogens) panel by PCR     Status: None    Collection Time: 10/20/24  3:01 AM   Specimen: Nasopharyngeal Swab; Respiratory  Result Value Ref Range Status   Adenovirus NOT DETECTED NOT DETECTED Final   Coronavirus 229E NOT DETECTED NOT DETECTED Final    Comment: (NOTE) The Coronavirus on the Respiratory Panel, DOES NOT test for the novel  Coronavirus (2019 nCoV)    Coronavirus HKU1 NOT DETECTED NOT DETECTED Final   Coronavirus NL63 NOT DETECTED NOT DETECTED Final   Coronavirus OC43 NOT DETECTED NOT DETECTED Final   Metapneumovirus NOT DETECTED NOT DETECTED Final   Rhinovirus / Enterovirus NOT DETECTED NOT DETECTED Final   Influenza A NOT DETECTED NOT DETECTED Final   Influenza B NOT DETECTED NOT DETECTED Final   Parainfluenza Virus 1 NOT DETECTED NOT DETECTED Final   Parainfluenza Virus 2 NOT DETECTED NOT DETECTED Final   Parainfluenza Virus 3 NOT DETECTED NOT DETECTED Final   Parainfluenza Virus 4 NOT DETECTED NOT DETECTED Final   Respiratory Syncytial Virus NOT DETECTED NOT DETECTED Final   Bordetella pertussis NOT DETECTED NOT DETECTED Final   Bordetella Parapertussis NOT DETECTED NOT DETECTED Final   Chlamydophila pneumoniae NOT DETECTED NOT DETECTED Final   Mycoplasma pneumoniae  NOT DETECTED NOT DETECTED Final    Comment: Performed at [facility], [address] [place], [place] [address]  Expectorated Sputum Assessment w Gram Stain, Rflx to Resp Cult     Status: None   Collection Time: 10/21/24  1:03 PM   Specimen: Expectorated Sputum  Result Value Ref Range Status   Specimen Description EXPECTORATED SPUTUM  Final   Special Requests NONE  Final   Sputum evaluation   Final    THIS SPECIMEN IS ACCEPTABLE FOR SPUTUM CULTURE Performed at [facility], [address] [place], [place] [address]    Report Status 10/23/2024 FINAL  Final  Culture, Respiratory w Gram Stain     Status: None (Preliminary result)   Collection Time: 10/21/24  1:03 PM  Result Value Ref Range Status   Specimen Description  EXPECTORATED SPUTUM  Final   Special Requests NONE Reflexed from [id number]  Final   Gram Stain   Final    ABUNDANT WBC PRESENT, PREDOMINANTLY PMN RARE SQUAMOUS EPITHELIAL CELLS PRESENT FEW GRAM NEGATIVE RODS FEW GRAM POSITIVE COCCI    Culture   Final    MODERATE PSEUDOMONAS AERUGINOSA SUSCEPTIBILITIES TO FOLLOW Performed at [facility], [address], [place], [place] [address]    Report Status PENDING  Incomplete    Labs: CBC: Recent Labs  Lab 10/19/24 1311 10/20/24 0857 10/21/24 0524  WBC 7.5 7.5 6.0  NEUTROABS 4.8  --   --   HGB 12.9 12.4 12.1  HCT 37.2 37.4 36.9  MCV 89.6 92.1 92.7  PLT 257 270 296   Basic Metabolic Panel: Recent Labs  Lab 10/19/24 1311 10/20/24 0857 10/20/24 0932 10/21/24 0524  NA 144  --  141 141  K 2.9*  --  3.5 4.3  CL 108  --  107 107  CO2 25  --  25 25  GLUCOSE 93  --  87 116*  BUN 24*  --  20 16  CREATININE 0.85  --  0.78 0.76  CALCIUM 9.2  --  9.3 8.8*  MG 2.0 2.3  --   --    Liver Function Tests: Recent Labs  Lab 10/19/24 1311 10/20/24 0857  AST 14* 19  ALT 23 22  ALKPHOS 53 53  BILITOT 0.5 0.5  PROT 6.7 6.5  ALBUMIN 4.1 4.1   CBG: No results for input(s): GLUCAP in the last 168 hours.  Discharge time spent: Time Coordinating Discharge: I spent a total of 35 minutes engaged in face-to-face discussion with the patient and/or caregivers regarding the patients care, assessment, plan, and discharge disposition. Over 50% of this time was dedicated to counseling the patient on the risks and benefits of treatment options and the discharge plan, as well as coordinating post-discharge care.   Signed: Alessio Bogan Al-Sultani, MD [facility] 10/24/2024         "

## 2024-10-24 NOTE — Plan of Care (Signed)

## 2024-10-24 NOTE — Plan of Care (Signed)
  Problem: Education: Goal: Knowledge of General Education information will improve Description: Including pain rating scale, medication(s)/side effects and non-pharmacologic comfort measures Outcome: Progressing   Problem: Health Behavior/Discharge Planning: Goal: Ability to manage health-related needs will improve Outcome: Progressing   Problem: Clinical Measurements: Goal: Ability to maintain clinical measurements within normal limits will improve Outcome: Progressing   Problem: Activity: Goal: Risk for activity intolerance will decrease Outcome: Progressing   Problem: Coping: Goal: Level of anxiety will decrease Outcome: Progressing   Problem: Elimination: Goal: Will not experience complications related to bowel motility Outcome: Progressing   Problem: Pain Managment: Goal: General experience of comfort will improve and/or be controlled Outcome: Progressing   Problem: Safety: Goal: Ability to remain free from injury will improve Outcome: Progressing

## 2024-11-01 ENCOUNTER — Ambulatory Visit: Admitting: Primary Care

## 2024-11-01 ENCOUNTER — Encounter: Payer: Self-pay | Admitting: Primary Care

## 2024-11-01 VITALS — BP 122/66 | HR 84 | Temp 97.6°F | Ht 62.0 in | Wt 110.0 lb

## 2024-11-01 DIAGNOSIS — B965 Pseudomonas (aeruginosa) (mallei) (pseudomallei) as the cause of diseases classified elsewhere: Secondary | ICD-10-CM | POA: Diagnosis not present

## 2024-11-01 DIAGNOSIS — J45909 Unspecified asthma, uncomplicated: Secondary | ICD-10-CM

## 2024-11-01 DIAGNOSIS — J101 Influenza due to other identified influenza virus with other respiratory manifestations: Secondary | ICD-10-CM

## 2024-11-01 DIAGNOSIS — J9600 Acute respiratory failure, unspecified whether with hypoxia or hypercapnia: Secondary | ICD-10-CM | POA: Diagnosis not present

## 2024-11-01 DIAGNOSIS — Z87891 Personal history of nicotine dependence: Secondary | ICD-10-CM | POA: Diagnosis not present

## 2024-11-01 DIAGNOSIS — J449 Chronic obstructive pulmonary disease, unspecified: Secondary | ICD-10-CM

## 2024-11-01 DIAGNOSIS — R911 Solitary pulmonary nodule: Secondary | ICD-10-CM | POA: Diagnosis not present

## 2024-11-01 DIAGNOSIS — J4489 Other specified chronic obstructive pulmonary disease: Secondary | ICD-10-CM

## 2024-11-01 NOTE — Patient Instructions (Addendum)
" °  VISIT SUMMARY: You had a follow-up appointment today after your recent hospital stay for acute respiratory failure. We discussed your current health status, medications, and steps to manage your conditions moving forward.  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 You should continue taking Breztri , two puffs twice a day, and use your nebulizer only if you experience chest tightness or shortness of breath that doesn't improve with rest. Stay hydrated and engage in light activities, but avoid overexertion.  -RECENT ACUTE RESPIRATORY FAILURE SECONDARY TO INFLUENZA A INFECTION: You recently experienced acute respiratory failure due to the flu, which caused difficulty breathing and required hospitalization. You should rest and stay hydrated, avoid strenuous activities, and engage in light walking. Monitor your symptoms and report any persistent chest tightness or wheezing.  -PSEUDOMONAS RESPIRATORY COLONIZATION: Pseudomonas is a type of bacteria found in your sputum, but it is not causing an active infection. Its ok to continue taking Cipro as prescribed and watch for any signs of infection.  -PULMONARY NODULE, STABLE: A pulmonary nodule is a small growth in the lung. Your nodule is stable and does not require any immediate action.  -NICOTINE  DEPENDENCE, IN REMISSION: You have successfully quit smoking and have not smoked since 2023. Continue to avoid smoking, use nicotine  gum if you have cravings, and stay away from situations that may tempt you to smoke.  INSTRUCTIONS: Please continue with your current medications and follow the advice given for each condition. If you experience any new or worsening symptoms, contact our office. Your next follow-up appointment will be scheduled in three months.  Follow-up 3 months with Landry NP    "

## 2024-11-01 NOTE — Progress Notes (Signed)
 "  @Patient  ID: Vicki Mcmillan, female    DOB: 1971/01/30, 53 y.o.   MRN: [id number]  Chief Complaint  Patient presents with   Hospitalization Follow-up    Copd, respiratory failure. Complains of mild sob with exertion and fatigue     Referring provider: Teresa Aldona CROME, NP  HPI: 53 year old female, former smoker. PMH significant for COPD/asthma, lung nodule, GERD, tobacco use.   11/01/2024 Discussed the use of AI scribe software for clinical note transcription with the patient, who gave verbal consent to proceed.  History of Present Illness Vicki Mcmillan is a 53 year old female with COPD, asthma, and a lung nodule who presents for a hospital follow-up after recent admission for acute respiratory failure. She is accompanied by her niece.  She was admitted to the hospital on October 19, 2024, with a mild cough and increased work of breathing that had persisted for about seven to ten days. Initially, she visited the emergency room on October 14, 2024, where she was diagnosed with influenza A, but was sent home before receiving the test results. Despite being prescribed an oral antibiotic, prednisone , and nebulizer treatment during a virtual visit prior to admission, her symptoms did not improve, leading to her hospitalization.  During her hospital stay, she was treated with IV Solu-Medrol  for two days followed by oral prednisone . A CTA scan of the chest was performed during her hospital stay. Her oxygen  levels were in the mid-eighties upon ER admission, and she received supplemental oxygen  during her hospital stay. A sputum culture revealed pseudomonas, but she was not initially treated with anti-pseudomonal antibiotics as the chest x-ray showed no pneumonia and she was afebrile with no leukocytosis. She was later prescribed Cipro by her primary care physician after the culture results were reviewed.  She experiences persistent fatigue. She has shortness of breath upon  exertion, which improves with rest, and occasional chest tightness, which she associates with previous coughing episodes. No current cough, fever, or wheezing.  Her current medications include Breztri , two puffs twice a day, Singulair , and a nebulizer for emergency use. She recently stopped smoking two weeks prior to her hospital admission and has remained abstinent since then.   Allergies[1]  Immunization History  Administered Date(s) Administered   Influenza Inj Mdck Quad Pf 09/10/2021   Influenza Split 08/18/2017   Influenza, Mdck, Trivalent,PF 6+ MOS(egg free) 09/23/2023   Influenza,inj,Quad PF,6+ Mos 09/09/2018, 09/10/2021   Influenza-Unspecified 09/10/2020   PFIZER(Purple Top)SARS-COV-2 Vaccination 05/01/2020, 05/18/2020   Pneumococcal Polysaccharide-23 09/09/2018    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; cervical cancer   Complication of anesthesia    COPD (chronic obstructive pulmonary disease) (HCC)    Depression    GERD (gastroesophageal reflux disease)    Headache    Myocardial infarction (HCC)    Panic attacks    PONV (postoperative nausea and vomiting)     Tobacco History: Tobacco Use History[2] Counseling given: Not Answered   Outpatient Medications Prior to Visit  Medication Sig Dispense Refill   albuterol  (VENTOLIN  HFA) 108 (90 Base) MCG/ACT inhaler INHALE 1-2 PUFFS BY MOUTH EVERY 6 HOURS AS NEEDED FOR WHEEZE OR SHORTNESS OF BREATH 18 each 4   BREZTRI  AEROSPHERE 160-9-4.8 MCG/ACT AERO inhaler INHALE 2 PUFFS INTO THE LUNGS IN THE MORNING AND AT BEDTIME. 10.7 each 2   ciprofloxacin (CIPRO) 500 MG tablet Take 500 mg by mouth.  guaiFENesin  (MUCINEX ) 600 MG 12 hr tablet Take 1 tablet (600 mg total) by mouth 2 (two) times daily as needed for to loosen phlegm. 30 tablet 1   ibuprofen  (ADVIL ) 200 MG tablet Take 800 mg by mouth 2 (two) times daily as needed for headache (pain).      ipratropium-albuterol  (DUONEB) 0.5-2.5 (3) MG/3ML SOLN Take 3 mLs by nebulization 2 (two) times daily as needed (asthma). 90 mL 2   loratadine  (CLARITIN ) 10 MG tablet Take 10 mg by mouth at bedtime.     montelukast  (SINGULAIR ) 10 MG tablet TAKE 1 TABLET BY MOUTH EVERYDAY AT BEDTIME 30 tablet 5   pantoprazole  (PROTONIX ) 40 MG tablet Take 1 tablet (40 mg total) by mouth daily. Office visit for further refills (Patient taking differently: Take 40 mg by mouth at bedtime.) 30 tablet 2   rizatriptan  (MAXALT -MLT) 5 MG disintegrating tablet Take 5 mg by mouth daily as needed for migraine.     sertraline  (ZOLOFT ) 100 MG tablet Take 1.5 tablets (150 mg total) by mouth daily. (Patient taking differently: Take 150 mg by mouth at bedtime.) 45 tablet 2   topiramate  (TOPAMAX ) 50 MG tablet Take 50 mg by mouth at bedtime.     trimethoprim-polymyxin b (POLYTRIM) ophthalmic solution 1 drop.     No facility-administered medications prior to visit.    Review of Systems  Review of Systems  Constitutional: Negative.   Respiratory: Negative.     Physical Exam  BP 122/66   Pulse 84   Temp 97.6 F (36.4 C)   Ht 5' 2 (1.575 m) Comment: pt stated  Wt 110 lb (49.9 kg)   SpO2 97% Comment: ra  BMI 20.12 kg/m  Physical Exam Constitutional:      Appearance: Normal appearance. She is well-developed.  HENT:     Head: Normocephalic and atraumatic.     Mouth/Throat:     Mouth: Mucous membranes are moist.     Pharynx: Oropharynx is clear.  Eyes:     Pupils: Pupils are equal, round, and reactive to light.  Cardiovascular:     Rate and Rhythm: Normal rate and regular rhythm.     Heart sounds: Normal heart sounds. No murmur heard. Pulmonary:     Effort: Pulmonary effort is normal. No respiratory distress.     Breath sounds: Normal breath sounds. No wheezing or rhonchi.     Comments: CTA Musculoskeletal:        General: Normal range of motion.     Cervical back: Normal range of motion and neck supple.   Skin:    General: Skin is warm and dry.     Findings: No erythema or rash.  Neurological:     General: No focal deficit present.     Mental Status: She is alert and oriented to person, place, and time. Mental status is at baseline.  Psychiatric:        Mood and Affect: Mood normal.        Behavior: Behavior normal.        Thought Content: Thought content normal.        Judgment: Judgment normal.      Lab Results:  CBC    Component Value Date/Time   WBC 6.0 10/21/2024 0524   RBC 3.98 10/21/2024 0524   HGB 12.1 10/21/2024 0524   HCT 36.9 10/21/2024 0524   PLT 296 10/21/2024 0524   MCV 92.7 10/21/2024 0524   MCH 30.4 10/21/2024 0524   MCHC 32.8 10/21/2024 0524  RDW 13.2 10/21/2024 0524   LYMPHSABS 2.2 10/19/2024 1311   MONOABS 0.4 10/19/2024 1311   EOSABS 0.1 10/19/2024 1311   BASOSABS 0.0 10/19/2024 1311    BMET    Component Value Date/Time   NA 141 10/21/2024 0524   K 4.3 10/21/2024 0524   CL 107 10/21/2024 0524   CO2 25 10/21/2024 0524   GLUCOSE 116 (H) 10/21/2024 0524   BUN 16 10/21/2024 0524   CREATININE 0.76 10/21/2024 0524   CALCIUM 8.8 (L) 10/21/2024 0524   GFRNONAA >60 10/21/2024 0524   GFRAA >60 07/15/2019 1808    BNP    Component Value Date/Time   BNP 67.1 11/07/2023 0718    ProBNP    Component Value Date/Time   PROBNP 125.0 10/14/2024 2018    Imaging: CT Angio Chest PE W and/or Wo Contrast Result Date: 10/19/2024 EXAM: CTA CHEST 10/19/2024 03:20:00 PM TECHNIQUE: CTA of the chest was performed after the administration of intravenous contrast. Without and with IV contrast was administered. 75 mL of iohexol  (OMNIPAQUE ) 350 MG/ML injection 100 mL IOHEXOL  350 MG/ML SOLN was administered. Multiplanar reformatted images are provided for review. MIP images are provided for review. Automated exposure control, iterative reconstruction, and/or weight based adjustment of the mA/kV was utilized to reduce the radiation dose to as low as reasonably  achievable. COMPARISON: CT chest 08/11/2024. CLINICAL HISTORY: COPD, SOB, hypoxia -- recent flu, eval occult PNA, r/o PE. FINDINGS: PULMONARY ARTERIES: Pulmonary arteries are adequately opacified for evaluation. No acute pulmonary embolus. Main pulmonary artery is normal in caliber. MEDIASTINUM: The heart and pericardium demonstrate no acute abnormality. There is no acute abnormality of the thoracic aorta. LYMPH NODES: No mediastinal, hilar or axillary lymphadenopathy. LUNGS AND PLEURA: Mild emphysema present. No focal lung infiltrate. No pulmonary edema. No evidence of pleural effusion or pneumothorax. UPPER ABDOMEN: Limited images of the upper abdomen are unremarkable. SOFT TISSUES AND BONES: No acute bone or soft tissue abnormality. IMPRESSION: 1. No pulmonary embolism. 2. No focal lung infiltrate. 3. Mild emphysema. Pulmonary emphysema is an independent risk factor for lung cancer. Recommend consideration for evaluation for a low-dose CT lung cancer screening program. Electronically signed by: Greig Pique MD 10/19/2024 03:38 PM EST RP Workstation: [id number]   DG Chest 2 View Result Date: 10/19/2024 CLINICAL DATA:  Shortness of breath and recent flu. EXAM: CHEST - 2 VIEW COMPARISON:  10/14/2024 FINDINGS: Lungs are adequately inflated without focal airspace consolidation or effusion. No pneumothorax. Cardiomediastinal silhouette and remainder of the exam is unchanged. IMPRESSION: No active cardiopulmonary disease. Electronically Signed   By: Toribio Agreste M.D.   On: 10/19/2024 14:31   DG Chest Portable 1 View Result Date: 10/14/2024 EXAM: 1 VIEW(S) XRAY OF THE CHEST 10/14/2024 08:42:00 PM COMPARISON: CT chest 08/11/2024. CLINICAL HISTORY: sob sob FINDINGS: LUNGS AND PLEURA: Nipple shadow overlying the left lower lobe. No focal pulmonary opacity. No pleural effusion. No pneumothorax. HEART AND MEDIASTINUM: No acute abnormality of the cardiac and mediastinal silhouettes. BONES AND SOFT TISSUES: No acute  osseous abnormality. IMPRESSION: 1. No acute cardiopulmonary process. Nipple shadow overlying the left lower lobe, benign etiology. Electronically signed by: Pinkie Pebbles MD 10/14/2024 08:46 PM EST RP Workstation: [id number]     Assessment & Plan:   1. Chronic obstructive pulmonary disease, unspecified COPD type (HCC) (Primary)  2. Asthma, unspecified asthma severity, unspecified whether complicated, unspecified whether persistent  Assessment and Plan Assessment & Plan Chronic obstructive pulmonary disease (COPD) with asthma COPD with asthma, managed with Breztri  and Singulair . No significant  wheezing or chest tightness on examination. Oxygen  saturation at 97%. - Continue Breztri , two puffs twice a day. - Use nebulizer only if experiencing chest tightness or shortness of breath not relieved by rest. - Encouraged hydration and light activity, avoiding overexertion.  Recent acute respiratory failure secondary to influenza A infection Recent acute respiratory failure due to influenza A infection. Hospitalized with mild cough and increased work of breathing. Treated with IV Solu-Medrol  and prednisone . Currently experiencing fatigue, reassured patient this could take up to 3-4 weeks to resolve. No fever or leukocytosis. Chest CT showed no pneumonia or blood clot.  - Encouraged rest and hydration. - Advised against strenuous activities; recommended light walking and avoiding prolonged bed rest. - Instructed to monitor symptoms and report persistent chest tightness or wheezing.  Pseudomonas respiratory colonization Sputum culture showed Pseudomonas, but chest x-ray did not show pneumonia. Afebrile with no leukocytosis, suggesting likely colonization rather than active infection. Currently on Cipro, which is sensitive to Pseudomonas. - Ok to continue Cipro as prescribed until completed - Monitor for symptoms of active infection.  Pulmonary nodule, stable Pulmonary nodule, 5 mm, identified  on CT scan in November 2025.  Nicotine  dependence, in remission Nicotine  dependence in remission since 2023. No current need for nicotine  replacement therapy. Discussed strategies to maintain abstinence, including avoiding triggers and using nicotine  gum if needed. - Encouraged continued abstinence from smoking. - Advised to use nicotine  gum if experiencing cravings. - Recommended avoiding environments and social situations that may trigger smoking.    Vicki LELON Ferrari, NP 11/01/2024     [1]  Allergies Allergen Reactions   Lortab [Hydrocodone-Acetaminophen ] Nausea And Vomiting   Tomato Rash   Chantix [Varenicline] Other (See Comments)    Hallucinations Nightmares   Roxicodone [Oxycodone] Nausea And Vomiting and Other (See Comments)    Stomach, abdominal pain   Wellbutrin [Bupropion] Other (See Comments)    Hallucinations   [2]  Social History Tobacco Use  Smoking Status Former   Current packs/day: 0.00   Average packs/day: 0.3 packs/day for 16.1 years (4.3 ttl pk-yrs)   Types: Cigarettes   Start date: 06/10/2007   Quit date: 10/11/2024   Years since quitting: 0.0   Passive exposure: Past  Smokeless Tobacco Never   "

## 2024-11-29 ENCOUNTER — Other Ambulatory Visit

## 2024-12-05 ENCOUNTER — Emergency Department (HOSPITAL_COMMUNITY)

## 2024-12-05 ENCOUNTER — Observation Stay (HOSPITAL_COMMUNITY)
Admission: EM | Admit: 2024-12-05 | Source: Home / Self Care | Attending: Emergency Medicine | Admitting: Emergency Medicine

## 2024-12-05 ENCOUNTER — Other Ambulatory Visit: Payer: Self-pay

## 2024-12-05 ENCOUNTER — Encounter (HOSPITAL_COMMUNITY): Payer: Self-pay

## 2024-12-05 DIAGNOSIS — F32A Depression, unspecified: Secondary | ICD-10-CM | POA: Diagnosis present

## 2024-12-05 DIAGNOSIS — F419 Anxiety disorder, unspecified: Secondary | ICD-10-CM | POA: Diagnosis present

## 2024-12-05 DIAGNOSIS — J441 Chronic obstructive pulmonary disease with (acute) exacerbation: Principal | ICD-10-CM | POA: Diagnosis present

## 2024-12-05 DIAGNOSIS — B338 Other specified viral diseases: Secondary | ICD-10-CM

## 2024-12-05 DIAGNOSIS — F17200 Nicotine dependence, unspecified, uncomplicated: Secondary | ICD-10-CM | POA: Diagnosis present

## 2024-12-05 DIAGNOSIS — E876 Hypokalemia: Secondary | ICD-10-CM | POA: Diagnosis present

## 2024-12-05 LAB — COMPREHENSIVE METABOLIC PANEL WITH GFR
ALT: 50 U/L — ABNORMAL HIGH (ref 0–44)
AST: 46 U/L — ABNORMAL HIGH (ref 15–41)
Albumin: 4.5 g/dL (ref 3.5–5.0)
Alkaline Phosphatase: 63 U/L (ref 38–126)
Anion gap: 17 — ABNORMAL HIGH (ref 5–15)
BUN: 14 mg/dL (ref 6–20)
CO2: 21 mmol/L — ABNORMAL LOW (ref 22–32)
Calcium: 8.8 mg/dL — ABNORMAL LOW (ref 8.9–10.3)
Chloride: 105 mmol/L (ref 98–111)
Creatinine, Ser: 0.93 mg/dL (ref 0.44–1.00)
GFR, Estimated: >60 mL/min
Glucose, Bld: 90 mg/dL (ref 70–99)
Potassium: 3.4 mmol/L — ABNORMAL LOW (ref 3.5–5.1)
Sodium: 142 mmol/L (ref 135–145)
Total Bilirubin: 0.4 mg/dL (ref 0.0–1.2)
Total Protein: 7.5 g/dL (ref 6.5–8.1)

## 2024-12-05 LAB — CBC WITH DIFFERENTIAL/PLATELET
Abs Immature Granulocytes: 0.04 10*3/uL (ref 0.00–0.07)
Basophils Absolute: 0 10*3/uL (ref 0.0–0.1)
Basophils Relative: 1 %
Eosinophils Absolute: 0.2 10*3/uL (ref 0.0–0.5)
Eosinophils Relative: 2 %
HCT: 41.9 % (ref 36.0–46.0)
Hemoglobin: 13.6 g/dL (ref 12.0–15.0)
Immature Granulocytes: 1 %
Lymphocytes Relative: 35 %
Lymphs Abs: 2.3 10*3/uL (ref 0.7–4.0)
MCH: 30.2 pg (ref 26.0–34.0)
MCHC: 32.5 g/dL (ref 30.0–36.0)
MCV: 93.1 fL (ref 80.0–100.0)
Monocytes Absolute: 0.5 10*3/uL (ref 0.1–1.0)
Monocytes Relative: 7 %
Neutro Abs: 3.6 10*3/uL (ref 1.7–7.7)
Neutrophils Relative %: 54 %
Platelets: 185 10*3/uL (ref 150–400)
RBC: 4.5 MIL/uL (ref 3.87–5.11)
RDW: 14.3 % (ref 11.5–15.5)
WBC: 6.5 10*3/uL (ref 4.0–10.5)
nRBC: 0 % (ref 0.0–0.2)

## 2024-12-05 LAB — RESP PANEL BY RT-PCR (RSV, FLU A&B, COVID)  RVPGX2
Influenza A by PCR: NEGATIVE
Influenza B by PCR: NEGATIVE
Resp Syncytial Virus by PCR: POSITIVE — AB
SARS Coronavirus 2 by RT PCR: NEGATIVE

## 2024-12-05 LAB — LACTIC ACID, PLASMA: Lactic Acid, Venous: 1.4 mmol/L (ref 0.5–1.9)

## 2024-12-05 LAB — TROPONIN T, HIGH SENSITIVITY: Troponin T High Sensitivity: 8 ng/L (ref 0–19)

## 2024-12-05 MED ORDER — SODIUM CHLORIDE 0.9 % IV SOLN
1.0000 g | Freq: Once | INTRAVENOUS | Status: AC
  Administered 2024-12-05: 1 g via INTRAVENOUS
  Filled 2024-12-05: qty 10

## 2024-12-05 MED ORDER — ALBUTEROL SULFATE (2.5 MG/3ML) 0.083% IN NEBU
5.0000 mg/h | INHALATION_SOLUTION | RESPIRATORY_TRACT | Status: AC
  Administered 2024-12-05: 5 mg/h via RESPIRATORY_TRACT
  Filled 2024-12-05: qty 6

## 2024-12-05 MED ORDER — IPRATROPIUM-ALBUTEROL 0.5-2.5 (3) MG/3ML IN SOLN
3.0000 mL | Freq: Once | RESPIRATORY_TRACT | Status: AC
  Administered 2024-12-05: 3 mL via RESPIRATORY_TRACT
  Filled 2024-12-05: qty 6

## 2024-12-05 MED ORDER — POTASSIUM CHLORIDE CRYS ER 20 MEQ PO TBCR
40.0000 meq | EXTENDED_RELEASE_TABLET | Freq: Once | ORAL | Status: AC
  Administered 2024-12-05: 40 meq via ORAL
  Filled 2024-12-05: qty 2

## 2024-12-05 MED ORDER — AZITHROMYCIN 250 MG PO TABS
500.0000 mg | ORAL_TABLET | Freq: Once | ORAL | Status: AC
  Administered 2024-12-05: 500 mg via ORAL
  Filled 2024-12-05: qty 2

## 2024-12-05 MED ORDER — IPRATROPIUM-ALBUTEROL 0.5-2.5 (3) MG/3ML IN SOLN
3.0000 mL | Freq: Once | RESPIRATORY_TRACT | Status: AC
  Administered 2024-12-05: 3 mL via RESPIRATORY_TRACT

## 2024-12-05 MED ORDER — ENOXAPARIN SODIUM 40 MG/0.4ML IJ SOSY
40.0000 mg | PREFILLED_SYRINGE | INTRAMUSCULAR | Status: AC
  Filled 2024-12-05 (×3): qty 0.4

## 2024-12-05 NOTE — ED Triage Notes (Signed)
 Pt via [facility] c/o shortness of breath from COPD x1 day. Bilateral wheezing- 5 of albuterol - 125 solumedrol . IV 20 L AC

## 2024-12-05 NOTE — ED Provider Notes (Signed)
 " [place] EMERGENCY DEPARTMENT AT [facility] Provider Note   CSN: [id number] Arrival date & time: 12/05/24  2122     Patient presents with: Shortness of Breath   Vicki Mcmillan is a 54 y.o. female.   Patient is a 54 year old female with a past medical history of COPD who presents to the emergency department with a chief complaint of increased shortness of breath, wheezing, productive cough with green sputum for approximate past 24 hours.  Patient notes that she did take some leftover antibiotics and steroids at home.  Patient did receive albuterol  as well as Solu-Medrol  and route.  She notes that her symptoms are much worse with ambulation and improved with rest.  She denies any associated chest pain.  She has had no abdominal pain, nausea, vomiting, diarrhea.  She has had no edema to lower extremities.   Shortness of Breath Associated symptoms: cough and wheezing        Prior to Admission medications  Medication Sig Start Date End Date Taking? Authorizing Provider  albuterol  (VENTOLIN  HFA) 108 (90 Base) MCG/ACT inhaler INHALE 1-2 PUFFS BY MOUTH EVERY 6 HOURS AS NEEDED FOR WHEEZE OR SHORTNESS OF BREATH 07/28/24   Shelah Lamar RAMAN, MD  BREZTRI  AEROSPHERE 160-9-4.8 MCG/ACT AERO inhaler INHALE 2 PUFFS INTO THE LUNGS IN THE MORNING AND AT BEDTIME. 10/12/24   Hope Almarie ORN, NP  guaiFENesin  (MUCINEX ) 600 MG 12 hr tablet Take 1 tablet (600 mg total) by mouth 2 (two) times daily as needed for to loosen phlegm. 11/11/23   Hope Almarie ORN, NP  ibuprofen  (ADVIL ) 200 MG tablet Take 800 mg by mouth 2 (two) times daily as needed for headache (pain).    [provider]  ipratropium-albuterol  (DUONEB) 0.5-2.5 (3) MG/3ML SOLN Take 3 mLs by nebulization 2 (two) times daily as needed (asthma). 10/14/24 11/13/24  Curatolo, Adam, DO  loratadine  (CLARITIN ) 10 MG tablet Take 10 mg by mouth at bedtime.    [provider]  montelukast  (SINGULAIR ) 10 MG tablet TAKE 1  TABLET BY MOUTH EVERYDAY AT BEDTIME 10/17/24   Ruthell Lauraine FALCON, NP  pantoprazole  (PROTONIX ) 40 MG tablet Take 1 tablet (40 mg total) by mouth daily. Office visit for further refills Patient taking differently: Take 40 mg by mouth at bedtime. 10/17/24   McMichael, Nestor HERO, PA-C  rizatriptan  (MAXALT -MLT) 5 MG disintegrating tablet Take 5 mg by mouth daily as needed for migraine. 04/17/23   Arvil Fonda CROME, NP  sertraline  (ZOLOFT ) 100 MG tablet Take 1.5 tablets (150 mg total) by mouth daily. Patient taking differently: Take 150 mg by mouth at bedtime. 09/21/24   Arfeen, Leni DASEN, MD  topiramate  (TOPAMAX ) 50 MG tablet Take 50 mg by mouth at bedtime.    [provider]  trimethoprim-polymyxin b (POLYTRIM) ophthalmic solution 1 drop. 10/31/24   [provider]    Allergies: Lortab [hydrocodone-acetaminophen ], Tomato, Chantix [varenicline], Roxicodone [oxycodone], and Wellbutrin [bupropion]    Review of Systems  Respiratory:  Positive for cough, shortness of breath and wheezing.   All other systems reviewed and are negative.   Updated Vital Signs BP 117/81 (BP Location: Right Arm)   Pulse 96   Temp 99.6 F (37.6 C) (Oral)   Resp (!) 27   Ht 5' 1 (1.549 m)   Wt 52.2 kg   SpO2 96%   BMI 21.73 kg/m   Physical Exam Vitals and nursing note reviewed.  Constitutional:      General: She is not in acute distress.  Appearance: Normal appearance. She is not ill-appearing.  HENT:     Head: Normocephalic and atraumatic.     Nose: Nose normal.     Mouth/Throat:     Mouth: Mucous membranes are moist.  Eyes:     Extraocular Movements: Extraocular movements intact.     Conjunctiva/sclera: Conjunctivae normal.     Pupils: Pupils are equal, round, and reactive to light.  Cardiovascular:     Rate and Rhythm: Regular rhythm. Tachycardia present.     Pulses: Normal pulses.     Heart sounds: Normal heart sounds. No murmur heard.    No gallop.  Pulmonary:     Effort: Pulmonary  effort is normal. Tachypnea present.     Breath sounds: Wheezing present. No decreased breath sounds, rhonchi or rales.  Chest:     Chest wall: No tenderness.  Abdominal:     General: Abdomen is flat. Bowel sounds are normal.     Palpations: Abdomen is soft. There is no mass.     Tenderness: There is no abdominal tenderness.  Musculoskeletal:        General: Normal range of motion.     Cervical back: Normal range of motion and neck supple.     Right lower leg: No edema.     Left lower leg: No edema.  Skin:    General: Skin is warm and dry.     Findings: No rash.  Neurological:     General: No focal deficit present.     Mental Status: She is alert and oriented to person, place, and time. Mental status is at baseline.     Cranial Nerves: No cranial nerve deficit.     Motor: No weakness.  Psychiatric:        Mood and Affect: Mood normal.        Behavior: Behavior normal.        Thought Content: Thought content normal.        Judgment: Judgment normal.     (all labs ordered are listed, but only abnormal results are displayed) Labs Reviewed  CULTURE, BLOOD (ROUTINE X 2)  CULTURE, BLOOD (ROUTINE X 2)  RESP PANEL BY RT-PCR (RSV, FLU A&B, COVID)  [id number]  COMPREHENSIVE METABOLIC PANEL WITH GFR  CBC WITH DIFFERENTIAL/PLATELET  LACTIC ACID, PLASMA  LACTIC ACID, PLASMA  TROPONIN T, HIGH SENSITIVITY    EKG: EKG Interpretation Date/Time:  Monday December 05 2024 21:34:46 EST Ventricular Rate:  86 PR Interval:  120 QRS Duration:  98 QT Interval:  401 QTC Calculation: 483 R Axis:   85  Text Interpretation: Sinus rhythm Ventricular premature complex Aberrant conduction of SV complex(es) Biatrial enlargement No significant change since last tracing Confirmed by Dean Clarity [phone]) on 12/05/2024 9:43:00 PM  Radiology: [facility] Chest Port 1 View Result Date: 12/05/2024 CLINICAL DATA:  Dyspnea, cough EXAM: PORTABLE CHEST 1 VIEW COMPARISON:  10/19/2024 FINDINGS: Single frontal view of the  chest demonstrates an unremarkable cardiac silhouette. No acute airspace disease, effusion, or pneumothorax. No acute bony abnormalities. IMPRESSION: 1. No acute intrathoracic process. Electronically Signed   By: Ozell Daring M.D.   On: 12/05/2024 21:50     Procedures   Medications Ordered in the ED  cefTRIAXone  (ROCEPHIN ) 1 g in sodium chloride  0.9 % 100 mL IVPB (has no administration in time range)  ipratropium-albuterol  (DUONEB) 0.5-2.5 (3) MG/3ML nebulizer solution 3 mL (has no administration in time range)  ipratropium-albuterol  (DUONEB) 0.5-2.5 (3) MG/3ML nebulizer solution 3 mL (has no administration in time range)  azithromycin  (ZITHROMAX ) tablet 500 mg (500 mg Oral Given 12/05/24 2204)                                    Medical Decision Making Amount and/or Complexity of Data Reviewed Labs: ordered. Radiology: ordered.  Risk Prescription drug management. Decision regarding hospitalization.   This patient presents to the ED for concern of cough, shortness of breath, wheezing, this involves an extensive number of treatment options, and is a complaint that carries with it a high risk of complications and morbidity.  The differential diagnosis includes COPD, CHF, ACS, pulmonary embolus, pneumonia, sepsis, RSV, influenza, COVID-19   Co morbidities that complicate the patient evaluation  COPD, MI   Additional history obtained:  Additional history obtained from medical records External records from outside source obtained and reviewed including medical records   Lab Tests:  I Ordered, and personally interpreted labs.  The pertinent results include: No leukocytosis, no anemia, normal kidney function, mild elevation of AST and ALT, mild hypokalemia, normal lactic acid, positive RSV, negative troponin   Imaging Studies ordered:  I ordered imaging studies including chest x-ray I independently visualized and interpreted imaging which showed no acute cardiopulmonary  process I agree with the radiologist interpretation   Cardiac Monitoring: / EKG:  The patient was maintained on a cardiac monitor.  I personally viewed and interpreted the cardiac monitored which showed an underlying rhythm of: Normal sinus rhythm, no ST/T wave changes, no ischemic changes, no STEMI   Consultations Obtained:  I requested consultation with the hospitalist,  and discussed lab and imaging findings as well as pertinent plan - they recommend: Admission   Problem List / ED Course / Critical interventions / Medication management  Patient does remain stable at this time.  She continues to have ongoing wheezing, tachypnea and shortness of breath despite treatment in the emergency department.  She is RSV positive.  Low suspicion for ACS or pulmonary embolus at this point.  Patient has a normal lactic acid no leukocytosis.  Chest x-ray demonstrates no indication for consolidation.  She is requiring 2 L nasal cannula at this point and does not wear oxygen  at home.  Have discussed patient case with Dr. Shona with the hospital service who has excepted for admission. I ordered medication including DuoNeb, potassium, albuterol , Rocephin , azithromycin  for COPD exacerbation Reevaluation of the patient after these medicines showed that the patient improved I have reviewed the patients home medicines and have made adjustments as needed   Social Determinants of Health:  None   Test / Admission - Considered:  Admission     Final diagnoses:  None    ED Discharge Orders     None          Daralene Lonni BIRCH, PA-C 12/05/24 2350  "

## 2024-12-06 ENCOUNTER — Encounter (HOSPITAL_COMMUNITY): Payer: Self-pay | Admitting: Internal Medicine

## 2024-12-06 LAB — COMPREHENSIVE METABOLIC PANEL WITH GFR
ALT: 41 U/L (ref 0–44)
AST: 32 U/L (ref 15–41)
Albumin: 3.9 g/dL (ref 3.5–5.0)
Alkaline Phosphatase: 52 U/L (ref 38–126)
Anion gap: 8 (ref 5–15)
BUN: 19 mg/dL (ref 6–20)
CO2: 24 mmol/L (ref 22–32)
Calcium: 8.5 mg/dL — ABNORMAL LOW (ref 8.9–10.3)
Chloride: 109 mmol/L (ref 98–111)
Creatinine, Ser: 0.82 mg/dL (ref 0.44–1.00)
GFR, Estimated: >60 mL/min
Glucose, Bld: 116 mg/dL — ABNORMAL HIGH (ref 70–99)
Potassium: 5.1 mmol/L (ref 3.5–5.1)
Sodium: 141 mmol/L (ref 135–145)
Total Bilirubin: 0.2 mg/dL (ref 0.0–1.2)
Total Protein: 6.4 g/dL — ABNORMAL LOW (ref 6.5–8.1)

## 2024-12-06 LAB — PHOSPHORUS: Phosphorus: 3.4 mg/dL (ref 2.5–4.6)

## 2024-12-06 LAB — MAGNESIUM: Magnesium: 2 mg/dL (ref 1.7–2.4)

## 2024-12-06 MED ORDER — BUDESON-GLYCOPYRROL-FORMOTEROL 160-9-4.8 MCG/ACT IN AERO
2.0000 | INHALATION_SPRAY | Freq: Two times a day (BID) | RESPIRATORY_TRACT | Status: DC
  Administered 2024-12-06 (×2): 2 via RESPIRATORY_TRACT
  Filled 2024-12-06 (×2): qty 5.9

## 2024-12-06 MED ORDER — ACETAMINOPHEN 325 MG PO TABS
650.0000 mg | ORAL_TABLET | Freq: Four times a day (QID) | ORAL | Status: AC | PRN
  Administered 2024-12-07 – 2024-12-09 (×2): 650 mg via ORAL
  Filled 2024-12-06 (×2): qty 2

## 2024-12-06 MED ORDER — IPRATROPIUM-ALBUTEROL 0.5-2.5 (3) MG/3ML IN SOLN
3.0000 mL | Freq: Four times a day (QID) | RESPIRATORY_TRACT | Status: AC
  Administered 2024-12-06 – 2024-12-09 (×15): 3 mL via RESPIRATORY_TRACT
  Filled 2024-12-06 (×14): qty 3

## 2024-12-06 MED ORDER — SERTRALINE HCL 50 MG PO TABS
150.0000 mg | ORAL_TABLET | Freq: Every day | ORAL | Status: AC
  Administered 2024-12-06 – 2024-12-09 (×5): 150 mg via ORAL
  Filled 2024-12-06 (×5): qty 3

## 2024-12-06 MED ORDER — PROCHLORPERAZINE EDISYLATE 10 MG/2ML IJ SOLN: 5.0000 mg | Freq: Four times a day (QID) | INTRAMUSCULAR | Status: AC | PRN

## 2024-12-06 MED ORDER — POTASSIUM CHLORIDE CRYS ER 20 MEQ PO TBCR
20.0000 meq | EXTENDED_RELEASE_TABLET | Freq: Once | ORAL | Status: AC
  Administered 2024-12-06: 20 meq via ORAL
  Filled 2024-12-06: qty 1

## 2024-12-06 MED ORDER — IPRATROPIUM-ALBUTEROL 0.5-2.5 (3) MG/3ML IN SOLN: 3.0000 mL | RESPIRATORY_TRACT | Status: DC | PRN

## 2024-12-06 MED ORDER — PANTOPRAZOLE SODIUM 40 MG PO TBEC
40.0000 mg | DELAYED_RELEASE_TABLET | Freq: Every day | ORAL | Status: AC
  Administered 2024-12-06 – 2024-12-09 (×5): 40 mg via ORAL
  Filled 2024-12-06 (×5): qty 1

## 2024-12-06 MED ORDER — MELATONIN 3 MG PO TABS
6.0000 mg | ORAL_TABLET | Freq: Every evening | ORAL | Status: AC | PRN
  Administered 2024-12-08 – 2024-12-09 (×2): 6 mg via ORAL
  Filled 2024-12-06 (×2): qty 2

## 2024-12-06 MED ORDER — BUDESON-GLYCOPYRROL-FORMOTEROL 160-9-4.8 MCG/ACT IN AERO
2.0000 | INHALATION_SPRAY | Freq: Two times a day (BID) | RESPIRATORY_TRACT | Status: AC
  Administered 2024-12-07 – 2024-12-09 (×6): 2 via RESPIRATORY_TRACT

## 2024-12-06 MED ORDER — POLYETHYLENE GLYCOL 3350 17 G PO PACK: 17.0000 g | PACK | Freq: Every day | ORAL | Status: AC | PRN

## 2024-12-06 MED ORDER — METHYLPREDNISOLONE SODIUM SUCC 40 MG IJ SOLR
40.0000 mg | Freq: Every day | INTRAMUSCULAR | Status: DC
  Administered 2024-12-06 – 2024-12-07 (×2): 40 mg via INTRAVENOUS
  Filled 2024-12-06 (×2): qty 1

## 2024-12-06 MED ORDER — IPRATROPIUM-ALBUTEROL 0.5-2.5 (3) MG/3ML IN SOLN: 3.0000 mL | Freq: Four times a day (QID) | RESPIRATORY_TRACT | Status: DC

## 2024-12-06 MED ORDER — GUAIFENESIN ER 600 MG PO TB12
600.0000 mg | ORAL_TABLET | Freq: Two times a day (BID) | ORAL | Status: AC | PRN
  Administered 2024-12-06 – 2024-12-09 (×6): 600 mg via ORAL
  Filled 2024-12-06 (×6): qty 1

## 2024-12-06 MED ORDER — AZITHROMYCIN 250 MG PO TABS
500.0000 mg | ORAL_TABLET | Freq: Every day | ORAL | Status: DC
  Administered 2024-12-06 – 2024-12-09 (×4): 500 mg via ORAL
  Filled 2024-12-06 (×4): qty 2

## 2024-12-06 MED ORDER — IPRATROPIUM-ALBUTEROL 0.5-2.5 (3) MG/3ML IN SOLN: 3.0000 mL | Freq: Two times a day (BID) | RESPIRATORY_TRACT | Status: DC | PRN

## 2024-12-06 MED ORDER — MONTELUKAST SODIUM 10 MG PO TABS
10.0000 mg | ORAL_TABLET | Freq: Every day | ORAL | Status: AC
  Administered 2024-12-06 – 2024-12-09 (×5): 10 mg via ORAL
  Filled 2024-12-06 (×5): qty 1

## 2024-12-06 NOTE — Plan of Care (Signed)

## 2024-12-06 NOTE — Progress Notes (Signed)
" °  Transition of Care [facility]) Screening Note   Patient Details  Name: Vicki Mcmillan Date of Birth: [birth year]   Transition of Care [facility]) CM/SW Contact:    Hoy DELENA Bigness, LCSW Phone Number: 12/06/2024, 9:11 AM    Transition of Care Department [facility]) has reviewed patient and no TOC needs have been identified at this time. We will continue to monitor patient advancement through interdisciplinary progression rounds. If new patient transition needs arise, please place a TOC consult.    12/06/24 0911  TOC Brief Assessment  Insurance and Status Reviewed  Patient has primary care physician Yes  Home environment has been reviewed From home w/ mother  Prior level of function: Independent  Prior/Current Home Services No current home services  Social Drivers of Health Review SDOH reviewed no interventions necessary  Readmission risk has been reviewed Yes  Transition of care needs no transition of care needs at this time    "

## 2024-12-07 DIAGNOSIS — J441 Chronic obstructive pulmonary disease with (acute) exacerbation: Secondary | ICD-10-CM | POA: Diagnosis not present

## 2024-12-07 MED ORDER — ALBUTEROL SULFATE (2.5 MG/3ML) 0.083% IN NEBU
2.5000 mg | INHALATION_SOLUTION | RESPIRATORY_TRACT | Status: AC | PRN
  Administered 2024-12-09: 2.5 mg via RESPIRATORY_TRACT
  Filled 2024-12-07 (×2): qty 3

## 2024-12-07 MED ORDER — METHYLPREDNISOLONE SODIUM SUCC 40 MG IJ SOLR
40.0000 mg | Freq: Two times a day (BID) | INTRAMUSCULAR | Status: AC
  Administered 2024-12-07 – 2024-12-09 (×5): 40 mg via INTRAVENOUS
  Filled 2024-12-07 (×5): qty 1

## 2024-12-07 NOTE — Progress Notes (Signed)
 " Progress Note   Patient: Vicki Mcmillan FMW:[id number] DOB: 05/21/1971 DOA: 12/05/2024     0 DOS: the patient was seen and examined on 12/07/2024   Brief hospital course: Vicki Mcmillan is a 54 y.o. female with medical history significant for COPD, asthma, and a lung nodule, former tobacco user, quit in December 2025, who presents to the ER due to progressively worsening shortness of breath associated with a productive cough.  Patient is admitted to hospitalist service for COPD exacerbation.  Assessment and Plan: Acute hypoxic resp failure due to COPD exacerbation due to RSV -patient continues to wheeze -DuoNebs Q6 hourly and as needed. -Continue Singulair , Mucinex  scheduled. -Continue supplemental oxygen  to maintain saturation greater than 92%-- wean as able -will need home O2 eval prior to d/c  Anxiety and depression- Continue sertraline  therapy.  GERD- Continue PPI  Elevated LFTs- LFTs did improve. Avoid hepatotoxic drugs.  Hypokalemia- -resolved -AM labs  High anion gap metabolic acidosis-  -resolved    Out of bed to chair. Incentive spirometry.   DVT prophylaxis: enoxaparin  (LOVENOX ) injection 40 mg Start: 12/06/24 1000  Level of care: Med-Surg   Code Status: Full Code  Subjective: breathing better today then yesterday but still very SOB with movement mainly  Physical Exam: Vitals:   12/07/24 0215 12/07/24 0538 12/07/24 0745 12/07/24 0759  BP:  95/68    Pulse:      Resp:      Temp:  97.8 F (36.6 C)    TempSrc:  Oral    SpO2: 98% 96% 99% 100%  Weight:      Height:         General: Appearance:    Well developed, well nourished female who appears winded with movement on O2     Lungs:     On [place], tight and wheezy  Heart:    Normal heart rate.   MS:   All extremities are intact.   Neurologic:   Awake, alert, oriented x 3. No apparent focal neurological           defect.      Data Reviewed:      Latest Ref Rng & Units 12/05/2024    10:03 PM 10/21/2024    5:24 AM 10/20/2024    8:57 AM  CBC  WBC 4.0 - 10.5 K/uL 6.5  6.0  7.5   Hemoglobin 12.0 - 15.0 g/dL 86.3  87.8  87.5   Hematocrit 36.0 - 46.0 % 41.9  36.9  37.4   Platelets 150 - 400 K/uL 185  296  270       Latest Ref Rng & Units 12/06/2024    6:10 AM 12/05/2024   10:03 PM 10/21/2024    5:24 AM  BMP  Glucose 70 - 99 mg/dL 883  90  883   BUN 6 - 20 mg/dL 19  14  16    Creatinine 0.44 - 1.00 mg/dL 9.17  9.06  9.23   Sodium 135 - 145 mmol/L 141  142  141   Potassium 3.5 - 5.1 mmol/L 5.1  3.4  4.3   Chloride 98 - 111 mmol/L 109  105  107   CO2 22 - 32 mmol/L 24  21  25    Calcium 8.9 - 10.3 mg/dL 8.5  8.8  8.8    DG Chest Port 1 View Result Date: 12/05/2024 CLINICAL DATA:  Dyspnea, cough EXAM: PORTABLE CHEST 1 VIEW COMPARISON:  10/19/2024 FINDINGS: Single frontal view of the chest demonstrates an unremarkable  cardiac silhouette. No acute airspace disease, effusion, or pneumothorax. No acute bony abnormalities. IMPRESSION: 1. No acute intrathoracic process. Electronically Signed   By: Ozell Daring M.D.   On: 12/05/2024 21:50    Family Communication: Discussed with patient, understand and agree. All questions answered.  Disposition: Status is: Observation  Planned Discharge Destination: Home   Time spent: 43 minutes  Author: Brayan Votaw U Beulah Matusek, DO 12/07/2024 10:18 AM Secure chat 7am to 7pm For on call review [email or web address]    "

## 2024-12-07 NOTE — Care Management Obs Status (Signed)
 MEDICARE OBSERVATION STATUS NOTIFICATION   Patient Details  Name: Vicki Mcmillan MRN: [id number] Date of Birth: 07-09-1971   Medicare Observation Status Notification Given:  Yes    Duwaine LITTIE Ada 12/07/2024, 11:09 AM

## 2024-12-07 NOTE — Plan of Care (Signed)

## 2024-12-08 ENCOUNTER — Other Ambulatory Visit: Payer: Self-pay | Admitting: Medical Genetics

## 2024-12-08 DIAGNOSIS — Z006 Encounter for examination for normal comparison and control in clinical research program: Secondary | ICD-10-CM

## 2024-12-08 DIAGNOSIS — J441 Chronic obstructive pulmonary disease with (acute) exacerbation: Secondary | ICD-10-CM | POA: Diagnosis not present

## 2024-12-08 LAB — CBC
HCT: 36.9 % (ref 36.0–46.0)
Hemoglobin: 11.9 g/dL — ABNORMAL LOW (ref 12.0–15.0)
MCH: 30.4 pg (ref 26.0–34.0)
MCHC: 32.2 g/dL (ref 30.0–36.0)
MCV: 94.1 fL (ref 80.0–100.0)
Platelets: 195 10*3/uL (ref 150–400)
RBC: 3.92 MIL/uL (ref 3.87–5.11)
RDW: 13.8 % (ref 11.5–15.5)
WBC: 6.5 10*3/uL (ref 4.0–10.5)
nRBC: 0 % (ref 0.0–0.2)

## 2024-12-08 LAB — BASIC METABOLIC PANEL WITH GFR
Anion gap: 9 (ref 5–15)
BUN: 16 mg/dL (ref 6–20)
CO2: 27 mmol/L (ref 22–32)
Calcium: 8.8 mg/dL — ABNORMAL LOW (ref 8.9–10.3)
Chloride: 105 mmol/L (ref 98–111)
Creatinine, Ser: 0.72 mg/dL (ref 0.44–1.00)
GFR, Estimated: >60 mL/min
Glucose, Bld: 114 mg/dL — ABNORMAL HIGH (ref 70–99)
Potassium: 4.9 mmol/L (ref 3.5–5.1)
Sodium: 142 mmol/L (ref 135–145)

## 2024-12-08 MED ORDER — SALINE SPRAY 0.65 % NA SOLN
1.0000 | NASAL | Status: AC | PRN
  Filled 2024-12-08: qty 44

## 2024-12-08 NOTE — Plan of Care (Signed)

## 2024-12-08 NOTE — Progress Notes (Signed)
 Patient was weaned to room air at 1225. Around 1250, patient called out stating she was having a difficult time breathing. Upon assessment, patient was tripoded on the side of the bed with increased WOB. Patient states she had just gotten up to the bathroom. Patients oxygen  saturations were 87% on room air. Patient placed back on [facility] with saturations at 93%. Will continue to monitor.

## 2024-12-08 NOTE — TOC Initial Note (Addendum)
 Transition of Care [facility]) - Initial/Assessment Note    Patient Details  Name: Vicki Mcmillan MRN: [id number] Date of Birth: 01/21/1971  Transition [facility] CM/SW Contact:    Lucie Lunger, LCSWA Phone Number: 12/08/2024, 10:45 AM  Clinical Narrative:                 Pt is high risk for readmission. CSW spoke with pt to complete assessment. Pt states she and her mother live together. Pt states she is independent in completing her ADLs and able to drive to appointments when needed. Pt states she has not had HH. Pt does not use any DME in the home. TOC to follow.   Expected Discharge Plan: Home/Self Care Barriers to Discharge: Continued Medical Work up   Patient Goals and CMS Choice Patient states their goals for this hospitalization and ongoing recovery are:: return home CMS Medicare.gov Compare Post Acute Care list provided to:: Patient Choice offered to / list presented to : Patient      Expected Discharge Plan and Services In-house Referral: Clinical Social Work Discharge Planning Services: CM Consult   Living arrangements for the past 2 months: Single Family Home                                      Prior Living Arrangements/Services Living arrangements for the past 2 months: Single Family Home Lives with:: Parents Patient language and need for interpreter reviewed:: Yes Do you feel safe going back to the place where you live?: Yes      Need for Family Participation in Patient Care: Yes (Comment) Care giver support system in place?: Yes (comment)   Criminal Activity/Legal Involvement Pertinent to Current Situation/Hospitalization: No - Comment as needed  Activities of Daily Living   ADL Screening (condition at time of admission) Independently performs ADLs?: Yes (appropriate for developmental age) Is the patient deaf or have difficulty hearing?: No Does the patient have difficulty seeing, even when wearing glasses/contacts?: No Does the patient  have difficulty concentrating, remembering, or making decisions?: No  Permission Sought/Granted                  Emotional Assessment Appearance:: Appears stated age Attitude/Demeanor/Rapport: Engaged Affect (typically observed): Accepting Orientation: : Oriented to Self, Oriented to Place, Oriented to  Time, Oriented to Situation Alcohol / Substance Use: Not Applicable Psych Involvement: No (comment)  Admission diagnosis:  Hypokalemia [E87.6] COPD exacerbation (HCC) [J44.1] COPD with acute exacerbation (HCC) [J44.1] Infection due to respiratory syncytial virus (RSV), unspecified infection type [B33.8] Patient Active Problem List   Diagnosis Date Noted   Asthma 11/06/2023   COPD with acute exacerbation (HCC) 09/05/2023   Lung nodule 03/14/2022   Sensorineural hearing loss (SNHL) of both ears 02/12/2021   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   Tinnitus of both ears 02/12/2021   Tobacco use disorder 12/21/2018   Tobacco use 09/09/2018   Hypokalemia 09/09/2018   GERD (gastroesophageal reflux disease) 09/09/2018   Depression 09/08/2018   COPD (chronic obstructive pulmonary disease) (HCC) 09/08/2018   Anxiety 09/08/2018   GAD (generalized anxiety disorder) 07/23/2016   MDD (major depressive disorder), single episode, 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   PCP:  Teresa Aldona CROME, NP Pharmacy:   [phone] - [place], [place] - [address] [place] [address] Phone: [phone] Fax: [phone]  Social Drivers of Health (SDOH) Social History: SDOH Screenings   Food Insecurity: No Food Insecurity (12/06/2024)  Recent Concern: Food Insecurity - Food Insecurity Present (09/20/2024)   Received from [facility]  Housing: Low Risk (12/06/2024)  Transportation Needs: No Transportation Needs (12/06/2024)  Utilities: Not At Risk (12/06/2024)  Depression (PHQ2-9): Low Risk (12/10/2021)  Financial  Resource Strain: Medium Risk (09/20/2024)   Received from [facility]  Physical Activity: Inactive (09/20/2024)   Received [facility]  Social Connections: Socially Integrated (09/20/2024)   Received from [facility]  Stress: Stress Concern Present (09/20/2024)   Received from [facility]  Tobacco Use: Medium Risk (12/06/2024)   SDOH Interventions:     Readmission Risk Interventions    12/08/2024   10:43 AM  Readmission Risk Prevention Plan  Transportation Screening Complete  Home Care Screening Complete  Medication Review (RN CM) Complete

## 2024-12-08 NOTE — Progress Notes (Signed)
 " Progress Note   Patient: Vicki Mcmillan FMW:[id number] DOB: 03-Oct-1971 DOA: 12/05/2024     1 DOS: the patient was seen and examined on 12/08/2024   Brief hospital course: Vicki Mcmillan is a 54 y.o. female with medical history significant for COPD, asthma, and a lung nodule, former tobacco user, quit in December 2025, who presents to the ER due to progressively worsening shortness of breath associated with a productive cough.  Patient is admitted to hospitalist service for COPD exacerbation from RSV.   Assessment and Plan: Acute hypoxic resp failure due to COPD exacerbation due to RSV -patient continues to wheeze-- improved with increase in IV steroids-- will continue -DuoNebs Q6 hourly and albuterol  as needed. -Continue Singulair , Mucinex  scheduled. -Continue supplemental oxygen  to maintain saturation greater than 92%-- wean as able -will need home O2 eval prior to d/c  Anxiety and depression- Continue sertraline  therapy.  GERD- Continue PPI  Elevated LFTs- LFTs did improve. Avoid hepatotoxic drugs.  Hypokalemia- -resolved -AM labs  High anion gap metabolic acidosis-  -resolved    Out of bed to chair. Incentive spirometry. Wean O2  DVT prophylaxis: enoxaparin  (LOVENOX ) injection 40 mg Start: 12/06/24 1000  Level of care: Med-Surg   Code Status: Full Code  Subjective:  Had some coughing spells yesterday  Physical Exam: Vitals:   12/08/24 0330 12/08/24 0749 12/08/24 0756 12/08/24 0938  BP: 114/68     Pulse: 71     Resp: 18     Temp: 98.1 F (36.7 C)     TempSrc: Oral     SpO2: 95% 97% 100% 99%  Weight:      Height:         General: Appearance:    Well developed, well nourished female who appears winded with movement on O2     Lungs:     On [place], moving more air with less wheeze but still tight  Heart:    Normal heart rate.   MS:   All extremities are intact.   Neurologic:   Awake, alert     Data Reviewed:      Latest Ref Rng & Units  12/08/2024    4:40 AM 12/05/2024   10:03 PM 10/21/2024    5:24 AM  CBC  WBC 4.0 - 10.5 K/uL 6.5  6.5  6.0   Hemoglobin 12.0 - 15.0 g/dL 88.0  86.3  87.8   Hematocrit 36.0 - 46.0 % 36.9  41.9  36.9   Platelets 150 - 400 K/uL 195  185  296       Latest Ref Rng & Units 12/08/2024    4:40 AM 12/06/2024    6:10 AM 12/05/2024   10:03 PM  BMP  Glucose 70 - 99 mg/dL 885  883  90   BUN 6 - 20 mg/dL 16  19  14    Creatinine 0.44 - 1.00 mg/dL 9.27  9.17  9.06   Sodium 135 - 145 mmol/L 142  141  142   Potassium 3.5 - 5.1 mmol/L 4.9  5.1  3.4   Chloride 98 - 111 mmol/L 105  109  105   CO2 22 - 32 mmol/L 27  24  21    Calcium 8.9 - 10.3 mg/dL 8.8  8.5  8.8    No results found.   Family Communication: Discussed with patient, understand and agree. All questions answered.  Disposition: Status is: Observation  Planned Discharge Destination: Home 1-2 days   Time spent: 43 minutes  Author: Harlene  U Theora Vankirk, DO 12/08/2024 10:28 AM Secure chat 7am to 7pm For on call review [email or web address]    "

## 2024-12-08 NOTE — Progress Notes (Signed)
 Mobility Specialist Progress Note:    12/08/24 1400  Mobility  Activity Ambulated with assistance  Level of Assistance Independent  Assistive Device None  Distance Ambulated (ft) 140 ft  Range of Motion/Exercises Active;All extremities  Activity Response Tolerated well  Mobility Referral Yes  Mobility visit 1 Mobility  Mobility Specialist Start Time (ACUTE ONLY) 1400  Mobility Specialist Stop Time (ACUTE ONLY) 1420  Mobility Specialist Time Calculation (min) (ACUTE ONLY) 20 min   Pt received in bed, agreeable to mobility. Independently able to stand and ambulate with no AD. Tolerated well, c/o SOB throughout session and needed 3 rest breaks. SpO2 98% on 2L at rest, during ambulation SpO2 93% on 2L. Left sitting EOB, all needs met.  Makari Sanko Mobility Specialist Please contact via Special Educational Needs Teacher or  Rehab office at [phone]

## 2024-12-09 LAB — CULTURE, BLOOD (ROUTINE X 2)
Culture: NO GROWTH
Culture: NO GROWTH
Special Requests: ADEQUATE
Special Requests: ADEQUATE

## 2024-12-09 MED ORDER — MENTHOL 3 MG MT LOZG
1.0000 | LOZENGE | OROMUCOSAL | Status: AC | PRN
  Administered 2024-12-09: 3 mg via ORAL
  Filled 2024-12-09: qty 9

## 2024-12-09 MED ORDER — GUAIFENESIN-DM 100-10 MG/5ML PO SYRP
5.0000 mL | ORAL_SOLUTION | ORAL | Status: AC | PRN
  Administered 2024-12-09: 5 mL via ORAL
  Filled 2024-12-09: qty 5

## 2024-12-09 NOTE — Progress Notes (Signed)
 " Progress Note   Patient: Vicki Mcmillan FMW:[id number] DOB: [birth year] DOA: 12/05/2024     2 DOS: the patient was seen and examined on 12/09/2024   Brief hospital course: Vicki Mcmillan is a 54 y.o. female with medical history significant for COPD, asthma, and a lung nodule, former tobacco user, quit in December 2025, who presents to the ER due to progressively worsening shortness of breath associated with a productive cough.  Patient is admitted to hospitalist service for COPD exacerbation from RSV.  Slow to improve and still requiring oxygen  with exertion   Assessment and Plan: Acute hypoxic resp failure due to COPD exacerbation due to RSV -patient continues to wheeze-- improved with increase in IV steroids-- will continue -DuoNebs Q6 hourly and albuterol  as needed. -Continue Singulair , Mucinex  scheduled. -Continue supplemental oxygen  to maintain saturation greater than 92%-- wean as able -will need home O2 eval prior to d/c  Anxiety and depression- Continue sertraline  therapy.  GERD- Continue PPI  Elevated LFTs- LFTs did improve. Avoid hepatotoxic drugs.  Hypokalemia- -resolved -AM labs  High anion gap metabolic acidosis-  -resolved    Out of bed to chair. Incentive spirometry. Wean O2 as able  DVT prophylaxis: enoxaparin  (LOVENOX ) injection 40 mg Start: 12/06/24 1000  Level of care: Med-Surg   Code Status: Full Code  Subjective:  Main issue is shortness of breath with exertion-occasionally having coughing fits  Physical Exam: Vitals:   12/08/24 2027 12/08/24 2215 12/09/24 0641 12/09/24 0911  BP:  121/77 108/69   Pulse:  73 66   Resp:  18 20   Temp:  97.7 F (36.5 C) 98.1 F (36.7 C)   TempSrc:  Oral Oral   SpO2: 97% 95% 96% 93%  Weight:      Height:         General: Appearance:    Well developed, well nourished female who appears winded with movement on O2     Lungs:     On [place], not moving much air  Heart:    Normal heart rate.   MS:    All extremities are intact.   Neurologic:   Awake, alert     Data Reviewed:      Latest Ref Rng & Units 12/08/2024    4:40 AM 12/05/2024   10:03 PM 10/21/2024    5:24 AM  CBC  WBC 4.0 - 10.5 K/uL 6.5  6.5  6.0   Hemoglobin 12.0 - 15.0 g/dL 88.0  86.3  87.8   Hematocrit 36.0 - 46.0 % 36.9  41.9  36.9   Platelets 150 - 400 K/uL 195  185  296       Latest Ref Rng & Units 12/08/2024    4:40 AM 12/06/2024    6:10 AM 12/05/2024   10:03 PM  BMP  Glucose 70 - 99 mg/dL 885  883  90   BUN 6 - 20 mg/dL 16  19  14    Creatinine 0.44 - 1.00 mg/dL 9.27  9.17  9.06   Sodium 135 - 145 mmol/L 142  141  142   Potassium 3.5 - 5.1 mmol/L 4.9  5.1  3.4   Chloride 98 - 111 mmol/L 105  109  105   CO2 22 - 32 mmol/L 27  24  21    Calcium 8.9 - 10.3 mg/dL 8.8  8.5  8.8    No results found.    Disposition: Status is: Observation  Planned Discharge Destination: Home 1-2 days-still requiring IV steroids and  oxygen    Time spent: 43 minutes  Author: Airen Stiehl U Pharell Rolfson, DO 12/09/2024 1:09 PM Secure chat 7am to 7pm For on call review [email or web address]    "

## 2024-12-09 NOTE — Care Management Important Message (Signed)
 Important Message  Patient Details  Name: Vicki Mcmillan MRN: [id number] Date of Birth: 1970/11/28   Important Message Given:  Yes - Medicare IM     Sequoya Hogsett L Starlyn Droge 12/09/2024, 3:37 PM

## 2024-12-09 NOTE — Plan of Care (Signed)
  Problem: Education: Goal: Knowledge of General Education information will improve Description: Including pain rating scale, medication(s)/side effects and non-pharmacologic comfort measures Outcome: Progressing   Problem: Health Behavior/Discharge Planning: Goal: Ability to manage health-related needs will improve Outcome: Progressing   Problem: Clinical Measurements: Goal: Will remain free from infection Outcome: Not Progressing   

## 2024-12-21 ENCOUNTER — Telehealth (HOSPITAL_COMMUNITY): Admitting: Psychiatry

## 2025-01-30 ENCOUNTER — Ambulatory Visit: Admitting: Primary Care
